# Patient Record
Sex: Female | Born: 1940 | Race: White | Hispanic: No | Smoking: Former smoker
Health system: Southern US, Community
[De-identification: ages and names within clinical notes are randomized; demographics above are authoritative.]

## PROBLEM LIST (undated history)

## (undated) DIAGNOSIS — I471 Supraventricular tachycardia, unspecified: Secondary | ICD-10-CM

## (undated) DIAGNOSIS — D72819 Decreased white blood cell count, unspecified: Secondary | ICD-10-CM

## (undated) DIAGNOSIS — I82409 Acute embolism and thrombosis of unspecified deep veins of unspecified lower extremity: Secondary | ICD-10-CM

## (undated) DIAGNOSIS — I2510 Atherosclerotic heart disease of native coronary artery without angina pectoris: Secondary | ICD-10-CM

## (undated) DIAGNOSIS — I350 Nonrheumatic aortic (valve) stenosis: Secondary | ICD-10-CM

## (undated) DIAGNOSIS — R55 Syncope and collapse: Secondary | ICD-10-CM

## (undated) DIAGNOSIS — G629 Polyneuropathy, unspecified: Secondary | ICD-10-CM

## (undated) DIAGNOSIS — D692 Other nonthrombocytopenic purpura: Secondary | ICD-10-CM

## (undated) DIAGNOSIS — F329 Major depressive disorder, single episode, unspecified: Secondary | ICD-10-CM

## (undated) DIAGNOSIS — G4730 Sleep apnea, unspecified: Secondary | ICD-10-CM

## (undated) DIAGNOSIS — IMO0001 Reserved for inherently not codable concepts without codable children: Secondary | ICD-10-CM

## (undated) DIAGNOSIS — R739 Hyperglycemia, unspecified: Secondary | ICD-10-CM

## (undated) DIAGNOSIS — J449 Chronic obstructive pulmonary disease, unspecified: Secondary | ICD-10-CM

## (undated) DIAGNOSIS — I615 Nontraumatic intracerebral hemorrhage, intraventricular: Secondary | ICD-10-CM

## (undated) DIAGNOSIS — C801 Malignant (primary) neoplasm, unspecified: Secondary | ICD-10-CM

## (undated) DIAGNOSIS — F32A Depression, unspecified: Secondary | ICD-10-CM

## (undated) DIAGNOSIS — H25019 Cortical age-related cataract, unspecified eye: Secondary | ICD-10-CM

## (undated) DIAGNOSIS — I10 Essential (primary) hypertension: Secondary | ICD-10-CM

## (undated) DIAGNOSIS — R609 Edema, unspecified: Secondary | ICD-10-CM

## (undated) DIAGNOSIS — D696 Thrombocytopenia, unspecified: Secondary | ICD-10-CM

## (undated) DIAGNOSIS — M1990 Unspecified osteoarthritis, unspecified site: Secondary | ICD-10-CM

## (undated) HISTORY — PX: EYE SURGERY: SHX253

## (undated) HISTORY — DX: Polyneuropathy, unspecified: G62.9

## (undated) HISTORY — DX: Other nonthrombocytopenic purpura: D69.2

## (undated) HISTORY — DX: Nonrheumatic aortic (valve) stenosis: I35.0

## (undated) HISTORY — PX: COLONOSCOPY: SHX174

## (undated) HISTORY — DX: Syncope and collapse: R55

## (undated) HISTORY — DX: Supraventricular tachycardia, unspecified: I47.10

## (undated) HISTORY — DX: Nontraumatic intracerebral hemorrhage, intraventricular: I61.5

## (undated) HISTORY — PX: BREAST BIOPSY: SHX20

## (undated) HISTORY — PX: BACK SURGERY: SHX140

## (undated) HISTORY — PX: CORONARY ANGIOPLASTY: SHX604

---

## 2004-07-08 ENCOUNTER — Inpatient Hospital Stay: Payer: Self-pay | Admitting: Cardiology

## 2004-07-08 ENCOUNTER — Other Ambulatory Visit: Payer: Self-pay

## 2004-08-03 ENCOUNTER — Encounter: Payer: Self-pay | Admitting: Cardiology

## 2004-08-10 ENCOUNTER — Ambulatory Visit: Payer: Self-pay | Admitting: Internal Medicine

## 2005-08-24 ENCOUNTER — Ambulatory Visit: Payer: Self-pay | Admitting: Internal Medicine

## 2006-09-13 ENCOUNTER — Ambulatory Visit: Payer: Self-pay | Admitting: Internal Medicine

## 2007-04-25 ENCOUNTER — Ambulatory Visit: Payer: Self-pay | Admitting: Internal Medicine

## 2007-05-24 ENCOUNTER — Ambulatory Visit: Payer: Self-pay | Admitting: Internal Medicine

## 2007-12-20 ENCOUNTER — Ambulatory Visit: Payer: Self-pay | Admitting: Internal Medicine

## 2009-01-09 ENCOUNTER — Ambulatory Visit: Payer: Self-pay | Admitting: Internal Medicine

## 2009-05-21 ENCOUNTER — Inpatient Hospital Stay: Payer: Self-pay | Admitting: Internal Medicine

## 2009-05-21 ENCOUNTER — Ambulatory Visit: Payer: Self-pay | Admitting: Internal Medicine

## 2010-04-22 ENCOUNTER — Ambulatory Visit: Payer: Self-pay | Admitting: Internal Medicine

## 2010-05-13 ENCOUNTER — Ambulatory Visit: Payer: Self-pay | Admitting: Internal Medicine

## 2011-06-22 ENCOUNTER — Ambulatory Visit: Payer: Self-pay | Admitting: Internal Medicine

## 2011-06-24 ENCOUNTER — Ambulatory Visit: Payer: Self-pay | Admitting: Internal Medicine

## 2012-01-02 ENCOUNTER — Ambulatory Visit: Payer: Self-pay | Admitting: General Surgery

## 2012-02-09 ENCOUNTER — Ambulatory Visit: Payer: Self-pay | Admitting: Cardiology

## 2012-02-09 LAB — PROTIME-INR: INR: 1

## 2012-06-28 ENCOUNTER — Ambulatory Visit: Payer: Self-pay | Admitting: Internal Medicine

## 2012-09-21 ENCOUNTER — Ambulatory Visit: Payer: Self-pay | Admitting: Gastroenterology

## 2012-11-09 ENCOUNTER — Ambulatory Visit: Payer: Self-pay | Admitting: Internal Medicine

## 2013-07-03 ENCOUNTER — Ambulatory Visit: Payer: Self-pay | Admitting: Internal Medicine

## 2013-08-02 ENCOUNTER — Ambulatory Visit: Payer: Self-pay | Admitting: Internal Medicine

## 2014-03-03 ENCOUNTER — Ambulatory Visit: Payer: Self-pay | Admitting: Internal Medicine

## 2014-04-17 ENCOUNTER — Emergency Department: Payer: Self-pay | Admitting: Emergency Medicine

## 2014-04-17 LAB — CBC
HCT: 46.8 % (ref 35.0–47.0)
HGB: 15.6 g/dL (ref 12.0–16.0)
MCH: 31.6 pg (ref 26.0–34.0)
MCHC: 33.2 g/dL (ref 32.0–36.0)
MCV: 95 fL (ref 80–100)
PLATELETS: 136 10*3/uL — AB (ref 150–440)
RBC: 4.92 10*6/uL (ref 3.80–5.20)
RDW: 12.8 % (ref 11.5–14.5)
WBC: 15.8 10*3/uL — ABNORMAL HIGH (ref 3.6–11.0)

## 2014-04-17 LAB — BASIC METABOLIC PANEL
Anion Gap: 11 (ref 7–16)
BUN: 16 mg/dL (ref 7–18)
CALCIUM: 9.4 mg/dL (ref 8.5–10.1)
CREATININE: 1.11 mg/dL (ref 0.60–1.30)
Chloride: 99 mmol/L (ref 98–107)
Co2: 25 mmol/L (ref 21–32)
EGFR (Non-African Amer.): 49 — ABNORMAL LOW
GFR CALC AF AMER: 57 — AB
Glucose: 142 mg/dL — ABNORMAL HIGH (ref 65–99)
Osmolality: 274 (ref 275–301)
Potassium: 3.7 mmol/L (ref 3.5–5.1)
Sodium: 135 mmol/L — ABNORMAL LOW (ref 136–145)

## 2014-04-21 ENCOUNTER — Inpatient Hospital Stay: Payer: Self-pay | Admitting: Internal Medicine

## 2014-04-21 LAB — COMPREHENSIVE METABOLIC PANEL
ALK PHOS: 63 U/L
ANION GAP: 7 (ref 7–16)
AST: 28 U/L (ref 15–37)
Albumin: 2.9 g/dL — ABNORMAL LOW (ref 3.4–5.0)
BILIRUBIN TOTAL: 0.4 mg/dL (ref 0.2–1.0)
BUN: 20 mg/dL — ABNORMAL HIGH (ref 7–18)
CALCIUM: 9.3 mg/dL (ref 8.5–10.1)
CREATININE: 1.18 mg/dL (ref 0.60–1.30)
Chloride: 103 mmol/L (ref 98–107)
Co2: 29 mmol/L (ref 21–32)
EGFR (Non-African Amer.): 45 — ABNORMAL LOW
GFR CALC AF AMER: 53 — AB
Glucose: 96 mg/dL (ref 65–99)
OSMOLALITY: 280 (ref 275–301)
POTASSIUM: 4.2 mmol/L (ref 3.5–5.1)
SGPT (ALT): 27 U/L
Sodium: 139 mmol/L (ref 136–145)
TOTAL PROTEIN: 7.2 g/dL (ref 6.4–8.2)

## 2014-04-21 LAB — URINALYSIS, COMPLETE
Bacteria: NONE SEEN
Bilirubin,UR: NEGATIVE
Blood: NEGATIVE
Glucose,UR: NEGATIVE mg/dL
Ketone: NEGATIVE
Leukocyte Esterase: NEGATIVE
Nitrite: NEGATIVE
Ph: 5
Protein: NEGATIVE
RBC,UR: <1 /HPF
Specific Gravity: 1.018
Squamous Epithelial: NONE SEEN
WBC UR: <1 /HPF

## 2014-04-21 LAB — CBC WITH DIFFERENTIAL/PLATELET
BASOS ABS: 0 10*3/uL (ref 0.0–0.1)
Basophil %: 0.6 %
EOS PCT: 1.9 %
Eosinophil #: 0.1 10*3/uL (ref 0.0–0.7)
HCT: 39.6 % (ref 35.0–47.0)
HGB: 13.2 g/dL (ref 12.0–16.0)
LYMPHS ABS: 1.4 10*3/uL (ref 1.0–3.6)
Lymphocyte %: 21.4 %
MCH: 32.3 pg (ref 26.0–34.0)
MCHC: 33.4 g/dL (ref 32.0–36.0)
MCV: 97 fL (ref 80–100)
Monocyte #: 0.8 x10 3/mm (ref 0.2–0.9)
Monocyte %: 11.9 %
NEUTROS PCT: 64.2 %
Neutrophil #: 4.3 10*3/uL (ref 1.4–6.5)
Platelet: 158 10*3/uL (ref 150–440)
RBC: 4.09 10*6/uL (ref 3.80–5.20)
RDW: 12.9 % (ref 11.5–14.5)
WBC: 6.7 10*3/uL (ref 3.6–11.0)

## 2014-04-21 LAB — PROTIME-INR
INR: 1.6
Prothrombin Time: 18.3 s — ABNORMAL HIGH

## 2014-04-22 LAB — CBC WITH DIFFERENTIAL/PLATELET
BASOS ABS: 0.1 10*3/uL (ref 0.0–0.1)
Basophil %: 0.9 %
EOS ABS: 0.2 10*3/uL (ref 0.0–0.7)
EOS PCT: 2.3 %
HCT: 39.7 % (ref 35.0–47.0)
HGB: 13.5 g/dL (ref 12.0–16.0)
LYMPHS ABS: 1.6 10*3/uL (ref 1.0–3.6)
LYMPHS PCT: 24.1 %
MCH: 32.6 pg (ref 26.0–34.0)
MCHC: 34 g/dL (ref 32.0–36.0)
MCV: 96 fL (ref 80–100)
MONO ABS: 0.8 x10 3/mm (ref 0.2–0.9)
MONOS PCT: 11.8 %
Neutrophil #: 4.1 10*3/uL (ref 1.4–6.5)
Neutrophil %: 60.9 %
Platelet: 151 10*3/uL (ref 150–440)
RBC: 4.14 10*6/uL (ref 3.80–5.20)
RDW: 12.6 % (ref 11.5–14.5)
WBC: 6.8 10*3/uL (ref 3.6–11.0)

## 2014-04-22 LAB — BASIC METABOLIC PANEL
ANION GAP: 10 (ref 7–16)
BUN: 16 mg/dL (ref 7–18)
CREATININE: 1.14 mg/dL (ref 0.60–1.30)
Calcium, Total: 9.6 mg/dL (ref 8.5–10.1)
Chloride: 101 mmol/L (ref 98–107)
Co2: 27 mmol/L (ref 21–32)
GFR CALC AF AMER: 55 — AB
GFR CALC NON AF AMER: 47 — AB
Glucose: 120 mg/dL — ABNORMAL HIGH (ref 65–99)
Osmolality: 278 (ref 275–301)
POTASSIUM: 4.2 mmol/L (ref 3.5–5.1)
Sodium: 138 mmol/L (ref 136–145)

## 2014-04-23 LAB — CBC WITH DIFFERENTIAL/PLATELET
BASOS ABS: 0 10*3/uL (ref 0.0–0.1)
Basophil %: 0.6 %
Eosinophil #: 0.2 10*3/uL (ref 0.0–0.7)
Eosinophil %: 2.7 %
HCT: 41 % (ref 35.0–47.0)
HGB: 14.1 g/dL (ref 12.0–16.0)
LYMPHS ABS: 1.5 10*3/uL (ref 1.0–3.6)
LYMPHS PCT: 23.4 %
MCH: 32.7 pg (ref 26.0–34.0)
MCHC: 34.3 g/dL (ref 32.0–36.0)
MCV: 95 fL (ref 80–100)
Monocyte #: 0.7 x10 3/mm (ref 0.2–0.9)
Monocyte %: 10.1 %
NEUTROS ABS: 4.1 10*3/uL (ref 1.4–6.5)
Neutrophil %: 63.2 %
PLATELETS: 166 10*3/uL (ref 150–440)
RBC: 4.31 10*6/uL (ref 3.80–5.20)
RDW: 12.9 % (ref 11.5–14.5)
WBC: 6.5 10*3/uL (ref 3.6–11.0)

## 2014-04-23 LAB — BASIC METABOLIC PANEL
ANION GAP: 10 (ref 7–16)
BUN: 13 mg/dL (ref 7–18)
Calcium, Total: 9.3 mg/dL (ref 8.5–10.1)
Chloride: 106 mmol/L (ref 98–107)
Co2: 26 mmol/L (ref 21–32)
Creatinine: 0.93 mg/dL (ref 0.60–1.30)
EGFR (African American): >60
Glucose: 122 mg/dL — ABNORMAL HIGH (ref 65–99)
OSMOLALITY: 285 (ref 275–301)
Potassium: 4.1 mmol/L (ref 3.5–5.1)
SODIUM: 142 mmol/L (ref 136–145)

## 2014-04-23 LAB — PROTIME-INR
INR: 1.3
Prothrombin Time: 15.7 secs — ABNORMAL HIGH (ref 11.5–14.7)

## 2014-04-24 LAB — PROTIME-INR
INR: 1.4
PROTHROMBIN TIME: 16.6 s — AB (ref 11.5–14.7)

## 2014-04-26 LAB — CULTURE, BLOOD (SINGLE)

## 2014-12-20 NOTE — H&P (Signed)
PATIENT NAME:  Carol Riley, Carol Riley MR#:  [id number] DATE OF BIRTH:  04/11/1941  DATE OF ADMISSION:  04/21/2014  CHIEF COMPLAINT: Left leg cellulitis.  HISTORY OF PRESENT ILLNESS: A 74 year old female with a history of degenerative disk disease, coronary artery disease, hyperglycemia, history of prior DVT on Coumadin therapy, suffered a cat bite on Wednesday of last week. She was evaluated in the Emergency Room, they elected not to pursue rabies vaccinations as the cat was under observation and animal control has been activated and they are trying to catch the animal. She felt like she might have stepped on the cat as she was backing up trying to feed it. She has been on Augmentin, unfortunately, her leg has worsened with increasing redness, swelling, pain. On evaluation, the leg is quite swollen, red with purulent discharge from the side. She is admitted now with left lower extremity cellulitis left leg secondary to cat bite, which has failed outpatient antibiotics.   PAST MEDICAL HISTORY:  1.  Degenerative disk disease.  2.  History of cervical cancer, status post hysterectomy.  3.  Coronary artery disease with prior PCI.  4.  Chronic leukopenia and thrombocytopenia.  5.  Anxiety and depression.  6.  Hyperglycemia.  7.  Mild sleep apnea.  8.  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 Chronic Coumadin has been recommended.  9.  Lichen sclerosus versus lichen planus. 10.  Hypertension.  11.  Peripheral vascular disease.  12.  Status post appendectomy.  13.  History of back surgery.   ALLERGIES: PNEUMOVAX.   MEDICATIONS: 1.  Coumadin 7 mg p.o. daily.  2.  Lisinopril 40 mg p.o. daily.  3.  Neurontin 100 mg p.o. b.i.d.  4.  Citalopram 20 mg p.o. daily.  5.  Multivitamin 1 p.o. daily.  6.  Simvastatin 20 mg p.o. daily.  7.  Aspirin 81 mg p.o. daily.  8.  Vitamin D 1000 units p.o. daily.  9.  Toprol-XL 25 mg p.o. daily.  10.  Clobetasol cream topically b.i.d.  p.r.n.   SOCIAL HISTORY: Remote tobacco. No alcohol.   FAMILY HISTORY: Coronary artery disease, prostate cancer, coronary artery disease.   REVIEW OF SYSTEMS: Please see HPI. Some chills. No night sweats. No headache, chest pain, shortness of breath. Remainder of complete review of systems is negative.   PHYSICAL EXAMINATION:  VITAL SIGNS: Temperature 98.6, pulse 85, blood pressure 128/60, weight 191 pounds, saturation 98% on room air.  GENERAL: Well-developed, well-nourished female, appears uncomfortable.  EYES: Pupils round and reactive to light. Lids and conjunctivae unremarkable. EARS, NOSE AND THROAT: External examination unremarkable. The oropharynx moist without lesions.  NECK: Supple. Trachea midline. No thyromegaly.  CARDIOVASCULAR: Regular rate and rhythm without gallops or rubs.  LUNGS: Clear bilaterally without wheeze or retractions.  ABDOMEN: Soft, nontender, positive bowel sounds. No guard or rebound.  SKIN: Erythema all on the left lower extremity with puncture marks on the lateral and medial aspect of the lower leg, approximately midway down. LYMPH NODES: No cervical or supraclavicular inguinal nodes.  NEUROLOGIC: Cranial nerves intact. Antalgic gait.  EXTREMITIES: Edema 2 to 3+ into the foot with good capillary refill. Touch intact in the foot.   IMPRESSION AND PLAN:  1.  Left lower extremity cellulitis secondary to cat bite, refractory to Augmentin. Place on vancomycin, Zosyn. CT of the area to evaluate for possible abscess. Consider surgical consultation. ID consultation. Further therapy based on the above.  2.  History of deep vein thrombosis. We  see what her INR looks like, she will hold Coumadin and likely institute Lovenox in the event that she does need incision and drainage.  3.  Coronary artery disease. Continue beta blocker, hold aspirin for the reasons as above. Continue ACE inhibitor and follow blood pressure, heart rate. Continue cholesterol medication as well.     ____________________________ Adin Hector, MD bjk:TT D: 04/21/2014 16:25:48 ET T: 04/21/2014 16:49:06 ET JOB#: [id number]  cc: Tama High III, MD, <Dictator> Ramonita Lab MD ELECTRONICALLY SIGNED 04/23/2014 14:06

## 2014-12-20 NOTE — Discharge Summary (Signed)
PATIENT NAME:  Carol Riley, RICHWINE MR#:  [id number] DATE OF BIRTH:  [birth year]  DATE OF ADMISSION:  04/21/2014 DATE OF DISCHARGE:  04/24/2014   FINAL DIAGNOSES:  1.  Left lower extremity cellulitis secondary to cat bite, refractory to oral antibiotics.  2.  Hypertension.  3.  Degenerative disk disease.  4.  Coronary artery disease.  5.  Chronic leukopenia and thrombocytopenia.  6.  Anxiety with depression.  7.  History of hyperglycemia.  8.  History of deep vein thrombosis on chronic Coumadin therapy.  9.  Peripheral vascular disease.   HISTORY AND PHYSICAL: Please see dictated admission history and physical.   HOSPITAL COURSE: The patient was admitted with left lower extremity cellulitis, which had progressed through oral Augmentin as an outpatient. She was placed on vancomycin and Zosyn. She underwent CT scan, which showed no evidence of abscess, no evidence of osteomyelitis. Blood cultures were obtained, which were negative. No significant drainage from the wound could be cultured. Infectious disease saw the patient, and she was continued on 48 hours of IV antibiotics with significant improvement, and then was changed over to Augmentin with Septra. She was observed on this regimen for 24 hours, and had no fevers, with continued improvement in erythema and decreased pain.    Physical therapy ambulated the patient; she did reasonably well with a walker, though they felt that she might benefit from outpatient physical therapy to improve strength and balance. She preferred to do this once the leg was a bit more improved. So, at this point, she will be discharged home in stable condition with physical activity being up as tolerated with a walker, elevating the leg when she is lying and sitting. She will follow-up in our office on the next 4-5 days. She will contact us immediately for any worsening signs of fevers, chills, infection.   DISCHARGE MEDICATIONS:  1.  Citalopram 20 mg p.o. daily.  2.   Simvastatin 20 mg p.o. at bedtime.  3.  Multivitamin 1 p.o. daily.  4.  Toprol-XL 25 mg 1/2 tablet p.o. daily.  5.  Aspirin 81 mg p.o. daily.  6.  Augmentin 875 mg 1 p.o. b.i.d. x 10 days.  7.  Coumadin 5 and 2 mg pills to be combined for a total of 7 mg daily.  8.  Norco 5/325 mg 1 p.o. every 6 hours p.r.n. moderate pain.  9.  Lisinopril 40 mg p.o. daily, dose increased.  10.  Bactrim DS 1 p.o. b.i.d. x 10 days.    ____________________________ Adin Hector, MD bjk:MT D: 04/24/2014 08:12:22 ET T: 04/24/2014 11:52:32 ET JOB#: [id number]  cc: Adin Hector, MD, <Dictator> Ramonita Lab MD ELECTRONICALLY SIGNED 04/28/2014 7:59

## 2014-12-20 NOTE — Consult Note (Signed)
PATIENT NAME:  Carol Riley, Carol Riley MR#:  [id number] DATE OF BIRTH:  02-Oct-1940  DATE OF CONSULTATION:  04/22/2014  REQUESTING PHYSICIAN:   Ramonita Lab, MD   CONSULTING PHYSICIAN:  Cheral Marker. Ola Spurr, MD  REASON FOR CONSULTATION:  Cellulitis.  HISTORY OF PRESENT ILLNESS: A 74 year old female with history of coronary artery disease, hyperlipidemia, prior deep vein thrombosis who had a cat bite 6 days ago.  This was a Psychologist, counselling that she was feeding.  She called animal control and they have the cat in quarantine.   She was seen in the ER and started on Augmentin, however, the swelling had increased and redness. She was having some drainage from the side of the bite as well.  She was admitted 08/24 for IV antibiotics.  Since then, she has been on vancomycin and Zosyn and has elevated the leg and has had some improvement. We are consulted for further antibiotic management.   PAST MEDICAL HISTORY:  Coronary artery disease, chronic leukopenia and thrombocytopenia, anxiety, depression, hyperglycemia, sleep apnea, prior deep vein thrombosis on chronic Coumadin, hypertension, peripheral vascular disease, status post appendectomy, history of back surgery, degenerative disk disease, cervical cancer, status post hysterectomy.   SOCIAL HISTORY: Remote tobacco. No alcohol.    FAMILY HISTORY: Noncontributory.   REVIEW OF SYSTEMS:  An 11 systems reviewed and negative except as per history of present illness.   ALLERGIES: PNEUMOVAX.   ANTIBIOTICS SINCE ADMISSION:  Vancomycin and Zosyn.  Other medications are reviewed.   PHYSICAL EXAMINATION:  VITAL SIGNS: Temperature 98.4, pulse 56, blood pressure 128/64, respirations 17, sat 93% on room air.  GENERAL: She is obese, lying in bed, in no acute distress.  HEENT: Pupils equal, round and reactive to light and accommodation.  Extraocular movements are intact. Sclerae are anicteric.  OROPHARYNX: Clear.  NECK: Supple.  HEART: Regular.  LUNGS: Clear.  ABDOMEN:  Soft, nontender, nondistended. No hepatosplenomegaly.  EXTREMITIES: Her left lower extremity has 2+ edema.  There is erythema that is receding from the line drawn yesterday afternoon.  There are puncture marks on the lateral aspect of the lower leg. This is somewhat necrotic-appearing, but is small and has no drainage.  NEUROLOGIC:  She is alert and oriented x 3. Grossly nonfocal neuro exam.   LABORATORY DATA: White blood count on admission was 6.7.  On August 20th it was 15.8, currently 6.8, hemoglobin 13.5, platelets 151.  Blood cultures x 2 are no growth to date. Urinalysis done on admission was negative.  Renal function is normal with a creatinine of 1.18, albumin 2.9.  Other LFTs are normal.    IMAGING: Tibia-fibula x-ray August 20th showed no acute osseous abnormalities. CT done August 24th showed diffuse subcutaneous edema compatible with diagnosis of cellulitis, but no abscess and no evidence of osteomyelitis.   IMPRESSION: A 74 year old female with history of recent cat bite 6 days ago. She was initially treated with Augmentin but progressed.  She has now been on vancomycin and Zosyn since admission and has had some clinical improvement.  Her white blood count is normal. She has not had a fever since admission.  Infection is receding from the line drawn yesterday, although there is marked erythema. There is no evidence of osteomyelitis or abscess on the CT scan.   Her wound infection and cellulitis resulting from the cat bite is likely polymicrobial.  Her blood cultures were negative to date.    RECOMMENDATION:   1.  At this point, I would continue the vancomycin and Zosyn.  If it continues to improve, I would switch her tomorrow and monitor her for 24-hours on oral antibiotics, including Augmentin and Bactrim.  Would recommend at least a 7 day course of this treatment.  I can see her in follow up as an outpatient if needed.  2.  I would recommend elevating the leg with 4 to 6 pillows to help  decrease the edema.   Thank you for the consult. Be glad to follow with you.  ____________________________ Cheral Marker. Ola Spurr, MD dpf:DT D: 04/22/2014 17:18:30 ET T: 04/22/2014 18:29:26 ET JOB#: [id number]  cc: Cheral Marker. Ola Spurr, MD, <Dictator> Terik Haughey Ola Spurr MD ELECTRONICALLY SIGNED 04/27/2014 22:24

## 2015-04-14 ENCOUNTER — Other Ambulatory Visit: Payer: Self-pay | Admitting: Internal Medicine

## 2015-04-15 ENCOUNTER — Other Ambulatory Visit: Payer: Self-pay | Admitting: Internal Medicine

## 2015-04-15 DIAGNOSIS — Z1231 Encounter for screening mammogram for malignant neoplasm of breast: Secondary | ICD-10-CM

## 2015-04-16 ENCOUNTER — Other Ambulatory Visit: Payer: Self-pay | Admitting: Internal Medicine

## 2015-04-16 ENCOUNTER — Ambulatory Visit
Admission: RE | Admit: 2015-04-16 | Discharge: 2015-04-16 | Disposition: A | Discharge status: Home / Self Care | Payer: Medicare Other | Source: Ambulatory Visit | Attending: Internal Medicine | Admitting: Internal Medicine

## 2015-04-16 DIAGNOSIS — Z1231 Encounter for screening mammogram for malignant neoplasm of breast: Secondary | ICD-10-CM | POA: Diagnosis present

## 2015-04-16 HISTORY — DX: Malignant (primary) neoplasm, unspecified: C80.1

## 2015-04-30 ENCOUNTER — Encounter: Payer: Self-pay | Admitting: *Deleted

## 2015-05-06 ENCOUNTER — Ambulatory Visit: Payer: Medicare Other | Admitting: Anesthesiology

## 2015-05-06 ENCOUNTER — Encounter
Admission: RE | Disposition: A | Discharge status: Home / Self Care | Payer: Self-pay | Source: Ambulatory Visit | Attending: Ophthalmology

## 2015-05-06 ENCOUNTER — Ambulatory Visit
Admission: RE | Admit: 2015-05-06 | Discharge: 2015-05-06 | Disposition: A | Discharge status: Home / Self Care | Payer: Medicare Other | Source: Ambulatory Visit | Attending: Ophthalmology | Admitting: Ophthalmology

## 2015-05-06 ENCOUNTER — Encounter: Payer: Self-pay | Admitting: *Deleted

## 2015-05-06 DIAGNOSIS — J449 Chronic obstructive pulmonary disease, unspecified: Secondary | ICD-10-CM | POA: Insufficient documentation

## 2015-05-06 DIAGNOSIS — Z8541 Personal history of malignant neoplasm of cervix uteri: Secondary | ICD-10-CM | POA: Diagnosis not present

## 2015-05-06 DIAGNOSIS — Z955 Presence of coronary angioplasty implant and graft: Secondary | ICD-10-CM | POA: Insufficient documentation

## 2015-05-06 DIAGNOSIS — R0602 Shortness of breath: Secondary | ICD-10-CM | POA: Diagnosis not present

## 2015-05-06 DIAGNOSIS — H35341 Macular cyst, hole, or pseudohole, right eye: Secondary | ICD-10-CM | POA: Diagnosis present

## 2015-05-06 DIAGNOSIS — Z9842 Cataract extraction status, left eye: Secondary | ICD-10-CM | POA: Diagnosis not present

## 2015-05-06 DIAGNOSIS — Z86718 Personal history of other venous thrombosis and embolism: Secondary | ICD-10-CM | POA: Diagnosis not present

## 2015-05-06 DIAGNOSIS — E780 Pure hypercholesterolemia: Secondary | ICD-10-CM | POA: Insufficient documentation

## 2015-05-06 DIAGNOSIS — Z87891 Personal history of nicotine dependence: Secondary | ICD-10-CM | POA: Insufficient documentation

## 2015-05-06 DIAGNOSIS — F329 Major depressive disorder, single episode, unspecified: Secondary | ICD-10-CM | POA: Insufficient documentation

## 2015-05-06 DIAGNOSIS — M1990 Unspecified osteoarthritis, unspecified site: Secondary | ICD-10-CM | POA: Insufficient documentation

## 2015-05-06 DIAGNOSIS — M7989 Other specified soft tissue disorders: Secondary | ICD-10-CM | POA: Diagnosis not present

## 2015-05-06 DIAGNOSIS — G4730 Sleep apnea, unspecified: Secondary | ICD-10-CM | POA: Diagnosis not present

## 2015-05-06 HISTORY — DX: Acute embolism and thrombosis of unspecified deep veins of unspecified lower extremity: I82.409

## 2015-05-06 HISTORY — DX: Sleep apnea, unspecified: G47.30

## 2015-05-06 HISTORY — DX: Unspecified osteoarthritis, unspecified site: M19.90

## 2015-05-06 HISTORY — PX: MEMBRANE PEEL: SHX5967

## 2015-05-06 HISTORY — DX: Chronic obstructive pulmonary disease, unspecified: J44.9

## 2015-05-06 HISTORY — PX: PARS PLANA VITRECTOMY: SHX2166

## 2015-05-06 HISTORY — DX: Atherosclerotic heart disease of native coronary artery without angina pectoris: I25.10

## 2015-05-06 HISTORY — DX: Polyneuropathy, unspecified: G62.9

## 2015-05-06 HISTORY — DX: Reserved for inherently not codable concepts without codable children: IMO0001

## 2015-05-06 HISTORY — DX: Depression, unspecified: F32.A

## 2015-05-06 HISTORY — DX: Major depressive disorder, single episode, unspecified: F32.9

## 2015-05-06 HISTORY — PX: GAS INSERTION: SHX5336

## 2015-05-06 HISTORY — DX: Essential (primary) hypertension: I10

## 2015-05-06 HISTORY — DX: Edema, unspecified: R60.9

## 2015-05-06 SURGERY — PARS PLANA VITRECTOMY WITH 23 GAUGE
Anesthesia: Monitor Anesthesia Care | Site: Eye | Laterality: Right | Wound class: Clean

## 2015-05-06 MED ORDER — OXYCODONE HCL 5 MG/5ML PO SOLN: 5.0000 mg | Freq: Once | ORAL | Status: DC | PRN

## 2015-05-06 MED ORDER — SODIUM CHLORIDE 0.9 % IV SOLN
INTRAVENOUS | Status: DC
Start: 2015-05-06 — End: 2015-05-06
  Administered 2015-05-06: 10:00:00 via INTRAVENOUS

## 2015-05-06 MED ORDER — TETRACAINE HCL 0.5 % OP SOLN
OPHTHALMIC | Status: AC
  Filled 2015-05-06: qty 2

## 2015-05-06 MED ORDER — LIDOCAINE HCL (PF) 4 % IJ SOLN
INTRAMUSCULAR | Status: DC | PRN
  Administered 2015-05-06: 10:00:00 via OPHTHALMIC

## 2015-05-06 MED ORDER — BSS PLUS IO SOLN
Freq: Once | INTRAOCULAR | Status: DC
  Filled 2015-05-06: qty 500

## 2015-05-06 MED ORDER — CEFUROXIME OPHTHALMIC INJECTION 1 MG/0.1 ML
INJECTION | OPHTHALMIC | Status: AC
  Filled 2015-05-06: qty 0.1

## 2015-05-06 MED ORDER — ALFENTANIL 500 MCG/ML IJ INJ
INJECTION | INTRAMUSCULAR | Status: DC | PRN
  Administered 2015-05-06: 500 ug via INTRAVENOUS

## 2015-05-06 MED ORDER — BSS PLUS IO SOLN
INTRAOCULAR | Status: DC | PRN
  Administered 2015-05-06: 1 via INTRAOCULAR

## 2015-05-06 MED ORDER — FENTANYL CITRATE (PF) 100 MCG/2ML IJ SOLN: 25.0000 ug | INTRAMUSCULAR | Status: DC | PRN

## 2015-05-06 MED ORDER — CYCLOPENTOLATE HCL 2 % OP SOLN
OPHTHALMIC | Status: AC
  Filled 2015-05-06: qty 2

## 2015-05-06 MED ORDER — CEFUROXIME OPHTHALMIC INJECTION 1 MG/0.1 ML
INJECTION | OPHTHALMIC | Status: DC | PRN
Start: 2015-05-06 — End: 2015-05-06
  Administered 2015-05-06: 0.1 mL via INTRACAMERAL

## 2015-05-06 MED ORDER — HYALURONIDASE HUMAN 150 UNIT/ML IJ SOLN
INTRAMUSCULAR | Status: AC
  Filled 2015-05-06: qty 1

## 2015-05-06 MED ORDER — INDOCYANINE GREEN 25 MG IV SOLR
25.0000 mg | Freq: Once | INTRAVENOUS | Status: AC
  Administered 2015-05-06: 25 mg via OPHTHALMIC
  Filled 2015-05-06: qty 25

## 2015-05-06 MED ORDER — NEOMYCIN-POLYMYXIN-DEXAMETH 3.5-10000-0.1 OP OINT
TOPICAL_OINTMENT | OPHTHALMIC | Status: AC
  Filled 2015-05-06: qty 3.5

## 2015-05-06 MED ORDER — HYPROMELLOSE 0.3 % OP GEL
OPHTHALMIC | Status: DC | PRN
  Administered 2015-05-06: 1 via OPHTHALMIC

## 2015-05-06 MED ORDER — ATROPINE SULFATE 1 % OP SOLN
OPHTHALMIC | Status: AC
  Filled 2015-05-06: qty 5

## 2015-05-06 MED ORDER — PHENYLEPHRINE HCL 10 % OP SOLN
OPHTHALMIC | Status: AC
  Filled 2015-05-06: qty 5

## 2015-05-06 MED ORDER — LIDOCAINE HCL (PF) 4 % IJ SOLN
INTRAMUSCULAR | Status: AC
  Filled 2015-05-06: qty 5

## 2015-05-06 MED ORDER — MIDAZOLAM HCL 5 MG/5ML IJ SOLN
INTRAMUSCULAR | Status: DC | PRN
  Administered 2015-05-06: 1 mg via INTRAVENOUS

## 2015-05-06 MED ORDER — NEOMYCIN-POLYMYXIN-DEXAMETH 0.1 % OP OINT
TOPICAL_OINTMENT | OPHTHALMIC | Status: DC | PRN
  Administered 2015-05-06: 1 via OPHTHALMIC

## 2015-05-06 MED ORDER — DEXAMETHASONE SODIUM PHOSPHATE 10 MG/ML IJ SOLN
INTRAMUSCULAR | Status: AC
  Filled 2015-05-06: qty 1

## 2015-05-06 MED ORDER — CYCLOPENTOLATE HCL 2 % OP SOLN
1.0000 [drp] | OPHTHALMIC | Status: DC | PRN
Start: 2015-05-06 — End: 2015-05-06
  Administered 2015-05-06: 1 [drp] via OPHTHALMIC

## 2015-05-06 MED ORDER — OXYCODONE HCL 5 MG PO TABS: 5.0000 mg | ORAL_TABLET | Freq: Once | ORAL | Status: DC | PRN

## 2015-05-06 MED ORDER — HYPROMELLOSE 0.3 % OP GEL
OPHTHALMIC | Status: AC
  Filled 2015-05-06: qty 3.5

## 2015-05-06 MED ORDER — ATROPINE SULFATE 1 % OP SOLN
OPHTHALMIC | Status: DC | PRN
  Administered 2015-05-06: 2 [drp] via OPHTHALMIC

## 2015-05-06 MED ORDER — DEXAMETHASONE SODIUM PHOSPHATE 10 MG/ML IJ SOLN
INTRAMUSCULAR | Status: DC | PRN
  Administered 2015-05-06: 10 mg

## 2015-05-06 MED ORDER — BUPIVACAINE HCL (PF) 0.75 % IJ SOLN
INTRAMUSCULAR | Status: AC
  Filled 2015-05-06: qty 10

## 2015-05-06 MED ORDER — PHENYLEPHRINE HCL 10 % OP SOLN
1.0000 [drp] | OPHTHALMIC | Status: DC | PRN
Start: 2015-05-06 — End: 2015-05-06
  Administered 2015-05-06: 1 [drp] via OPHTHALMIC

## 2015-05-06 SURGICAL SUPPLY — 39 items
APPLICATOR COTTON TIP 6IN STRL (MISCELLANEOUS) ×24 IMPLANT
CANNULA SOFT TIP 25G (CANNULA) ×2 IMPLANT
CORD BIP STRL DISP 12FT (MISCELLANEOUS) IMPLANT
CUP MEDICINE 2OZ PLAST GRAD ST (MISCELLANEOUS) ×4 IMPLANT
ERASER HMR WETFIELD 25G (MISCELLANEOUS) IMPLANT
FILTER MILLEX .045 (MISCELLANEOUS) IMPLANT
FLTR MILLEX .045 (MISCELLANEOUS) ×4
FORCEPS GRASP END 23G (INSTRUMENTS) IMPLANT
FORCEPS GRIESH GRASP 25G (INSTRUMENTS) ×2 IMPLANT
GAS TANK SF6 (MISCELLANEOUS) ×2 IMPLANT
GLOVE BIO SURGEON STRL SZ8 (GLOVE) ×4 IMPLANT
GLOVE SURG LX 6.5 MICRO (GLOVE) ×2
GLOVE SURG LX STRL 6.5 MICRO (GLOVE) ×2 IMPLANT
GOWN STRL REUS W/ TWL LRG LVL3 (GOWN DISPOSABLE) ×4 IMPLANT
GOWN STRL REUS W/TWL LRG LVL3 (GOWN DISPOSABLE) ×8
IV D5W 50ML SINGLE PACK (IV SOLUTION) ×2 IMPLANT
IV SET STOPCOCK EXT 40 2IN (SET/KITS/TRAYS/PACK) ×2 IMPLANT
LENS BIOM OPTIC SET 200MM DISP (MISCELLANEOUS) ×4 IMPLANT
LENS VITRECTOMY FLAT DISP (MISCELLANEOUS) IMPLANT
NDL FILTER BLUNT 18X1 1/2 (NEEDLE) ×2 IMPLANT
NDL HYPO 30X.5 LL (NEEDLE) IMPLANT
NDL RETROBULBAR .5 NSTRL (NEEDLE) ×4 IMPLANT
NEEDLE FILTER BLUNT 18X 1/2SAF (NEEDLE) ×2
NEEDLE FILTER BLUNT 18X1 1/2 (NEEDLE) ×2 IMPLANT
NEEDLE HYPO 30X.5 LL (NEEDLE) ×4 IMPLANT
PACK EYE AFTER SURG (MISCELLANEOUS) ×4 IMPLANT
PACK VITRECTOMY (MISCELLANEOUS) ×4 IMPLANT
PACK VITRECTOMY CASSETTE 23GA (MISCELLANEOUS) ×4 IMPLANT
PROBE LASER DIRECT 23G (MISCELLANEOUS) IMPLANT
PROBE LASER ILLUM FLEX CVD 23G (OPHTHALMIC) IMPLANT
SOL ANTI-FOG 6CC FOG-OUT (MISCELLANEOUS) IMPLANT
SOL FOG-OUT ANTI-FOG 6CC (MISCELLANEOUS) ×2
SOL PREP PVP 2OZ (MISCELLANEOUS) ×4
SOLUTION PREP PVP 2OZ (MISCELLANEOUS) ×2 IMPLANT
STRAP SAFETY BODY (MISCELLANEOUS) ×4 IMPLANT
SUT VICRYL 7 0 TG140 8 (SUTURE) IMPLANT
SYR 50ML LL SCALE MARK (SYRINGE) ×2 IMPLANT
SYR TB 1ML 27GX1/2 LL (SYRINGE) ×2 IMPLANT
SYRINGE 10CC LL (SYRINGE) ×4 IMPLANT

## 2015-05-06 NOTE — Discharge Instructions (Addendum)
AMBULATORY SURGERY  °DISCHARGE INSTRUCTIONS ° ° °1) The drugs that you were given will stay in your system until tomorrow so for the next 24 hours you should not: ° °A) Drive an automobile °B) Make any legal decisions °C) Drink any alcoholic beverage ° ° °2) You may resume regular meals tomorrow.  Today it is better to start with liquids and gradually work up to solid foods. ° °You may eat anything you prefer, but it is better to start with liquids, then soup and crackers, and gradually work up to solid foods. ° ° °3) Please notify your doctor immediately if you have any unusual bleeding, trouble breathing, redness and pain at the surgery site, drainage, fever, or pain not relieved by medication. ° ° ° °4) Additional Instructions: ° ° ° ° ° ° ° °Please contact your physician with any problems or Same Day Surgery at [phone], Monday through Friday 6 am to 4 pm, or [place] [facility] [place] Main number at [phone].AMBULATORY SURGERY  °DISCHARGE INSTRUCTIONS ° ° °5) The drugs that you were given will stay in your system until tomorrow so for the next 24 hours you should not: ° °D) Drive an automobile °E) Make any legal decisions °F) Drink any alcoholic beverage ° ° °6) You may resume regular meals tomorrow.  Today it is better to start with liquids and gradually work up to solid foods. ° °You may eat anything you prefer, but it is better to start with liquids, then soup and crackers, and gradually work up to solid foods. ° ° °7) Please notify your doctor immediately if you have any unusual bleeding, trouble breathing, redness and pain at the surgery site, drainage, fever, or pain not relieved by medication. ° ° ° °8) Additional Instructions: ° ° ° ° ° ° ° °Please contact your physician with any problems or Same Day Surgery at [phone], Monday through Friday 6 am to 4 pm, or [place] [facility] [place] Main number at [phone]. °

## 2015-05-06 NOTE — Anesthesia Postprocedure Evaluation (Signed)
  Anesthesia Post-op Note  Patient: Carol Riley  Procedure(s) Performed: Procedure(s): PARS PLANA VITRECTOMY WITH 23 GAUGE (Right) MEMBRANE PEEL (Right) INSERTION OF GAS (Right)  Anesthesia type:MAC  Patient location: PACU  Post pain: Pain level controlled  Post assessment: Post-op Vital signs reviewed, Patient's Cardiovascular Status Stable, Respiratory Function Stable, Patent Airway and No signs of Nausea or vomiting  Post vital signs: Reviewed and stable  Last Vitals:  Filed Vitals:   05/06/15 1149  BP: 128/76  Pulse: 71  Temp:   Resp:     Level of consciousness: awake, alert  and patient cooperative  Complications: No apparent anesthesia complications

## 2015-05-06 NOTE — Transfer of Care (Signed)
Immediate Anesthesia Transfer of Care Note  Patient: Carol Riley  Procedure(s) Performed: Procedure(s): PARS PLANA VITRECTOMY WITH 23 GAUGE (Right) MEMBRANE PEEL (Right) INSERTION OF GAS (Right)  Patient Location: PACU  Anesthesia Type:MAC  Level of Consciousness: awake, alert , oriented and patient cooperative  Airway & Oxygen Therapy: Patient Spontanous Breathing  Post-op Assessment: Report given to RN, Post -op Vital signs reviewed and stable and Patient moving all extremities X 4  Post vital signs: Reviewed and stable  Last Vitals:  Filed Vitals:   05/06/15 1052  BP: 123/56  Pulse: 63  Temp: 36.9 C  Resp: 17    Complications: No apparent anesthesia complications

## 2015-05-06 NOTE — H&P (Signed)
.  Previous H&P scanned in reviewed, patient examined, and no interval changes.  Please see scanned record for complete information.   

## 2015-05-06 NOTE — Anesthesia Preprocedure Evaluation (Signed)
Anesthesia Evaluation  Patient identified by MRN, date of birth, ID band Patient awake    Reviewed: Allergy & Precautions, H&P , NPO status , Patient's Chart, lab work & pertinent test results  Airway Mallampati: III  TM Distance: >3 FB Neck ROM: limited    Dental no notable dental hx. (+) Partial Upper, Poor Dentition, Missing   Pulmonary neg shortness of breath, sleep apnea , COPD, former smoker,    Pulmonary exam normal breath sounds clear to auscultation       Cardiovascular Exercise Tolerance: Good hypertension, + CAD, + Past MI and + Cardiac Stents  Normal cardiovascular exam Rhythm:regular Rate:Normal     Neuro/Psych PSYCHIATRIC DISORDERS Depression negative neurological ROS     GI/Hepatic negative GI ROS, Neg liver ROS,   Endo/Other  negative endocrine ROS  Renal/GU negative Renal ROS  negative genitourinary   Musculoskeletal  (+) Arthritis ,   Abdominal   Peds  Hematology negative hematology ROS (+)   Anesthesia Other Findings Past Medical History:   Sleep apnea                                                  COPD (chronic obstructive pulmonary disease)                 Shortness of breath dyspnea                                  Coronary artery disease                                      Hypertension                                                 Neuropathy                                                   Arthritis                                                    Cancer                                                         Comment:cervical cancer   Depression                                                   DVT (deep venous thrombosis)  Edema                                                          Comment:FEET/LEGS   Reproductive/Obstetrics negative OB ROS                             Anesthesia Physical Anesthesia Plan  ASA:  III  Anesthesia Plan: MAC   Post-op Pain Management:    Induction:   Airway Management Planned:   Additional Equipment:   Intra-op Plan:   Post-operative Plan:   Informed Consent: I have reviewed the patients History and Physical, chart, labs and discussed the procedure including the risks, benefits and alternatives for the proposed anesthesia with the patient or authorized representative who has indicated his/her understanding and acceptance.   Dental Advisory Given  Plan Discussed with: Anesthesiologist, CRNA and Surgeon  Anesthesia Plan Comments:         Anesthesia Quick Evaluation

## 2015-05-06 NOTE — Op Note (Signed)
.  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  The risks, benefits, alternatives, and complications were discussed with the patient, and patient elected to proceed with pars plana vitrectomy.        PREOPERATIVE DIAGNOSIS: Macular hole, right eye.            POST OPERATIVE DIAGNOSIS: Macular hole, right eye.               OPERATION PERFORMED: 25-gauge pars plana vitrectomy with ICG, membrane peel, air-fluid exchange, and 24% SF6.                  ANESTHESIA: MAC with retrobulbar block.   COMPLICATIONS: None.     BLOOD LOSS: Minimal.   DESCRIPTION OF PROCEDURE: On the day of surgery, the patient was greeted in the preoperative holding area.  The right eye was marked.  The patient was then brought into the operating room and placed under monitored anesthesia care.  Next, 5 ml of a retrobulbar block consisting of 4% xylocaine plain, 0.75% sensorcaine plain and hylenex 100u was injected. The right eye was then prepped and draped in the usual sterile fashion.  Three trocars were used in the usual position. The infusion cannula was checked prior to starting the infusion to ensure it was in the correct position.  A core vitrectomy was performed. A PVD was present and so the vitreous was trimmed for 360 degrees to the periphery.   ICG was then applied to the retinal surface.  ICG forceps were used to peel the epiretinal membrane and internal limiting membrane for an area of two disc diameters around the fovea.  Attention was then drawn to the periphery.   A 360 degree scleral depression was performed.  There were no retinal tears or breaks. An air-fluid exchange was then performed and four times the vitreous volume of 24% SF6 was infused into the vitreous cavity.  The trocars were then removed.  The eye had an acceptable pressure by palpation.  The wounds were airtight.  Subconjunctival cefuroxime and dexamethasone was injected.  The eye was  then patched and shielded with Neo-Poly-Dex ointment and the patient was taken to the recovery area in stable condition.

## 2016-04-08 ENCOUNTER — Other Ambulatory Visit: Payer: Self-pay | Admitting: Internal Medicine

## 2016-04-08 DIAGNOSIS — Z1231 Encounter for screening mammogram for malignant neoplasm of breast: Secondary | ICD-10-CM

## 2016-04-19 ENCOUNTER — Other Ambulatory Visit: Payer: Self-pay | Admitting: Internal Medicine

## 2016-04-19 ENCOUNTER — Ambulatory Visit
Admission: RE | Admit: 2016-04-19 | Discharge: 2016-04-19 | Disposition: A | Discharge status: Home / Self Care | Payer: Medicare Other | Source: Ambulatory Visit | Attending: Internal Medicine | Admitting: Internal Medicine

## 2016-04-19 DIAGNOSIS — Z1231 Encounter for screening mammogram for malignant neoplasm of breast: Secondary | ICD-10-CM

## 2016-11-30 ENCOUNTER — Other Ambulatory Visit
Admission: RE | Admit: 2016-11-30 | Discharge: 2016-11-30 | Disposition: A | Discharge status: Home / Self Care | Payer: Medicare Other | Source: Ambulatory Visit | Attending: Gastroenterology | Admitting: Gastroenterology

## 2016-11-30 DIAGNOSIS — R197 Diarrhea, unspecified: Secondary | ICD-10-CM | POA: Insufficient documentation

## 2016-11-30 LAB — GASTROINTESTINAL PANEL BY PCR, STOOL (REPLACES STOOL CULTURE)
Adenovirus F40/41: NOT DETECTED
Astrovirus: NOT DETECTED
CAMPYLOBACTER SPECIES: NOT DETECTED
CRYPTOSPORIDIUM: NOT DETECTED
CYCLOSPORA CAYETANENSIS: NOT DETECTED
ENTEROTOXIGENIC E COLI (ETEC): NOT DETECTED
Entamoeba histolytica: NOT DETECTED
Enteroaggregative E coli (EAEC): NOT DETECTED
Enteropathogenic E coli (EPEC): NOT DETECTED
Giardia lamblia: NOT DETECTED
Norovirus GI/GII: NOT DETECTED
Plesimonas shigelloides: NOT DETECTED
Rotavirus A: NOT DETECTED
SALMONELLA SPECIES: NOT DETECTED
SAPOVIRUS (I, II, IV, AND V): NOT DETECTED
SHIGA LIKE TOXIN PRODUCING E COLI (STEC): NOT DETECTED
SHIGELLA/ENTEROINVASIVE E COLI (EIEC): NOT DETECTED
Vibrio cholerae: NOT DETECTED
Vibrio species: NOT DETECTED
YERSINIA ENTEROCOLITICA: NOT DETECTED

## 2016-11-30 LAB — C DIFFICILE QUICK SCREEN W PCR REFLEX
C DIFFICILE (CDIFF) TOXIN: NEGATIVE
C DIFFICLE (CDIFF) ANTIGEN: NEGATIVE
C Diff interpretation: NOT DETECTED

## 2017-02-24 ENCOUNTER — Ambulatory Visit
Admission: RE | Admit: 2017-02-24 | Discharge: 2017-02-24 | Disposition: A | Discharge status: Home / Self Care | Payer: Medicare Other | Source: Ambulatory Visit | Attending: Gastroenterology | Admitting: Gastroenterology

## 2017-02-24 ENCOUNTER — Ambulatory Visit: Payer: Medicare Other | Admitting: Anesthesiology

## 2017-02-24 ENCOUNTER — Encounter
Admission: RE | Disposition: A | Discharge status: Home / Self Care | Payer: Self-pay | Source: Ambulatory Visit | Attending: Gastroenterology

## 2017-02-24 ENCOUNTER — Encounter: Payer: Self-pay | Admitting: Anesthesiology

## 2017-02-24 DIAGNOSIS — M1990 Unspecified osteoarthritis, unspecified site: Secondary | ICD-10-CM | POA: Insufficient documentation

## 2017-02-24 DIAGNOSIS — J449 Chronic obstructive pulmonary disease, unspecified: Secondary | ICD-10-CM | POA: Insufficient documentation

## 2017-02-24 DIAGNOSIS — R197 Diarrhea, unspecified: Secondary | ICD-10-CM | POA: Diagnosis present

## 2017-02-24 DIAGNOSIS — K5730 Diverticulosis of large intestine without perforation or abscess without bleeding: Secondary | ICD-10-CM | POA: Diagnosis not present

## 2017-02-24 DIAGNOSIS — Z8541 Personal history of malignant neoplasm of cervix uteri: Secondary | ICD-10-CM | POA: Diagnosis not present

## 2017-02-24 DIAGNOSIS — Z7901 Long term (current) use of anticoagulants: Secondary | ICD-10-CM | POA: Insufficient documentation

## 2017-02-24 DIAGNOSIS — Z887 Allergy status to serum and vaccine status: Secondary | ICD-10-CM | POA: Diagnosis not present

## 2017-02-24 DIAGNOSIS — K640 First degree hemorrhoids: Secondary | ICD-10-CM | POA: Diagnosis not present

## 2017-02-24 DIAGNOSIS — K529 Noninfective gastroenteritis and colitis, unspecified: Secondary | ICD-10-CM | POA: Diagnosis not present

## 2017-02-24 DIAGNOSIS — Z79899 Other long term (current) drug therapy: Secondary | ICD-10-CM | POA: Insufficient documentation

## 2017-02-24 DIAGNOSIS — I2510 Atherosclerotic heart disease of native coronary artery without angina pectoris: Secondary | ICD-10-CM | POA: Insufficient documentation

## 2017-02-24 DIAGNOSIS — Z86718 Personal history of other venous thrombosis and embolism: Secondary | ICD-10-CM | POA: Diagnosis not present

## 2017-02-24 DIAGNOSIS — G4730 Sleep apnea, unspecified: Secondary | ICD-10-CM | POA: Diagnosis not present

## 2017-02-24 DIAGNOSIS — F329 Major depressive disorder, single episode, unspecified: Secondary | ICD-10-CM | POA: Diagnosis not present

## 2017-02-24 DIAGNOSIS — I10 Essential (primary) hypertension: Secondary | ICD-10-CM | POA: Diagnosis not present

## 2017-02-24 DIAGNOSIS — Z86010 Personal history of colonic polyps: Secondary | ICD-10-CM | POA: Diagnosis not present

## 2017-02-24 DIAGNOSIS — Z7982 Long term (current) use of aspirin: Secondary | ICD-10-CM | POA: Diagnosis not present

## 2017-02-24 HISTORY — DX: Decreased white blood cell count, unspecified: D72.819

## 2017-02-24 HISTORY — DX: Hyperglycemia, unspecified: R73.9

## 2017-02-24 HISTORY — PX: COLONOSCOPY WITH PROPOFOL: SHX5780

## 2017-02-24 HISTORY — DX: Cortical age-related cataract, unspecified eye: H25.019

## 2017-02-24 HISTORY — DX: Thrombocytopenia, unspecified: D69.6

## 2017-02-24 LAB — CBC WITH DIFFERENTIAL/PLATELET
BASOS ABS: 0 10*3/uL (ref 0–0.1)
Basophils Relative: 1 %
EOS ABS: 0.2 10*3/uL (ref 0–0.7)
Eosinophils Relative: 4 %
HEMATOCRIT: 45.8 % (ref 35.0–47.0)
Hemoglobin: 15.8 g/dL (ref 12.0–16.0)
Lymphocytes Relative: 32 %
Lymphs Abs: 1.9 10*3/uL (ref 1.0–3.6)
MCH: 31.8 pg (ref 26.0–34.0)
MCHC: 34.5 g/dL (ref 32.0–36.0)
MCV: 92.3 fL (ref 80.0–100.0)
Monocytes Absolute: 0.5 10*3/uL (ref 0.2–0.9)
Monocytes Relative: 9 %
NEUTROS ABS: 3.3 10*3/uL (ref 1.4–6.5)
NEUTROS PCT: 54 %
Platelets: 150 10*3/uL (ref 150–440)
RBC: 4.96 MIL/uL (ref 3.80–5.20)
RDW: 13.2 % (ref 11.5–14.5)
WBC: 6 10*3/uL (ref 3.6–11.0)

## 2017-02-24 LAB — PROTIME-INR
INR: 1.05
PROTHROMBIN TIME: 13.7 s (ref 11.4–15.2)

## 2017-02-24 SURGERY — COLONOSCOPY WITH PROPOFOL
Anesthesia: General

## 2017-02-24 MED ORDER — PROPOFOL 500 MG/50ML IV EMUL
INTRAVENOUS | Status: DC | PRN
  Administered 2017-02-24: 100 ug/kg/min via INTRAVENOUS

## 2017-02-24 MED ORDER — FENTANYL CITRATE (PF) 100 MCG/2ML IJ SOLN
INTRAMUSCULAR | Status: AC
  Filled 2017-02-24: qty 2

## 2017-02-24 MED ORDER — MIDAZOLAM HCL 2 MG/2ML IJ SOLN
INTRAMUSCULAR | Status: AC
  Filled 2017-02-24: qty 2

## 2017-02-24 MED ORDER — PROPOFOL 500 MG/50ML IV EMUL
INTRAVENOUS | Status: AC
  Filled 2017-02-24: qty 50

## 2017-02-24 MED ORDER — PHENYLEPHRINE HCL 10 MG/ML IJ SOLN
INTRAMUSCULAR | Status: DC | PRN
  Administered 2017-02-24: 50 ug via INTRAVENOUS

## 2017-02-24 MED ORDER — MIDAZOLAM HCL 2 MG/2ML IJ SOLN
INTRAMUSCULAR | Status: DC | PRN
  Administered 2017-02-24: 1 mg via INTRAVENOUS

## 2017-02-24 MED ORDER — SODIUM CHLORIDE 0.9 % IV SOLN
INTRAVENOUS | Status: DC
  Administered 2017-02-24: 09:00:00 via INTRAVENOUS

## 2017-02-24 MED ORDER — FENTANYL CITRATE (PF) 100 MCG/2ML IJ SOLN
INTRAMUSCULAR | Status: DC | PRN
  Administered 2017-02-24 (×2): 25 ug via INTRAVENOUS
  Administered 2017-02-24: 50 ug via INTRAVENOUS

## 2017-02-24 NOTE — Anesthesia Preprocedure Evaluation (Signed)
Anesthesia Evaluation  Patient identified by MRN, date of birth, ID band Patient awake    Reviewed: Allergy & Precautions, NPO status , Patient's Chart, lab work & pertinent test results, reviewed documented beta blocker date and time   Airway Mallampati: II  TM Distance: >3 FB     Dental  (+) Chipped   Pulmonary shortness of breath, sleep apnea , COPD, former smoker,           Cardiovascular hypertension, Pt. on medications + CAD       Neuro/Psych PSYCHIATRIC DISORDERS Depression    GI/Hepatic   Endo/Other    Renal/GU      Musculoskeletal  (+) Arthritis ,   Abdominal   Peds  Hematology   Anesthesia Other Findings   Reproductive/Obstetrics                             Anesthesia Physical Anesthesia Plan  ASA: III  Anesthesia Plan: General   Post-op Pain Management:    Induction: Intravenous  PONV Risk Score and Plan:   Airway Management Planned:   Additional Equipment:   Intra-op Plan:   Post-operative Plan:   Informed Consent: I have reviewed the patients History and Physical, chart, labs and discussed the procedure including the risks, benefits and alternatives for the proposed anesthesia with the patient or authorized representative who has indicated his/her understanding and acceptance.     Plan Discussed with: CRNA  Anesthesia Plan Comments:         Anesthesia Quick Evaluation

## 2017-02-24 NOTE — Anesthesia Post-op Follow-up Note (Cosign Needed)
Anesthesia QCDR form completed.        

## 2017-02-24 NOTE — Anesthesia Postprocedure Evaluation (Signed)
Anesthesia Post Note  Patient: Carol Riley  Procedure(s) Performed: Procedure(s) (LRB): COLONOSCOPY WITH PROPOFOL (N/A)  Patient location during evaluation: Endoscopy Anesthesia Type: General Level of consciousness: awake and alert Pain management: pain level controlled Vital Signs Assessment: post-procedure vital signs reviewed and stable Respiratory status: spontaneous breathing, nonlabored ventilation, respiratory function stable and patient connected to nasal cannula oxygen Cardiovascular status: blood pressure returned to baseline and stable Postop Assessment: no signs of nausea or vomiting Anesthetic complications: no     Last Vitals:  Vitals:   02/24/17 1210 02/24/17 1220  BP: (!) 103/56 117/61  Pulse: 74 71  Resp: 15 (!) 23  Temp:      Last Pain:  Vitals:   02/24/17 1146  TempSrc: Tympanic                 Dulcie Gammon S

## 2017-02-24 NOTE — Transfer of Care (Signed)
Immediate Anesthesia Transfer of Care Note  Patient: Carol Riley  Procedure(s) Performed: Procedure(s): COLONOSCOPY WITH PROPOFOL (N/A)  Patient Location: PACU  Anesthesia Type:General  Level of Consciousness: awake and sedated  Airway & Oxygen Therapy: Patient Spontanous Breathing and Patient connected to nasal cannula oxygen  Post-op Assessment: Report given to RN and Post -op Vital signs reviewed and stable  Post vital signs: Reviewed and stable  Last Vitals:  Vitals:   02/24/17 0827  BP: 137/65  Pulse: 71  Resp: (!) 22  Temp: 36.6 C    Last Pain:  Vitals:   02/24/17 0827  TempSrc: Tympanic         Complications: No apparent anesthesia complications

## 2017-02-24 NOTE — Op Note (Signed)
[facility] Patient Name: Copper Kirtley Procedure Date: 02/24/2017 10:01 AM MRN: [id number] Account #: [id number] Date of Birth: 23-May-1941 Admit Type: Outpatient Age: 76 Room: [facility] ENDO ROOM 1 Gender: Female Note Status: Finalized Procedure:            Colonoscopy Indications:          Clinically significant diarrhea of unexplained origin,                        Personal history of colonic polyps Providers:            Lollie Sails, MD Referring MD:         Ramonita Lab, MD (Referring MD) Medicines:            Monitored Anesthesia Care Complications:        No immediate complications. Procedure:            Pre-Anesthesia Assessment:                       - ASA Grade Assessment: III - A patient with severe                        systemic disease.                       After obtaining informed consent, the colonoscope was                        passed under direct vision. Throughout the procedure,                        the patient's blood pressure, pulse, and oxygen                        saturations were monitored continuously. The                        Colonoscope was introduced through the anus and                        advanced to the the cecum, identified by appendiceal                        orifice and ileocecal valve. The colonoscopy was                        unusually difficult due to restricted mobility of the                        colon and a tortuous colon. Successful completion of                        the procedure was aided by changing the patient to a                        supine position, changing the patient to a prone                        position and using manual pressure. The patient  tolerated the procedure well. The quality of the bowel                        preparation was good. Findings:      Multiple small and large-mouthed diverticula were found in the sigmoid       colon and  descending colon.      Biopsies for histology were taken with a cold forceps from the right       colon and left colon for evaluation of microscopic colitis.      The sigmoid colon, descending colon and transverse colon were       significantly redundant.      Non-bleeding internal hemorrhoids were found during anoscopy. The       hemorrhoids were small and Grade I (internal hemorrhoids that do not       prolapse). Impression:           - Diverticulosis in the sigmoid colon and in the                        descending colon.                       - Redundant colon.                       - Non-bleeding internal hemorrhoids.                       - Biopsies were taken with a cold forceps from the                        right colon and left colon for evaluation of                        microscopic colitis. Recommendation:       - Await pathology results. Procedure Code(s):    --- Professional ---                       [id number], Colonoscopy, flexible; with biopsy, single or                        multiple Diagnosis Code(s):    --- Professional ---                       K64.0, First degree hemorrhoids                       R19.7, Diarrhea, unspecified                       Z86.010, Personal history of colonic polyps                       K57.30, Diverticulosis of large intestine without                        perforation or abscess without bleeding                       Q43.8, Other specified congenital malformations of  intestine CPT copyright 2016 [employer]. All rights reserved. The codes documented in this report are preliminary and upon coder review may  be revised to meet current compliance requirements. Lollie Sails, MD 02/24/2017 11:43:08 AM This report has been signed electronically. Number of Addenda: 0 Note Initiated On: 02/24/2017 10:01 AM Scope Withdrawal Time: 0 hours 5 minutes 9 seconds  Total Procedure Duration: 0 hours 37  minutes 13 seconds       [facility]

## 2017-02-24 NOTE — Anesthesia Procedure Notes (Signed)
Performed by: COOK-MARTIN, Orey Moure Pre-anesthesia Checklist: Patient identified, Emergency Drugs available, Suction available, Patient being monitored and Timeout performed Patient Re-evaluated:Patient Re-evaluated prior to inductionOxygen Delivery Method: Nasal cannula Preoxygenation: Pre-oxygenation with 100% oxygen Intubation Type: IV induction Placement Confirmation: CO2 detector and positive ETCO2       

## 2017-02-24 NOTE — H&P (Signed)
Outpatient short stay form Pre-procedure 02/24/2017 5:25 PM Carol Sails MD  Primary Physician: Dr. Ramonita Lab  Reason for visit:  Colonoscopy  History of present illness:  Patient is a 76 year old female presenting today as above. She has had some issues with recent diarrhea although this has improved. There is also a personal history of adenomatous colon polyps. She does take Coumadin and her pro time was checked today. Also due to her history of thrombocytopenia and leukopenia a CBC with differential was also obtained and found to be adequate for procedure. She tolerated her prep well. She does take a daily aspirin in addition to the above this being 81 mg. Takes no other aspirin products or blood thinning agents.   No current facility-administered medications for this encounter.   Current Outpatient Prescriptions:  .  acetaminophen (TYLENOL) 325 MG tablet, Take 650 mg by mouth every 6 (six) hours as needed., Disp: , Rfl:  .  aspirin EC 81 MG tablet, Take 81 mg by mouth daily., Disp: , Rfl:  .  Cholecalciferol 1000 units tablet, Take 1,000 Units by mouth daily., Disp: , Rfl:  .  citalopram (CELEXA) 20 MG tablet, Take 20 mg by mouth daily., Disp: , Rfl:  .  clobetasol ointment (TEMOVATE) 9.37 %, Apply 1 application topically 2 (two) times daily., Disp: , Rfl:  .  gabapentin (NEURONTIN) 100 MG capsule, Take 100 mg by mouth 4 (four) times daily., Disp: , Rfl:  .  lisinopril (PRINIVIL,ZESTRIL) 40 MG tablet, Take 40 mg by mouth daily., Disp: , Rfl:  .  metoprolol succinate (TOPROL-XL) 25 MG 24 hr tablet, Take 12.5 mg by mouth daily., Disp: , Rfl:  .  Multiple Vitamin (MULTIVITAMIN) tablet, Take 1 tablet by mouth daily., Disp: , Rfl:  .  simvastatin (ZOCOR) 20 MG tablet, Take 20 mg by mouth daily., Disp: , Rfl:  .  triamcinolone cream (KENALOG) 0.5 %, Apply 1 application topically 2 (two) times daily., Disp: , Rfl:  .  gabapentin (NEURONTIN) 400 MG capsule, Take 400 mg by mouth 2 (two)  times daily., Disp: , Rfl:  .  warfarin (COUMADIN) 1 MG tablet, Take 1 mg by mouth daily., Disp: , Rfl:  .  warfarin (COUMADIN) 5 MG tablet, Take 5 mg by mouth daily., Disp: , Rfl:   No prescriptions prior to admission.     Allergies  Allergen Reactions  . Pneumovax [Pneumococcal Polysaccharide Vaccine]      Past Medical History:  Diagnosis Date  . Arthritis   . Cancer (HCC)    cervical cancer  . Cataract cortical, senile   . COPD (chronic obstructive pulmonary disease) ([place])   . Coronary artery disease   . Depression   . DVT (deep venous thrombosis) ([place])   . Edema    FEET/LEGS  . Hyperglycemia   . Hypertension   . Leukopenia   . Neuropathy   . Shortness of breath dyspnea   . Sleep apnea   . Thrombocytopenia ([place])     Review of systems:      Physical Exam    Heart and lungs: Regular rate and rhythm without rub or gallop, lungs are bilaterally clear.    HEENT: Normocephalic atraumatic eyes are anicteric    Other:     Pertinant exam for procedure: Soft nontender nondistended bowel sounds positive normoactive.    Planned proceedures: Colonoscopy and indicated procedures. I have discussed the risks benefits and complications of procedures to include not limited to bleeding, infection, perforation and the risk  of sedation and the patient wishes to proceed.    Carol Sails, MD Gastroenterology 02/24/2017  5:25 PM

## 2017-02-27 ENCOUNTER — Encounter: Payer: Self-pay | Admitting: Gastroenterology

## 2017-02-28 LAB — SURGICAL PATHOLOGY

## 2018-04-23 ENCOUNTER — Other Ambulatory Visit
Admission: RE | Admit: 2018-04-23 | Discharge: 2018-04-23 | Disposition: A | Discharge status: Home / Self Care | Payer: Medicare Other | Source: Ambulatory Visit | Attending: Internal Medicine | Admitting: Internal Medicine

## 2018-04-23 DIAGNOSIS — I10 Essential (primary) hypertension: Secondary | ICD-10-CM | POA: Diagnosis present

## 2018-04-23 DIAGNOSIS — R0609 Other forms of dyspnea: Secondary | ICD-10-CM | POA: Diagnosis present

## 2018-04-23 DIAGNOSIS — R600 Localized edema: Secondary | ICD-10-CM | POA: Diagnosis present

## 2018-04-23 LAB — BRAIN NATRIURETIC PEPTIDE: B NATRIURETIC PEPTIDE 5: 28 pg/mL (ref 0.0–100.0)

## 2018-04-24 ENCOUNTER — Other Ambulatory Visit: Payer: Self-pay | Admitting: Internal Medicine

## 2018-04-24 DIAGNOSIS — R600 Localized edema: Secondary | ICD-10-CM

## 2018-04-27 ENCOUNTER — Ambulatory Visit
Admission: RE | Admit: 2018-04-27 | Discharge: 2018-04-27 | Disposition: A | Discharge status: Home / Self Care | Payer: Medicare Other | Source: Ambulatory Visit | Attending: Internal Medicine | Admitting: Internal Medicine

## 2018-04-27 DIAGNOSIS — R600 Localized edema: Secondary | ICD-10-CM | POA: Insufficient documentation

## 2018-06-17 ENCOUNTER — Emergency Department: Payer: Medicare Other

## 2018-06-17 ENCOUNTER — Emergency Department
Admission: EM | Admit: 2018-06-17 | Discharge: 2018-06-17 | Disposition: A | Discharge status: Home / Self Care | Payer: Medicare Other | Attending: Emergency Medicine | Admitting: Emergency Medicine

## 2018-06-17 ENCOUNTER — Other Ambulatory Visit: Payer: Self-pay

## 2018-06-17 DIAGNOSIS — Z7901 Long term (current) use of anticoagulants: Secondary | ICD-10-CM | POA: Insufficient documentation

## 2018-06-17 DIAGNOSIS — S80812A Abrasion, left lower leg, initial encounter: Secondary | ICD-10-CM

## 2018-06-17 DIAGNOSIS — S0990XA Unspecified injury of head, initial encounter: Secondary | ICD-10-CM | POA: Diagnosis present

## 2018-06-17 DIAGNOSIS — Z79899 Other long term (current) drug therapy: Secondary | ICD-10-CM | POA: Insufficient documentation

## 2018-06-17 DIAGNOSIS — W19XXXA Unspecified fall, initial encounter: Secondary | ICD-10-CM

## 2018-06-17 DIAGNOSIS — Z8541 Personal history of malignant neoplasm of cervix uteri: Secondary | ICD-10-CM | POA: Insufficient documentation

## 2018-06-17 DIAGNOSIS — Z87891 Personal history of nicotine dependence: Secondary | ICD-10-CM | POA: Diagnosis not present

## 2018-06-17 DIAGNOSIS — Y999 Unspecified external cause status: Secondary | ICD-10-CM | POA: Insufficient documentation

## 2018-06-17 DIAGNOSIS — I10 Essential (primary) hypertension: Secondary | ICD-10-CM | POA: Diagnosis not present

## 2018-06-17 DIAGNOSIS — Z7982 Long term (current) use of aspirin: Secondary | ICD-10-CM | POA: Diagnosis not present

## 2018-06-17 DIAGNOSIS — J449 Chronic obstructive pulmonary disease, unspecified: Secondary | ICD-10-CM | POA: Insufficient documentation

## 2018-06-17 DIAGNOSIS — W01198A Fall on same level from slipping, tripping and stumbling with subsequent striking against other object, initial encounter: Secondary | ICD-10-CM | POA: Insufficient documentation

## 2018-06-17 DIAGNOSIS — S8012XA Contusion of left lower leg, initial encounter: Secondary | ICD-10-CM

## 2018-06-17 DIAGNOSIS — I2510 Atherosclerotic heart disease of native coronary artery without angina pectoris: Secondary | ICD-10-CM | POA: Insufficient documentation

## 2018-06-17 DIAGNOSIS — Y9389 Activity, other specified: Secondary | ICD-10-CM | POA: Insufficient documentation

## 2018-06-17 DIAGNOSIS — Y929 Unspecified place or not applicable: Secondary | ICD-10-CM | POA: Insufficient documentation

## 2018-06-17 LAB — PROTIME-INR
INR: 3.67
Prothrombin Time: 35.9 seconds — ABNORMAL HIGH (ref 11.4–15.2)

## 2018-06-17 NOTE — ED Triage Notes (Signed)
[place]; ambulatory on scene. A&O x4. Denies loc or neck pain. Hematoma to back of head.

## 2018-06-17 NOTE — Discharge Instructions (Addendum)
Return to the emergency department immediately for any worsening condition including confusion altered mental status, weakness, numbness, or any other symptoms concerning to you.

## 2018-06-17 NOTE — ED Provider Notes (Addendum)
[facility] Emergency Department Provider Note ____________________________________________   I have reviewed the triage vital signs and the triage nursing note.  HISTORY  Chief Complaint Fall   Historian Patient  HPI Carol Riley is a 77 y.o. female who takes warfarin, had a mechanical fall just prior to arrival, striking head on concrete.  No area of bleeding.  No headache.  Reports got tripped up on the chair leg gettting up from the table.  She is been having some trouble with her right leg, pain which is being followed by her primary care physician felt to be sciatica.  She is currently on muscle relaxer, anti-inflammatory and tramadol, and has had x-rays of her low back.  She is not reporting any new or worsening right lower extremity weakness, numbness, pain, or back pain.  Scraped the side of her left calf minimally.  Denies neck pain.  Denies upper extremity injuries.  Denies chest pain or trouble breathing.  Denies abdominal pain.     Past Medical History:  Diagnosis Date  . Arthritis   . Cancer (HCC)    cervical cancer  . Cataract cortical, senile   . COPD (chronic obstructive pulmonary disease) ([place])   . Coronary artery disease   . Depression   . DVT (deep venous thrombosis) ([place])   . Edema    FEET/LEGS  . Hyperglycemia   . Hypertension   . Leukopenia   . Neuropathy   . Shortness of breath dyspnea   . Sleep apnea   . Thrombocytopenia ([place])     There are no active problems to display for this patient.   Past Surgical History:  Procedure Laterality Date  . BACK SURGERY    . BREAST BIOPSY Left    benign needle  . COLONOSCOPY    . COLONOSCOPY WITH PROPOFOL N/A 02/24/2017   Procedure: COLONOSCOPY WITH PROPOFOL;  Surgeon: Lollie Sails, MD;  Location: [facility] ENDOSCOPY;  Service: Endoscopy;  Laterality: N/A;  . CORONARY ANGIOPLASTY     STENT  . EYE SURGERY    . GAS INSERTION Right 05/06/2015   Procedure: INSERTION OF GAS;   Surgeon: Milus Height, MD;  Location: [facility] ORS;  Service: Ophthalmology;  Laterality: Right;  . MEMBRANE PEEL Right 05/06/2015   Procedure: MEMBRANE PEEL;  Surgeon: Milus Height, MD;  Location: [facility] ORS;  Service: Ophthalmology;  Laterality: Right;  . PARS PLANA VITRECTOMY Right 05/06/2015   Procedure: PARS PLANA VITRECTOMY WITH 23 GAUGE;  Surgeon: Milus Height, MD;  Location: [facility] ORS;  Service: Ophthalmology;  Laterality: Right;    Prior to Admission medications   Medication Sig Start Date End Date Taking? Authorizing Provider  acetaminophen (TYLENOL) 325 MG tablet Take 650 mg by mouth every 6 (six) hours as needed.    [provider]  aspirin EC 81 MG tablet Take 81 mg by mouth daily.    [provider]  Cholecalciferol 1000 units tablet Take 1,000 Units by mouth daily.    [provider]  citalopram (CELEXA) 20 MG tablet Take 20 mg by mouth daily.    [provider]  clobetasol ointment (TEMOVATE) 2.77 % Apply 1 application topically 2 (two) times daily.    [provider]  gabapentin (NEURONTIN) 100 MG capsule Take 100 mg by mouth 4 (four) times daily.    [provider]  gabapentin (NEURONTIN) 400 MG capsule Take 400 mg by mouth 2 (two) times daily.    [provider]  lisinopril (PRINIVIL,ZESTRIL) 40 MG tablet  Take 40 mg by mouth daily.    [provider]  metoprolol succinate (TOPROL-XL) 25 MG 24 hr tablet Take 12.5 mg by mouth daily.    [provider]  Multiple Vitamin (MULTIVITAMIN) tablet Take 1 tablet by mouth daily.    [provider]  simvastatin (ZOCOR) 20 MG tablet Take 20 mg by mouth daily.    [provider]  triamcinolone cream (KENALOG) 0.5 % Apply 1 application topically 2 (two) times daily.    [provider]  warfarin (COUMADIN) 1 MG tablet Take 1 mg by mouth daily.    [provider]  warfarin (COUMADIN) 5 MG tablet Take 5 mg by mouth daily.     [provider]    Allergies  Allergen Reactions  . Pneumovax [Pneumococcal Polysaccharide Vaccine]     Family History  Problem Relation Age of Onset  . Breast cancer Sister 34  . Cancer Mother     Social History Social History   Tobacco Use  . Smoking status: Former Research scientist (life sciences)  . Smokeless tobacco: Never Used  Substance Use Topics  . Alcohol use: No  . Drug use: No    Review of Systems  Constitutional: Negative for fever. Eyes: Negative for visual changes. ENT: Negative for sore throat. Cardiovascular: Negative for chest pain. Respiratory: Negative for shortness of breath. Gastrointestinal: Negative for abdominal pain, vomiting and diarrhea. Genitourinary: Negative for dysuria. Musculoskeletal: Negative for back pain. Skin: Negative for rash. Neurological: Negative for headache.  ____________________________________________   PHYSICAL EXAM:  VITAL SIGNS: ED Triage Vitals  Enc Vitals Group     BP 06/17/18 1319 (!) 146/57     Pulse Rate 06/17/18 1319 85     Resp 06/17/18 1319 18     Temp 06/17/18 1319 98.7 F (37.1 C)     Temp src --      SpO2 06/17/18 1319 94 %     Weight 06/17/18 1317 190 lb (86.2 kg)     Height 06/17/18 1317 5\' 4"  (1.626 m)     Head Circumference --      Peak Flow --      Pain Score 06/17/18 1317 3     Pain Loc --      Pain Edu? --      Excl. in [place]? --      Constitutional: Alert and oriented.  HEENT      Head: Normocephalic and atraumatic.      Eyes: Conjunctivae are normal. Pupils equal and round.       Ears:         Nose: No congestion/rhinnorhea.      Mouth/Throat: Mucous membranes are moist.      Neck: No stridor.  Nontender posterior C-spine with palpation and range of motion. Cardiovascular/Chest: Normal rate, regular rhythm.  No murmurs, rubs, or gallops. Respiratory: Normal respiratory effort without tachypnea nor retractions. Breath sounds are clear and equal bilaterally. No  wheezes/rales/rhonchi. Gastrointestinal: Soft. No distention, no guarding, no rebound. Nontender.   Genitourinary/rectal:Deferred Musculoskeletal: Nontender with normal range of motion in all extremities. No joint effusions.  No lower extremity tenderness.  No edema. Neurologic:  Normal speech and language. No gross or focal neurologic deficits are appreciated. Skin:  Skin is warm, dry.  Mild scrape left lateral calf. Psychiatric: Mood and affect are normal. Speech and behavior are normal. Patient exhibits appropriate insight and judgment.   ____________________________________________  LABS (pertinent positives/negatives) I, Lisa Roca, MD the attending physician have reviewed the labs noted below.  Labs Reviewed  PROTIME-INR - Abnormal; Notable for the following components:      Result Value   Prothrombin Time 35.9 (*)    All other components within normal limits    ____________________________________________    EKG I, Lisa Roca, MD, the attending physician have personally viewed and interpreted all ECGs.  None ____________________________________________  RADIOLOGY   CT without contrast:  IMPRESSION: Posterior scalp hematoma without underlying fracture or acute intracranial abnormality.  Mild atrophy and chronic microvascular ischemic change. __________________________________________  PROCEDURES  Procedure(s) performed: None  Procedures  Critical Care performed: None   ____________________________________________  ED COURSE / ASSESSMENT AND PLAN  Pertinent labs & imaging results that were available during my care of the patient were reviewed by me and considered in my medical decision making (see chart for details).     Stable vital signs.  She struck her head and has some tenderness to the back of her scalp.  CT had obtained given she is on warfarin, and thankfully there is no evidence for intracranial traumatic injury.  INR 3.67, discussed  holding one dose.  We discussed return precautions.  To date on tetanus.  During the time she is been here in the ER at the small scratch at the left calf is hemostatic but does have a sizable hematoma there.  Ace wrap was applied.    CONSULTATIONS:   None  Patient / Family / Caregiver informed of clinical course, medical decision-making process, and agree with plan.   I discussed return precautions, follow-up instructions, and discharge instructions with patient and/or family.  Discharge Instructions : Return to the emergency department immediately for any worsening condition including confusion altered mental status, weakness, numbness, or any other symptoms concerning to you.    ___________________________________________   FINAL CLINICAL IMPRESSION(S) / ED DIAGNOSES   Final diagnoses:  Fall, initial encounter  Injury of head, initial encounter  Contusion of left lower extremity, initial encounter  Abrasion of left leg, initial encounter      ___________________________________________         Note: This dictation was prepared with Dragon dictation. Any transcriptional errors that result from this process are unintentional    Lisa Roca, MD 06/17/18 1438    Lisa Roca, MD 06/17/18 1438

## 2018-06-18 ENCOUNTER — Emergency Department: Payer: Medicare Other

## 2018-06-18 ENCOUNTER — Emergency Department
Admission: EM | Admit: 2018-06-18 | Discharge: 2018-06-18 | Disposition: A | Discharge status: Home / Self Care | Payer: Medicare Other | Attending: Emergency Medicine | Admitting: Emergency Medicine

## 2018-06-18 ENCOUNTER — Encounter: Payer: Self-pay | Admitting: Emergency Medicine

## 2018-06-18 DIAGNOSIS — S8012XA Contusion of left lower leg, initial encounter: Secondary | ICD-10-CM | POA: Insufficient documentation

## 2018-06-18 DIAGNOSIS — I2510 Atherosclerotic heart disease of native coronary artery without angina pectoris: Secondary | ICD-10-CM | POA: Diagnosis not present

## 2018-06-18 DIAGNOSIS — Y9222 Religious institution as the place of occurrence of the external cause: Secondary | ICD-10-CM | POA: Insufficient documentation

## 2018-06-18 DIAGNOSIS — T148XXA Other injury of unspecified body region, initial encounter: Secondary | ICD-10-CM

## 2018-06-18 DIAGNOSIS — Y939 Activity, unspecified: Secondary | ICD-10-CM | POA: Diagnosis not present

## 2018-06-18 DIAGNOSIS — J449 Chronic obstructive pulmonary disease, unspecified: Secondary | ICD-10-CM | POA: Insufficient documentation

## 2018-06-18 DIAGNOSIS — Z7982 Long term (current) use of aspirin: Secondary | ICD-10-CM | POA: Diagnosis not present

## 2018-06-18 DIAGNOSIS — Z79899 Other long term (current) drug therapy: Secondary | ICD-10-CM | POA: Diagnosis not present

## 2018-06-18 DIAGNOSIS — S40011A Contusion of right shoulder, initial encounter: Secondary | ICD-10-CM | POA: Diagnosis not present

## 2018-06-18 DIAGNOSIS — Y999 Unspecified external cause status: Secondary | ICD-10-CM | POA: Diagnosis not present

## 2018-06-18 DIAGNOSIS — W010XXA Fall on same level from slipping, tripping and stumbling without subsequent striking against object, initial encounter: Secondary | ICD-10-CM | POA: Insufficient documentation

## 2018-06-18 DIAGNOSIS — I10 Essential (primary) hypertension: Secondary | ICD-10-CM | POA: Diagnosis not present

## 2018-06-18 DIAGNOSIS — Z7901 Long term (current) use of anticoagulants: Secondary | ICD-10-CM | POA: Diagnosis not present

## 2018-06-18 DIAGNOSIS — Z87891 Personal history of nicotine dependence: Secondary | ICD-10-CM | POA: Diagnosis not present

## 2018-06-18 DIAGNOSIS — S4991XA Unspecified injury of right shoulder and upper arm, initial encounter: Secondary | ICD-10-CM | POA: Diagnosis present

## 2018-06-18 MED ORDER — DIAZEPAM 2 MG PO TABS: 2.0000 mg | ORAL_TABLET | Freq: Every evening | ORAL | 0 refills | Status: AC | PRN

## 2018-06-18 NOTE — ED Notes (Signed)
Pt returned from xray

## 2018-06-18 NOTE — ED Provider Notes (Signed)
[facility] Emergency Department Provider Note  ____________________________________________  Time seen: Approximately 11:28 AM  I have reviewed the triage vital signs and the nursing notes.   HISTORY  Chief Complaint Fall; Leg Pain; and Shoulder Injury    HPI Carol Riley is a 77 y.o. female with a history of COPD, CAD, DVT, chronic Coumadin therapy for approximately 10 years who had a fall yesterday at church.  She was seen in the ED, work-up was reassuring, was discharged home.  She held her Coumadin last night.  However, today she has persistent right shoulder pain and left leg pain.  Denies any other new symptoms.  No coldness weakness or paresthesia of the left foot.  No right arm motor weakness or paresthesia.  Pains are constant, worse with movement, no alleviating factors.  No laceration or bleeding.  Last tetanus was within the last 5 years.      Past Medical History:  Diagnosis Date  . Arthritis   . Cancer (HCC)    cervical cancer  . Cataract cortical, senile   . COPD (chronic obstructive pulmonary disease) ([place])   . Coronary artery disease   . Depression   . DVT (deep venous thrombosis) ([place])   . Edema    FEET/LEGS  . Hyperglycemia   . Hypertension   . Leukopenia   . Neuropathy   . Shortness of breath dyspnea   . Sleep apnea   . Thrombocytopenia ([place])      There are no active problems to display for this patient.    Past Surgical History:  Procedure Laterality Date  . BACK SURGERY    . BREAST BIOPSY Left    benign needle  . COLONOSCOPY    . COLONOSCOPY WITH PROPOFOL N/A 02/24/2017   Procedure: COLONOSCOPY WITH PROPOFOL;  Surgeon: Lollie Sails, MD;  Location: [facility] ENDOSCOPY;  Service: Endoscopy;  Laterality: N/A;  . CORONARY ANGIOPLASTY     STENT  . EYE SURGERY    . GAS INSERTION Right 05/06/2015   Procedure: INSERTION OF GAS;  Surgeon: Milus Height, MD;  Location: [facility] ORS;  Service: Ophthalmology;  Laterality:  Right;  . MEMBRANE PEEL Right 05/06/2015   Procedure: MEMBRANE PEEL;  Surgeon: Milus Height, MD;  Location: [facility] ORS;  Service: Ophthalmology;  Laterality: Right;  . PARS PLANA VITRECTOMY Right 05/06/2015   Procedure: PARS PLANA VITRECTOMY WITH 23 GAUGE;  Surgeon: Milus Height, MD;  Location: [facility] ORS;  Service: Ophthalmology;  Laterality: Right;     Prior to Admission medications   Medication Sig Start Date End Date Taking? Authorizing Provider  acetaminophen (TYLENOL) 325 MG tablet Take 650 mg by mouth every 6 (six) hours as needed.    [provider]  aspirin EC 81 MG tablet Take 81 mg by mouth daily.    [provider]  Cholecalciferol 1000 units tablet Take 1,000 Units by mouth daily.    [provider]  citalopram (CELEXA) 20 MG tablet Take 20 mg by mouth daily.    [provider]  clobetasol ointment (TEMOVATE) 2.22 % Apply 1 application topically 2 (two) times daily.    [provider]  diazepam (VALIUM) 2 MG tablet Take 1 tablet (2 mg total) by mouth at bedtime as needed for up to 4 days (muscle spasm). 06/18/18 06/22/18  Carrie Mew, MD  gabapentin (NEURONTIN) 100 MG capsule Take 100 mg by mouth 4 (four) times daily.    [provider]  gabapentin (NEURONTIN) 400 MG capsule Take 400  mg by mouth 2 (two) times daily.    [provider]  lisinopril (PRINIVIL,ZESTRIL) 40 MG tablet Take 40 mg by mouth daily.    [provider]  metoprolol succinate (TOPROL-XL) 25 MG 24 hr tablet Take 12.5 mg by mouth daily.    [provider]  Multiple Vitamin (MULTIVITAMIN) tablet Take 1 tablet by mouth daily.    [provider]  simvastatin (ZOCOR) 20 MG tablet Take 20 mg by mouth daily.    [provider]  triamcinolone cream (KENALOG) 0.5 % Apply 1 application topically 2 (two) times daily.    [provider]  warfarin (COUMADIN) 1 MG tablet Take 1 mg by mouth daily.    [provider]  warfarin (COUMADIN) 5 MG tablet Take 5 mg by mouth daily.    [provider]     Allergies Pneumovax [pneumococcal polysaccharide vaccine]   Family History  Problem Relation Age of Onset  . Breast cancer Sister 63  . Cancer Mother     Social History Social History   Tobacco Use  . Smoking status: Former Research scientist (life sciences)  . Smokeless tobacco: Never Used  Substance Use Topics  . Alcohol use: No  . Drug use: No    Review of Systems  Constitutional:   No fever or chills.   Cardiovascular:   No chest pain or syncope. Respiratory:   No dyspnea or cough. Gastrointestinal:   Negative for abdominal pain, vomiting and diarrhea.  Musculoskeletal:   Right shoulder and left leg pain as above. All other systems reviewed and are negative except as documented above in ROS and HPI.  ____________________________________________   PHYSICAL EXAM:  VITAL SIGNS: ED Triage Vitals  Enc Vitals Group     BP 06/18/18 0947 131/67     Pulse Rate 06/18/18 0947 90     Resp 06/18/18 0947 20     Temp 06/18/18 0951 97.6 F (36.4 C)     Temp Source 06/18/18 0951 Oral     SpO2 06/18/18 0947 95 %     Weight 06/18/18 0948 190 lb (86.2 kg)     Height 06/18/18 0948 5\' 4"  (1.626 m)     Head Circumference --      Peak Flow --      Pain Score 06/18/18 0948 8     Pain Loc --      Pain Edu? --      Excl. in [place]? --     Vital signs reviewed, nursing assessments reviewed.   Constitutional:   Alert and oriented. Non-toxic appearance. Eyes:   Conjunctivae are normal. EOMI. PERRL. ENT      Head:   Normocephalic and atraumatic.  No raccoon eyes or battle sign      Nose:   No congestion/rhinnorhea.  No epistaxis      Mouth/Throat:   MMM, no pharyngeal erythema. No peritonsillar mass.       Neck:   No meningismus. Full ROM. Hematological/Lymphatic/Immunilogical:   No cervical lymphadenopathy. Cardiovascular:   RRR. Symmetric bilateral radial and DP pulses.  No murmurs. Cap refill  less than 2 seconds. Respiratory:   Normal respiratory effort without tachypnea/retractions. Breath sounds are clear and equal bilaterally. No wheezes/rales/rhonchi. Gastrointestinal:   Soft and nontender. Non distended. There is no CVA tenderness.  No rebound, rigidity, or guarding. Musculoskeletal:   Normal range of motion in all extremities. No joint effusions.  Right shoulder with some muscular tenderness posteriorly that reproduces her pain.  Range of motion is  intact.  Left proximal lateral lower leg, area proximal fibula, is swollen and ecchymotic.  Palpable subcutaneous hematoma without laceration or bleeding.  No inflammatory changes to suggest infection.  Compartments are soft. Neurologic:   Normal speech and language.  Motor grossly intact. EHL normal in left foot No acute focal neurologic deficits are appreciated.  Skin:    Skin is warm, dry and intact. No rash noted.  No petechiae, purpura, or bullae.  ____________________________________________    LABS (pertinent positives/negatives) (all labs ordered are listed, but only abnormal results are displayed) Labs Reviewed - No data to display ____________________________________________   EKG    ____________________________________________    RADIOLOGY  Dg Chest 2 View  Result Date: 06/18/2018 CLINICAL DATA:  Pt states she tripped and fell yesterday while at church and was seen here, but pt states she continues to have right shoulder pain and pain to the left lower lateral leg with noted swelling. H/o cervical cancer, COPD, CAD, HTN, former smoker EXAM: CHEST - 2 VIEW COMPARISON:  05/21/2009 FINDINGS: Coarse bibasilar and perihilar bronchovascular markings as before. No confluent airspace disease or overt edema. Heart size upper limits normal. Aortic Atherosclerosis (ICD10-170.0). No pneumothorax.  No effusion. Vertebral endplate spurring at multiple levels in the mid and lower thoracic spine. IMPRESSION: 1. Stable chronic  interstitial changes.  No acute disease. Electronically Signed   By: Lucrezia Europe M.D.   On: 06/18/2018 10:52   Dg Shoulder Right  Result Date: 06/18/2018 CLINICAL DATA:  77 year old female post fall. Shoulder pain. Initial encounter. EXAM: RIGHT SHOULDER - 2+ VIEW COMPARISON:  06/18/2018 chest x-ray. FINDINGS: Moderate acromioclavicular joint degenerative changes. Soft tissue calcifications along the distal aspect of the rotator cuff. No fracture or dislocation. Visualized lungs clear. IMPRESSION: 1. No fracture or dislocation. 2. Moderate acromioclavicular joint degenerative changes. 3. Soft tissue calcifications along the distal aspect of the rotator cuff. Electronically Signed   By: Genia Del M.D.   On: 06/18/2018 10:58   Dg Tibia/fibula Left  Result Date: 06/18/2018 CLINICAL DATA:  77 year old female post fall. Leg pain. Initial encounter. EXAM: LEFT TIBIA AND FIBULA - 2 VIEW COMPARISON:  04/21/2014 CT. FINDINGS: No fracture or dislocation. Prominent soft tissue may be normal for this patient although soft tissue injury not excluded. IMPRESSION: 1. No fracture or dislocation. 2. Prominent soft tissue may be normal for this patient although soft tissue injury not excluded. Electronically Signed   By: Genia Del M.D.   On: 06/18/2018 10:57   Ct Head Wo Contrast  Result Date: 06/17/2018 CLINICAL DATA:  Status post fall today with a blow to the back of the head. Initial encounter. EXAM: CT HEAD WITHOUT CONTRAST TECHNIQUE: Contiguous axial images were obtained from the base of the skull through the vertex without intravenous contrast. COMPARISON:  None. FINDINGS: Brain: No evidence of acute infarction, hemorrhage, hydrocephalus, extra-axial collection or mass lesion/mass effect. Mild atrophy and chronic microvascular ischemic change noted. Vascular: No hyperdense vessel or unexpected calcification. Skull: Intact.  No focal lesion. Sinuses/Orbits: Negative. Other: Small appearing scalp hematoma  posteriorly noted. IMPRESSION: Posterior scalp hematoma without underlying fracture or acute intracranial abnormality. Mild atrophy and chronic microvascular ischemic change. Electronically Signed   By: Inge Rise M.D.   On: 06/17/2018 14:05    ____________________________________________   PROCEDURES Procedures  ____________________________________________    CLINICAL IMPRESSION / ASSESSMENT AND PLAN / ED COURSE  Pertinent labs & imaging results that were available during my care of the patient were reviewed by me and considered  in my medical decision making (see chart for details).    Patient nontoxic, and vital signs normal, presents for repeat evaluation after fall yesterday and some blunt trauma.  Due to her Coumadin she is developed a subcutaneous hematoma of the left lower leg.  No evidence of compartment syndrome.  We will have the patient hold her Coumadin again today to allow for coagulation of her soft tissue injury.  After a few days suggested that she use a heating pad to help degrade the clot and speed her symptom resolution.  Recommended follow-up with her doctor this week for continued monitoring, return precautions for signs of compartment syndrome or infection.      ____________________________________________   FINAL CLINICAL IMPRESSION(S) / ED DIAGNOSES    Final diagnoses:  Subcutaneous hematoma  Contusion of right shoulder, initial encounter     ED Discharge Orders         Ordered    diazepam (VALIUM) 2 MG tablet  At bedtime PRN     06/18/18 1128          Portions of this note were generated with dragon dictation software. Dictation errors may occur despite best attempts at proofreading.    Carrie Mew, MD 06/18/18 [phone]

## 2018-06-18 NOTE — Discharge Instructions (Signed)
Your INR was 3.6 yesterday.  Hold your warfarin tonight as well, resume tomorrow.  Follow-up with your doctor this week.  If you develop pain coldness tingling or numbness in the left foot, or worsening severe pain of the left leg, return to the emergency room for reassessment.

## 2018-06-18 NOTE — ED Notes (Signed)
ED Provider at bedside. 

## 2018-06-18 NOTE — ED Notes (Signed)
Pt states she tripped and fell yesterday while at church and was seen here, but pt states she continues to have right shoulder pain and pain to the left lower lateral leg with noted swelling. Pt is on coumadin currently. Pt is a/ox4 on arrival. States her son is here with her today.

## 2018-06-18 NOTE — ED Triage Notes (Addendum)
Pt reports fell at [facility] yesterday and hurt her head and left leg. Pt reports she had a MRI of her head but did not really check her right shoulder or left leg.

## 2018-07-13 ENCOUNTER — Ambulatory Visit (INDEPENDENT_AMBULATORY_CARE_PROVIDER_SITE_OTHER): Payer: Medicare Other | Admitting: Vascular Surgery

## 2018-07-13 ENCOUNTER — Encounter (INDEPENDENT_AMBULATORY_CARE_PROVIDER_SITE_OTHER): Payer: Self-pay | Admitting: Vascular Surgery

## 2018-07-13 DIAGNOSIS — L97222 Non-pressure chronic ulcer of left calf with fat layer exposed: Secondary | ICD-10-CM | POA: Diagnosis not present

## 2018-07-13 DIAGNOSIS — R609 Edema, unspecified: Secondary | ICD-10-CM | POA: Diagnosis not present

## 2018-07-13 DIAGNOSIS — I2510 Atherosclerotic heart disease of native coronary artery without angina pectoris: Secondary | ICD-10-CM

## 2018-07-13 DIAGNOSIS — I10 Essential (primary) hypertension: Secondary | ICD-10-CM | POA: Diagnosis not present

## 2018-07-13 NOTE — Patient Instructions (Signed)
Edema Edema is an abnormal buildup of fluids in your bodytissues. Edema is somewhatdependent on gravity to pull the fluid to the lowest place in your body. That makes the condition more common in the legs and thighs (lower extremities). Painless swelling of the feet and ankles is common and becomes more likely as you get older. It is also common in looser tissues, like around your eyes. When the affected area is squeezed, the fluid may move out of that spot and leave a dent for a few moments. This dent is called pitting. What are the causes? There are many possible causes of edema. Eating too much salt and being on your feet or sitting for a long time can cause edema in your legs and ankles. Hot weather may make edema worse. Common medical causes of edema include:  Heart failure.  Liver disease.  Kidney disease.  Weak blood vessels in your legs.  Cancer.  An injury.  Pregnancy.  Some medications.  Obesity.  What are the signs or symptoms? Edema is usually painless.Your skin may look swollen or shiny. How is this diagnosed? Your health care provider may be able to diagnose edema by asking about your medical history and doing a physical exam. You may need to have tests such as X-rays, an electrocardiogram, or blood tests to check for medical conditions that may cause edema. How is this treated? Edema treatment depends on the cause. If you have heart, liver, or kidney disease, you need the treatment appropriate for these conditions. General treatment may include:  Elevation of the affected body part above the level of your heart.  Compression of the affected body part. Pressure from elastic bandages or support stockings squeezes the tissues and forces fluid back into the blood vessels. This keeps fluid from entering the tissues.  Restriction of fluid and salt intake.  Use of a water pill (diuretic). These medications are appropriate only for some types of edema. They pull fluid  out of your body and make you urinate more often. This gets rid of fluid and reduces swelling, but diuretics can have side effects. Only use diuretics as directed by your health care provider.  Follow these instructions at home:  Keep the affected body part above the level of your heart when you are lying down.  Do not sit still or stand for prolonged periods.  Do not put anything directly under your knees when lying down.  Do not wear constricting clothing or garters on your upper legs.  Exercise your legs to work the fluid back into your blood vessels. This may help the swelling go down.  Wear elastic bandages or support stockings to reduce ankle swelling as directed by your health care provider.  Eat a low-salt diet to reduce fluid if your health care provider recommends it.  Only take medicines as directed by your health care provider. Contact a health care provider if:  Your edema is not responding to treatment.  You have heart, liver, or kidney disease and notice symptoms of edema.  You have edema in your legs that does not improve after elevating them.  You have sudden and unexplained weight gain. Get help right away if:  You develop shortness of breath or chest pain.  You cannot breathe when you lie down.  You develop pain, redness, or warmth in the swollen areas.  You have heart, liver, or kidney disease and suddenly get edema.  You have a fever and your symptoms suddenly get worse. This information is   not intended to replace advice given to you by your health care provider. Make sure you discuss any questions you have with your health care provider. Document Released: 08/15/2005 Document Revised: 01/21/2016 Document Reviewed: 06/07/2013 Elsevier Interactive Patient Education  2017 [employer]  

## 2018-07-13 NOTE — Assessment & Plan Note (Signed)
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  A 3 layer Unna boot was placed today and will be changed weekly.  I suspect this will be necessary for 1 to 2 months and she may ultimately require skin graft depending on how large the defect is when all is said and done.  I have discussed the importance of leg elevation in controlling of the swelling.  The patient will return to see me in about a month and will get weekly Unna boots up until that time.

## 2018-07-13 NOTE — Assessment & Plan Note (Signed)
blood pressure control important in reducing the progression of atherosclerotic disease. On appropriate oral medications.  

## 2018-07-13 NOTE — Assessment & Plan Note (Signed)
The patient has had issues with chronic edema, but her left leg is much more swollen secondary to the hematoma and the resultant inflammation.  The Unna boot will help control the swelling as well as heal the wound.  We will reassess this in several weeks after weekly Unna boot placements.

## 2018-07-13 NOTE — Progress Notes (Signed)
Patient ID: Carol Riley, female   DOB: 03/04/1941, 77 y.o.   MRN: [id number]  Chief Complaint  Patient presents with  . New Patient (Initial Visit)    ref [name] for left leg hematoma and edema    HPI Carol Riley is a 77 y.o. female.  I am asked to see the patient by Dr. [name] for evaluation of a large hematoma of her left leg with resultant marked swelling and skin breakdown.  The patient reports having a fall about 4 weeks ago.  She was originally seen in the hospital and evaluated for a head injury as she is on Coumadin.  Although her head work-up was negative, her real pain and swelling was in her left leg.  She really presented to the emergency room the next day and had a large draining hematoma on the left leg.  That was 4 weeks ago.  She still has marked swelling and bruising on that leg.  She now has an open ulceration that is larger than a silver dollar on the lateral aspect of the left calf.  There is some serosanguineous material that is weeping from that.  The bruising still tracks down her leg and the swelling is still quite prominent.  Pain is also still present although not nearly as severe as it was.  She has been trying to elevate her legs.  No right leg symptoms.  No fever or chills.   Past Medical History:  Diagnosis Date  . Arthritis   . Cancer (HCC)    cervical cancer  . Cataract cortical, senile   . COPD (chronic obstructive pulmonary disease) ([place])   . Coronary artery disease   . Depression   . DVT (deep venous thrombosis) ([place])   . Edema    FEET/LEGS  . Hyperglycemia   . Hypertension   . Leukopenia   . Neuropathy   . Shortness of breath dyspnea   . Sleep apnea   . Thrombocytopenia ([place])     Past Surgical History:  Procedure Laterality Date  . BACK SURGERY    . BREAST BIOPSY Left    benign needle  . COLONOSCOPY    . COLONOSCOPY WITH PROPOFOL N/A 02/24/2017   Procedure: COLONOSCOPY WITH PROPOFOL;  Surgeon: Lollie Sails, MD;  Location: [facility]  ENDOSCOPY;  Service: Endoscopy;  Laterality: N/A;  . CORONARY ANGIOPLASTY     STENT  . EYE SURGERY    . GAS INSERTION Right 05/06/2015   Procedure: INSERTION OF GAS;  Surgeon: Milus Height, MD;  Location: [facility] ORS;  Service: Ophthalmology;  Laterality: Right;  . MEMBRANE PEEL Right 05/06/2015   Procedure: MEMBRANE PEEL;  Surgeon: Milus Height, MD;  Location: [facility] ORS;  Service: Ophthalmology;  Laterality: Right;  . PARS PLANA VITRECTOMY Right 05/06/2015   Procedure: PARS PLANA VITRECTOMY WITH 23 GAUGE;  Surgeon: Milus Height, MD;  Location: [facility] ORS;  Service: Ophthalmology;  Laterality: Right;    Family History  Problem Relation Age of Onset  . Breast cancer Sister 46  . Cancer Mother   No bleeding disorders, clotting disorders, or aneurysms  Social History Social History   Tobacco Use  . Smoking status: Former Research scientist (life sciences)  . Smokeless tobacco: Never Used  Substance Use Topics  . Alcohol use: No  . Drug use: No    Allergies  Allergen Reactions  . Pneumovax [Pneumococcal Polysaccharide Vaccine]     Current Outpatient Medications  Medication Sig Dispense Refill  . acetaminophen (TYLENOL) 325 MG tablet Take  650 mg by mouth every 6 (six) hours as needed.    Marland Kitchen aspirin EC 81 MG tablet Take 81 mg by mouth daily.    . Cholecalciferol 1000 units tablet Take 1,000 Units by mouth daily.    . citalopram (CELEXA) 20 MG tablet Take 20 mg by mouth daily.    Marland Kitchen gabapentin (NEURONTIN) 400 MG capsule Take 400 mg by mouth 2 (two) times daily.    Marland Kitchen lisinopril (PRINIVIL,ZESTRIL) 40 MG tablet Take 40 mg by mouth daily.    . metoprolol succinate (TOPROL-XL) 25 MG 24 hr tablet Take 12.5 mg by mouth daily.    . Multiple Vitamin (MULTIVITAMIN) tablet Take 1 tablet by mouth daily.    . simvastatin (ZOCOR) 20 MG tablet Take 20 mg by mouth daily.    Marland Kitchen warfarin (COUMADIN) 1 MG tablet Take 1 mg by mouth daily.     Marland Kitchen warfarin (COUMADIN) 5 MG tablet Take 5 mg by mouth daily.     . clobetasol ointment  (TEMOVATE) 1.61 % Apply 1 application topically 2 (two) times daily.    Marland Kitchen gabapentin (NEURONTIN) 100 MG capsule Take 100 mg by mouth 4 (four) times daily.    Marland Kitchen triamcinolone cream (KENALOG) 0.5 % Apply 1 application topically 2 (two) times daily.     No current facility-administered medications for this visit.       REVIEW OF SYSTEMS (Negative unless checked)  Constitutional: [] Weight loss  [] Fever  [] Chills Cardiac: [] Chest pain   [] Chest pressure   [x] Palpitations   [] Shortness of breath when laying flat   [] Shortness of breath at rest   [] Shortness of breath with exertion. Vascular:  [] Pain in legs with walking   [] Pain in legs at rest   [] Pain in legs when laying flat   [] Claudication   [] Pain in feet when walking  [] Pain in feet at rest  [] Pain in feet when laying flat   [x] History of DVT   [] Phlebitis   [x] Swelling in legs   [] Varicose veins   [x] Non-healing ulcers Pulmonary:   [] Uses home oxygen   [] Productive cough   [] Hemoptysis   [] Wheeze  [x] COPD   [] Asthma Neurologic:  [] Dizziness  [] Blackouts   [] Seizures   [] History of stroke   [] History of TIA  [] Aphasia   [] Temporary blindness   [] Dysphagia   [] Weakness or numbness in arms   [] Weakness or numbness in legs Musculoskeletal:  [x] Arthritis   [] Joint swelling   [] Joint pain   [] Low back pain Hematologic:  [] Easy bruising  [] Easy bleeding   [] Hypercoagulable state   [] Anemic  [] Hepatitis Gastrointestinal:  [] Blood in stool   [] Vomiting blood  [] Gastroesophageal reflux/heartburn   [] Abdominal pain Genitourinary:  [] Chronic kidney disease   [] Difficult urination  [] Frequent urination  [] Burning with urination   [] Hematuria Skin:  [] Rashes   [x] Ulcers   [x] Wounds Psychological:  [] History of anxiety   []  History of major depression.    Physical Exam BP 117/67 (BP Location: Right Arm)   Pulse 89   Resp 16   Ht 5\' 4"  (1.626 m)   Wt 185 lb 12.8 oz (84.3 kg)   BMI 31.89 kg/m  Gen:  WD/WN, NAD.  Appears younger than stated  age Head: [place]/AT, No temporalis wasting. Ear/Nose/Throat: Hearing grossly intact, nares w/o erythema or drainage, oropharynx w/o Erythema/Exudate Eyes: Conjunctiva clear, sclera non-icteric  Neck: trachea midline.   Pulmonary:  Good air movement, respirations not labored, no use of accessory muscles Cardiac: RRR, no JVD Vascular:  Vessel Right Left  Radial Palpable Palpable                          PT  1+ palpable  trace palpable  DP Palpable  1+ palpable   Gastrointestinal: soft, non-tender/non-distended.  Musculoskeletal: M/S 5/5 throughout.  No significant right lower extremity edema.  2+ left lower extremity edema.  Marked bruising throughout the left lower leg.  Several centimeter circular ulceration on the lateral aspect of the left calf with serosanguineous drainage.  This is likely from skin necrosis and expression of the hematoma.  No significant surrounding erythema. Neurologic: Sensation grossly intact in extremities.  Symmetrical.  Speech is fluent. Motor exam as listed above. Psychiatric: Judgment intact, Mood & affect appropriate for pt's clinical situation. Dermatologic: Open wound on the left calf as above    Radiology Dg Chest 2 View  Result Date: 06/18/2018 CLINICAL DATA:  Pt states she tripped and fell yesterday while at church and was seen here, but pt states she continues to have right shoulder pain and pain to the left lower lateral leg with noted swelling. H/o cervical cancer, COPD, CAD, HTN, former smoker EXAM: CHEST - 2 VIEW COMPARISON:  05/21/2009 FINDINGS: Coarse bibasilar and perihilar bronchovascular markings as before. No confluent airspace disease or overt edema. Heart size upper limits normal. Aortic Atherosclerosis (ICD10-170.0). No pneumothorax.  No effusion. Vertebral endplate spurring at multiple levels in the mid and lower thoracic spine. IMPRESSION: 1. Stable chronic interstitial changes.  No acute disease. Electronically Signed   By: Lucrezia Europe  M.D.   On: 06/18/2018 10:52   Dg Shoulder Right  Result Date: 06/18/2018 CLINICAL DATA:  77 year old female post fall. Shoulder pain. Initial encounter. EXAM: RIGHT SHOULDER - 2+ VIEW COMPARISON:  06/18/2018 chest x-ray. FINDINGS: Moderate acromioclavicular joint degenerative changes. Soft tissue calcifications along the distal aspect of the rotator cuff. No fracture or dislocation. Visualized lungs clear. IMPRESSION: 1. No fracture or dislocation. 2. Moderate acromioclavicular joint degenerative changes. 3. Soft tissue calcifications along the distal aspect of the rotator cuff. Electronically Signed   By: Genia Del M.D.   On: 06/18/2018 10:58   Dg Tibia/fibula Left  Result Date: 06/18/2018 CLINICAL DATA:  77 year old female post fall. Leg pain. Initial encounter. EXAM: LEFT TIBIA AND FIBULA - 2 VIEW COMPARISON:  04/21/2014 CT. FINDINGS: No fracture or dislocation. Prominent soft tissue may be normal for this patient although soft tissue injury not excluded. IMPRESSION: 1. No fracture or dislocation. 2. Prominent soft tissue may be normal for this patient although soft tissue injury not excluded. Electronically Signed   By: Genia Del M.D.   On: 06/18/2018 10:57   Ct Head Wo Contrast  Result Date: 06/17/2018 CLINICAL DATA:  Status post fall today with a blow to the back of the head. Initial encounter. EXAM: CT HEAD WITHOUT CONTRAST TECHNIQUE: Contiguous axial images were obtained from the base of the skull through the vertex without intravenous contrast. COMPARISON:  None. FINDINGS: Brain: No evidence of acute infarction, hemorrhage, hydrocephalus, extra-axial collection or mass lesion/mass effect. Mild atrophy and chronic microvascular ischemic change noted. Vascular: No hyperdense vessel or unexpected calcification. Skull: Intact.  No focal lesion. Sinuses/Orbits: Negative. Other: Small appearing scalp hematoma posteriorly noted. IMPRESSION: Posterior scalp hematoma without underlying  fracture or acute intracranial abnormality. Mild atrophy and chronic microvascular ischemic change. Electronically Signed   By: Inge Rise M.D.   On: 06/17/2018 14:05    Labs Recent Results (from the past 2160 hour(s))  Brain natriuretic peptide     Status: None   Collection Time: 04/23/18 10:45 AM  Result Value Ref Range   B Natriuretic Peptide 28.0 0.0 - 100.0 pg/mL    Comment: Performed at [facility], [address] [place], [place] [address]  Protime-INR     Status: Abnormal   Collection Time: 06/17/18  1:34 PM  Result Value Ref Range   Prothrombin Time 35.9 (H) 11.4 - 15.2 seconds   INR 3.67     Comment: Performed at [facility], [address] [place], [place] [address]    Assessment/Plan:  Hypertension blood pressure control important in reducing the progression of atherosclerotic disease. On appropriate oral medications.   Coronary artery disease Continue cardiac and antihypertensive medications as already ordered and reviewed, no changes at this time.  Continue statin as ordered and reviewed, no changes at this time  Nitrates PRN for chest pain   Edema The patient has had issues with chronic edema, but her left leg is much more swollen secondary to the hematoma and the resultant inflammation.  The Unna boot will help control the swelling as well as heal the wound.  We will reassess this in several weeks after weekly Unna boot placements.  Lower limb ulcer, calf, left, with fat layer exposed ([place]) The patient now has skin necrosis with a nonhealing wound of the left lateral calf.  A 3 layer Unna boot was placed today and will be changed weekly.  I suspect this will be necessary for 1 to 2 months and she may ultimately require skin graft depending on how large the defect is when all is said and done.  I have discussed the importance of leg elevation in controlling of the swelling.  The patient will return to see me in about a month and will  get weekly Unna boots up until that time.      Leotis Pain 07/13/2018, 10:05 AM   This note was created with Dragon medical transcription system.  Any errors from dictation are unintentional.

## 2018-07-13 NOTE — Assessment & Plan Note (Signed)
Continue cardiac and antihypertensive medications as already ordered and reviewed, no changes at this time. Continue statin as ordered and reviewed, no changes at this time Nitrates PRN for chest pain  

## 2018-07-17 ENCOUNTER — Telehealth (INDEPENDENT_AMBULATORY_CARE_PROVIDER_SITE_OTHER): Payer: Self-pay

## 2018-07-17 ENCOUNTER — Ambulatory Visit (INDEPENDENT_AMBULATORY_CARE_PROVIDER_SITE_OTHER): Payer: Medicare Other | Admitting: Nurse Practitioner

## 2018-07-17 ENCOUNTER — Encounter (INDEPENDENT_AMBULATORY_CARE_PROVIDER_SITE_OTHER): Payer: Self-pay

## 2018-07-17 VITALS — BP 124/69 | HR 87 | Resp 16

## 2018-07-17 DIAGNOSIS — L97222 Non-pressure chronic ulcer of left calf with fat layer exposed: Secondary | ICD-10-CM | POA: Diagnosis not present

## 2018-07-17 NOTE — Telephone Encounter (Signed)
Have her come in for an unna wrap change today. Thanks

## 2018-07-17 NOTE — Telephone Encounter (Signed)
Can you call the patient to come in today for unna wrap change

## 2018-07-17 NOTE — Progress Notes (Signed)
History of Present Illness  There is no documented history at this time  Assessments & Plan   There are no diagnoses linked to this encounter.    Additional instructions  Subjective:  Patient presents with venous ulcer of the Left lower extremity.    Procedure:  3 layer unna wrap was placed Left lower extremity.   Plan:   Follow up in one week.  

## 2018-07-17 NOTE — Telephone Encounter (Signed)
Patient will be coming in the office today for a unna wrap change

## 2018-07-20 ENCOUNTER — Encounter (INDEPENDENT_AMBULATORY_CARE_PROVIDER_SITE_OTHER): Payer: Medicare Other

## 2018-07-25 ENCOUNTER — Ambulatory Visit (INDEPENDENT_AMBULATORY_CARE_PROVIDER_SITE_OTHER): Payer: Medicare Other | Admitting: Nurse Practitioner

## 2018-07-25 ENCOUNTER — Encounter (INDEPENDENT_AMBULATORY_CARE_PROVIDER_SITE_OTHER): Payer: Self-pay | Admitting: Nurse Practitioner

## 2018-07-25 VITALS — BP 115/69 | HR 91 | Resp 17 | Ht 64.0 in | Wt 184.0 lb

## 2018-07-25 DIAGNOSIS — L97222 Non-pressure chronic ulcer of left calf with fat layer exposed: Secondary | ICD-10-CM | POA: Diagnosis not present

## 2018-07-25 NOTE — Progress Notes (Signed)
History of Present Illness  There is no documented history at this time  Assessments & Plan   There are no diagnoses linked to this encounter.    Additional instructions  Subjective:  Patient presents with venous ulcer of the Left lower extremity.    Procedure:  3 layer unna wrap was placed Left lower extremity.   Plan:   Follow up in one week.  

## 2018-08-03 ENCOUNTER — Ambulatory Visit (INDEPENDENT_AMBULATORY_CARE_PROVIDER_SITE_OTHER): Payer: Medicare Other | Admitting: Nurse Practitioner

## 2018-08-03 ENCOUNTER — Encounter (INDEPENDENT_AMBULATORY_CARE_PROVIDER_SITE_OTHER): Payer: Self-pay

## 2018-08-03 VITALS — BP 120/59 | HR 93 | Resp 20 | Ht 64.0 in | Wt 187.0 lb

## 2018-08-03 DIAGNOSIS — L97222 Non-pressure chronic ulcer of left calf with fat layer exposed: Secondary | ICD-10-CM

## 2018-08-03 NOTE — Progress Notes (Signed)
History of Present Illness  There is no documented history at this time  Assessments & Plan   There are no diagnoses linked to this encounter.    Additional instructions  Subjective:  Patient presents with venous ulcer of the Left lower extremity.    Procedure:  3 layer unna wrap was placed Left lower extremity.   Plan:   Follow up in one week.  

## 2018-08-06 ENCOUNTER — Encounter (INDEPENDENT_AMBULATORY_CARE_PROVIDER_SITE_OTHER): Payer: Self-pay | Admitting: Nurse Practitioner

## 2018-08-10 ENCOUNTER — Ambulatory Visit (INDEPENDENT_AMBULATORY_CARE_PROVIDER_SITE_OTHER): Payer: Medicare Other | Admitting: Nurse Practitioner

## 2018-08-10 ENCOUNTER — Encounter (INDEPENDENT_AMBULATORY_CARE_PROVIDER_SITE_OTHER): Payer: Self-pay | Admitting: Nurse Practitioner

## 2018-08-10 VITALS — BP 125/64 | HR 98 | Resp 16 | Ht 64.0 in | Wt 187.6 lb

## 2018-08-10 DIAGNOSIS — I10 Essential (primary) hypertension: Secondary | ICD-10-CM | POA: Diagnosis not present

## 2018-08-10 DIAGNOSIS — L97222 Non-pressure chronic ulcer of left calf with fat layer exposed: Secondary | ICD-10-CM | POA: Diagnosis not present

## 2018-08-10 DIAGNOSIS — R609 Edema, unspecified: Secondary | ICD-10-CM

## 2018-08-10 NOTE — Progress Notes (Signed)
Subjective:    Patient ID: Carol Riley, female    DOB: 1941/03/20, 77 y.o.   MRN: [id number] Chief Complaint  Patient presents with  . Follow-up    [place] CHECK LEFT LEG    HPI  Carol Riley is a 77 y.o. female that had an accidental fall on 06/17/2018.  After the fall she injured her leg which resulted in an open wound as well as extensive bruising and hematoma.  Today after 4 weeks of Unna wraps the swelling is greatly reduced to her leg however immediately under the wound there is still a proximately 4 inch hematoma left.  The wound itself is approximately the size of a quarter.  The patient denies any issues with currently wearing Unna wraps.  Her only complaint is the inability to shower regularly.  The patient denies any fever, chills, nausea, vomiting or diarrhea.  She denies any chest pain or shortness of breath.  She denies any claudication-like symptoms or rest pain.  Past Medical History:  Diagnosis Date  . Arthritis   . Cancer (HCC)    cervical cancer  . Cataract cortical, senile   . COPD (chronic obstructive pulmonary disease) ([place])   . Coronary artery disease   . Depression   . DVT (deep venous thrombosis) ([place])   . Edema    FEET/LEGS  . Hyperglycemia   . Hypertension   . Leukopenia   . Neuropathy   . Shortness of breath dyspnea   . Sleep apnea   . Thrombocytopenia ([place])     Past Surgical History:  Procedure Laterality Date  . BACK SURGERY    . BREAST BIOPSY Left    benign needle  . COLONOSCOPY    . COLONOSCOPY WITH PROPOFOL N/A 02/24/2017   Procedure: COLONOSCOPY WITH PROPOFOL;  Surgeon: Lollie Sails, MD;  Location: [facility] ENDOSCOPY;  Service: Endoscopy;  Laterality: N/A;  . CORONARY ANGIOPLASTY     STENT  . EYE SURGERY    . GAS INSERTION Right 05/06/2015   Procedure: INSERTION OF GAS;  Surgeon: Milus Height, MD;  Location: [facility] ORS;  Service: Ophthalmology;  Laterality: Right;  . MEMBRANE PEEL Right 05/06/2015   Procedure:  MEMBRANE PEEL;  Surgeon: Milus Height, MD;  Location: [facility] ORS;  Service: Ophthalmology;  Laterality: Right;  . PARS PLANA VITRECTOMY Right 05/06/2015   Procedure: PARS PLANA VITRECTOMY WITH 23 GAUGE;  Surgeon: Milus Height, MD;  Location: [facility] ORS;  Service: Ophthalmology;  Laterality: Right;    Social History   Socioeconomic History  . Marital status: Divorced    Spouse name: Not on file  . Number of children: Not on file  . Years of education: Not on file  . Highest education level: Not on file  Occupational History  . Not on file  Social Needs  . Financial resource strain: Not on file  . Food insecurity:    Worry: Not on file    Inability: Not on file  . Transportation needs:    Medical: Not on file    Non-medical: Not on file  Tobacco Use  . Smoking status: Former Research scientist (life sciences)  . Smokeless tobacco: Never Used  Substance and Sexual Activity  . Alcohol use: No  . Drug use: No  . Sexual activity: Not on file  Lifestyle  . Physical activity:    Days per week: Not on file    Minutes per session: Not on file  . Stress: Not on file  Relationships  . Social  connections:    Talks on phone: Not on file    Gets together: Not on file    Attends religious service: Not on file    Active member of club or organization: Not on file    Attends meetings of clubs or organizations: Not on file    Relationship status: Not on file  . Intimate partner violence:    Fear of current or ex partner: Not on file    Emotionally abused: Not on file    Physically abused: Not on file    Forced sexual activity: Not on file  Other Topics Concern  . Not on file  Social History Narrative  . Not on file    Family History  Problem Relation Age of Onset  . Breast cancer Sister 58  . Cancer Mother     Allergies  Allergen Reactions  . Pneumovax [Pneumococcal Polysaccharide Vaccine]      Review of Systems   Review of Systems: Negative Unless Checked Constitutional: [] Weight loss   [] Fever  [] Chills Cardiac: [] Chest pain   []  Atrial Fibrillation  [] Palpitations   [] Shortness of breath when laying flat   [] Shortness of breath with exertion. Vascular:  [] Pain in legs with walking   [] Pain in legs with standing  [x] History of DVT   [] Phlebitis   [] Swelling in legs   [] Varicose veins   [] Non-healing ulcers Pulmonary:   [] Uses home oxygen   [] Productive cough   [] Hemoptysis   [] Wheeze  [x] COPD   [] Asthma Neurologic:  [] Dizziness   [] Seizures   [] History of stroke   [] History of TIA  [] Aphasia   [] Vissual changes   [] Weakness or numbness in arm   [] Weakness or numbness in leg Musculoskeletal:   [] Joint swelling   [x] Joint pain   [x] Low back pain  []  History of Knee Replacement Hematologic:  [x] Easy bruising  [] Easy bleeding   [] Hypercoagulable state   [] Anemic Gastrointestinal:  [] Diarrhea   [] Vomiting  [] Gastroesophageal reflux/heartburn   [] Difficulty swallowing. Genitourinary:  [] Chronic kidney disease   [] Difficult urination  [] Anuric   [] Blood in urine Skin:  [] Rashes   [x] Ulcers  Psychological:  [] History of anxiety   []  History of major depression  []  Memory Difficulties     Objective:   Physical Exam  BP 125/64 (BP Location: Right Arm, Patient Position: Sitting)   Pulse 98   Resp 16   Ht 5\' 4"  (1.626 m)   Wt 187 lb 9.6 oz (85.1 kg)   BMI 32.20 kg/m   Gen: WD/WN, NAD Head: [place]/AT, No temporalis wasting.  Ear/Nose/Throat: Hearing grossly intact, nares w/o erythema or drainage Eyes: PER, EOMI, sclera nonicteric.  Neck: Supple, no masses.  No JVD.  Pulmonary:  Good air movement, no use of accessory muscles.  Cardiac: RRR Vascular:  Quarter sized wound on l left posterior calf.  Area of swelling underneath that wound.  Scattered spider varicosities. Vessel Right Left  Radial Palpable Palpable  Dorsalis Pedis Palpable Palpable  Posterior Tibial Palpable  not palpable   Gastrointestinal: soft, non-distended. No guarding/no peritoneal signs.  Musculoskeletal: M/S  5/5 throughout.  No deformity or atrophy.  Neurologic: Pain and light touch intact in extremities.  Symmetrical.  Speech is fluent. Motor exam as listed above. Psychiatric: Judgment intact, Mood & affect appropriate for pt's clinical situation. Dermatologic: No Venous rashes. No Ulcers Noted.  No changes consistent with cellulitis. Lymph : No Cervical lymphadenopathy, no lichenification or skin changes of chronic lymphedema.      Assessment & Plan:  1. Lower limb ulcer, calf, left, with fat layer exposed ([place]) After falling about 6 to 7 weeks ago the patient's bruising has cleared greatly however the wound on her left posterior calf is still open and tender.  The patient still has a moderate amount of swelling in that area, which would suggest hematoma.  We will keep the patient in wraps for 4 more weeks to see if there is allows the wound more time to decrease in size.  If at the follow-up appointment there has not been very much change in her wound we will refer to wound care.  We will also obtain bilateral ABIs on her next wound check visit to ensure that she has adequate blood flow to allow this wound to heal. - VAS [place] ABI WITH/WO TBI; Future  2. Essential hypertension Continue antihypertensive medications as already ordered, these medications have been reviewed and there are no changes at this time.   3. Edema, unspecified type Patient still has resulted edema following a fall on June 17, 2018.  We will place the patient in the left lower extremity Unna wraps to be changed weekly drainage permitting.  We will have a follow-up in 4 weeks to check progression of the wound as detailed above.   Current Outpatient Medications on File Prior to Visit  Medication Sig Dispense Refill  . acetaminophen (TYLENOL) 325 MG tablet Take 650 mg by mouth every 6 (six) hours as needed.    Jearl Klinefelter ELLIPTA 62.5-25 MCG/INH AEPB INHALE 1 PUFF INTO THE LUNGS ONCE DAILY  4  . aspirin EC 81 MG tablet Take  81 mg by mouth daily.    . Cholecalciferol 1000 units tablet Take 1,000 Units by mouth daily.    . citalopram (CELEXA) 20 MG tablet Take 20 mg by mouth daily.    . clobetasol ointment (TEMOVATE) 1.61 % Apply 1 application topically 2 (two) times daily.    Marland Kitchen gabapentin (NEURONTIN) 400 MG capsule Take 400 mg by mouth 2 (two) times daily.    Marland Kitchen lisinopril (PRINIVIL,ZESTRIL) 40 MG tablet Take 40 mg by mouth daily.    . metoprolol succinate (TOPROL-XL) 25 MG 24 hr tablet Take 12.5 mg by mouth daily.    . Multiple Vitamin (MULTIVITAMIN) tablet Take 1 tablet by mouth daily.    . simvastatin (ZOCOR) 20 MG tablet Take 20 mg by mouth daily.    . VENTOLIN HFA 108 (90 Base) MCG/ACT inhaler INHALE 2 PUFFS BY MOUTH EVERY 6 HOURS AS NEEDED FOR WHEEZE  12  . warfarin (COUMADIN) 1 MG tablet Take 1 mg by mouth daily.     Marland Kitchen warfarin (COUMADIN) 5 MG tablet Take 5 mg by mouth daily.      No current facility-administered medications on file prior to visit.     There are no Patient Instructions on file for this visit. No follow-ups on file.   Kris Hartmann, NP  This note was completed with Sales executive.  Any errors are purely unintentional.

## 2018-08-17 ENCOUNTER — Ambulatory Visit (INDEPENDENT_AMBULATORY_CARE_PROVIDER_SITE_OTHER): Payer: Medicare Other | Admitting: Nurse Practitioner

## 2018-08-17 ENCOUNTER — Encounter (INDEPENDENT_AMBULATORY_CARE_PROVIDER_SITE_OTHER): Payer: Self-pay

## 2018-08-17 VITALS — BP 139/69 | HR 83 | Resp 12 | Ht 64.0 in | Wt 188.6 lb

## 2018-08-17 DIAGNOSIS — L97222 Non-pressure chronic ulcer of left calf with fat layer exposed: Secondary | ICD-10-CM | POA: Diagnosis not present

## 2018-08-17 NOTE — Progress Notes (Signed)
History of Present Illness  There is no documented history at this time  Assessments & Plan   There are no diagnoses linked to this encounter.    Additional instructions  Subjective:  Patient presents with venous ulcer of the Left lower extremity.    Procedure:  3 layer unna wrap was placed Left lower extremity.   Plan:   Follow up in one week.  

## 2018-08-24 ENCOUNTER — Encounter (INDEPENDENT_AMBULATORY_CARE_PROVIDER_SITE_OTHER): Payer: Self-pay

## 2018-08-24 ENCOUNTER — Ambulatory Visit (INDEPENDENT_AMBULATORY_CARE_PROVIDER_SITE_OTHER): Payer: Medicare Other | Admitting: Nurse Practitioner

## 2018-08-24 VITALS — BP 135/73 | HR 91 | Resp 12 | Ht 64.0 in | Wt 190.0 lb

## 2018-08-24 DIAGNOSIS — L97222 Non-pressure chronic ulcer of left calf with fat layer exposed: Secondary | ICD-10-CM | POA: Diagnosis not present

## 2018-08-24 NOTE — Progress Notes (Signed)
History of Present Illness  There is no documented history at this time  Assessments & Plan   There are no diagnoses linked to this encounter.    Additional instructions  Subjective:  Patient presents with venous ulcer of the Left lower extremity.    Procedure:  3 layer unna wrap was placed Left lower extremity.   Plan:   Follow up in one week.  

## 2018-08-30 ENCOUNTER — Ambulatory Visit (INDEPENDENT_AMBULATORY_CARE_PROVIDER_SITE_OTHER): Payer: Medicare Other | Admitting: Nurse Practitioner

## 2018-08-30 ENCOUNTER — Ambulatory Visit (INDEPENDENT_AMBULATORY_CARE_PROVIDER_SITE_OTHER): Payer: Medicare Other

## 2018-08-30 ENCOUNTER — Encounter (INDEPENDENT_AMBULATORY_CARE_PROVIDER_SITE_OTHER): Payer: Medicare Other

## 2018-08-30 ENCOUNTER — Encounter (INDEPENDENT_AMBULATORY_CARE_PROVIDER_SITE_OTHER): Payer: Self-pay

## 2018-08-30 ENCOUNTER — Encounter (INDEPENDENT_AMBULATORY_CARE_PROVIDER_SITE_OTHER): Payer: Self-pay | Admitting: Nurse Practitioner

## 2018-08-30 VITALS — BP 135/76 | HR 76 | Resp 14 | Ht 64.0 in | Wt 192.4 lb

## 2018-08-30 DIAGNOSIS — I10 Essential (primary) hypertension: Secondary | ICD-10-CM | POA: Diagnosis not present

## 2018-08-30 DIAGNOSIS — L97222 Non-pressure chronic ulcer of left calf with fat layer exposed: Secondary | ICD-10-CM

## 2018-08-30 DIAGNOSIS — R609 Edema, unspecified: Secondary | ICD-10-CM | POA: Diagnosis not present

## 2018-08-30 NOTE — Progress Notes (Signed)
Subjective:    Patient ID: Carol Riley, female    DOB: [birth year], 78 y.o.   MRN: [id number] Chief Complaint  Patient presents with  . Follow-up    HPI  Carol Riley is a 78 y.o. female that presents today for evaluation of a left lower extremity wound following a fall.  Despite being previously in a wraps the patient still had a large quarter size ulceration with a significant hematoma underneath.  Patient also endorses numbness to her bilateral feet at this time.  Today, the ulceration appears much better there is still a small superficial wound which is doing some bleeding.  The hematoma has significantly decreased in size as well.  The patient reports doing okay with Unna wrap therapy however it is more of an inconvenience than anything.  Patient denies any fever, chills, nausea, vomiting or diarrhea.  Patient denies any chest pain or shortness of breath.  She denies any TIA-like symptoms or amaurosis fugax.  Patient also underwent bilateral ABIs to ensure that she had adequate blood flow to allow for wound healing.  Her right ABI is 1.06 and her left is 1.02.  She had triphasic tibial waveforms bilaterally.  She has strong toe waveforms bilaterally.  There is no previous study to compare this to.  Past Medical History:  Diagnosis Date  . Arthritis   . Cancer (HCC)    cervical cancer  . Cataract cortical, senile   . COPD (chronic obstructive pulmonary disease) ([place])   . Coronary artery disease   . Depression   . DVT (deep venous thrombosis) ([place])   . Edema    FEET/LEGS  . Hyperglycemia   . Hypertension   . Leukopenia   . Neuropathy   . Shortness of breath dyspnea   . Sleep apnea   . Thrombocytopenia ([place])     Past Surgical History:  Procedure Laterality Date  . BACK SURGERY    . BREAST BIOPSY Left    benign needle  . COLONOSCOPY    . COLONOSCOPY WITH PROPOFOL N/A 02/24/2017   Procedure: COLONOSCOPY WITH PROPOFOL;  Surgeon: Lollie Sails, MD;  Location:  [facility] ENDOSCOPY;  Service: Endoscopy;  Laterality: N/A;  . CORONARY ANGIOPLASTY     STENT  . EYE SURGERY    . GAS INSERTION Right 05/06/2015   Procedure: INSERTION OF GAS;  Surgeon: Milus Height, MD;  Location: [facility] ORS;  Service: Ophthalmology;  Laterality: Right;  . MEMBRANE PEEL Right 05/06/2015   Procedure: MEMBRANE PEEL;  Surgeon: Milus Height, MD;  Location: [facility] ORS;  Service: Ophthalmology;  Laterality: Right;  . PARS PLANA VITRECTOMY Right 05/06/2015   Procedure: PARS PLANA VITRECTOMY WITH 23 GAUGE;  Surgeon: Milus Height, MD;  Location: [facility] ORS;  Service: Ophthalmology;  Laterality: Right;    Social History   Socioeconomic History  . Marital status: Divorced    Spouse name: Not on file  . Number of children: Not on file  . Years of education: Not on file  . Highest education level: Not on file  Occupational History  . Not on file  Social Needs  . Financial resource strain: Not on file  . Food insecurity:    Worry: Not on file    Inability: Not on file  . Transportation needs:    Medical: Not on file    Non-medical: Not on file  Tobacco Use  . Smoking status: Former Research scientist (life sciences)  . Smokeless tobacco: Never Used  Substance and Sexual Activity  . Alcohol use:  No  . Drug use: No  . Sexual activity: Not on file  Lifestyle  . Physical activity:    Days per week: Not on file    Minutes per session: Not on file  . Stress: Not on file  Relationships  . Social connections:    Talks on phone: Not on file    Gets together: Not on file    Attends religious service: Not on file    Active member of club or organization: Not on file    Attends meetings of clubs or organizations: Not on file    Relationship status: Not on file  . Intimate partner violence:    Fear of current or ex partner: Not on file    Emotionally abused: Not on file    Physically abused: Not on file    Forced sexual activity: Not on file  Other Topics Concern  . Not on file  Social History  Narrative  . Not on file    Family History  Problem Relation Age of Onset  . Breast cancer Sister 20  . Cancer Mother     Allergies  Allergen Reactions  . Pneumovax [Pneumococcal Polysaccharide Vaccine]      Review of Systems   Review of Systems: Negative Unless Checked Constitutional: [] Weight loss  [] Fever  [] Chills Cardiac: [] Chest pain   []  Atrial Fibrillation  [] Palpitations   [] Shortness of breath when laying flat   [] Shortness of breath with exertion. [] Shortness of breath at rest Vascular:  [] Pain in legs with walking   [] Pain in legs with standing [] Pain in legs when laying flat   [] Claudication    [] Pain in feet when laying flat    [x] History of DVT   [] Phlebitis   [x] Swelling in legs   [x] Varicose veins   [] Non-healing ulcers Pulmonary:   [] Uses home oxygen   [] Productive cough   [] Hemoptysis   [] Wheeze  [x] COPD   [] Asthma Neurologic:  [] Dizziness   [] Seizures  [] Blackouts [] History of stroke   [] History of TIA  [] Aphasia   [] Temporary Blindness   [] Weakness or numbness in arm   [] Weakness or numbness in leg Musculoskeletal:   [] Joint swelling   [] Joint pain   [] Low back pain  []  History of Knee Replacement [] Arthritis [] back Surgeries  []  Spinal Stenosis    Hematologic:  [x] Easy bruising  [] Easy bleeding   [] Hypercoagulable state   [x] Anemic Gastrointestinal:  [] Diarrhea   [] Vomiting  [] Gastroesophageal reflux/heartburn   [] Difficulty swallowing. [] Abdominal pain Genitourinary:  [] Chronic kidney disease   [] Difficult urination  [] Anuric   [] Blood in urine [] Frequent urination  [] Burning with urination   [] Hematuria Skin:  [] Rashes   [] Ulcers [] Wounds Psychological:  [] History of anxiety   [x]  History of major depression  []  Memory Difficulties     Objective:   Physical Exam  BP 135/76 (BP Location: Right Arm, Patient Position: Sitting)   Pulse 76   Resp 14   Ht 5\' 4"  (1.626 m)   Wt 192 lb 6.4 oz (87.3 kg)   BMI 33.03 kg/m   Gen: WD/WN, NAD Head: /AT, No  temporalis wasting.  Ear/Nose/Throat: Hearing grossly intact, nares w/o erythema or drainage Eyes: PER, EOMI, sclera nonicteric.  Neck: Supple, no masses.  No JVD.  Pulmonary:  Good air movement, no use of accessory muscles.  Cardiac: RRR Vascular:  Small superficial ulceration on left calf, some edema present underneath from previous hematoma that is still present Vessel Right Left  Radial Palpable Palpable  Dorsalis Pedis Palpable  Palpable  Posterior Tibial Palpable Palpable   Gastrointestinal: soft, non-distended. No guarding/no peritoneal signs.  Musculoskeletal: M/S 5/5 throughout.  No deformity or atrophy.  Neurologic: Pain and light touch intact in extremities.  Symmetrical.  Speech is fluent. Motor exam as listed above. Psychiatric: Judgment intact, Mood & affect appropriate for pt's clinical situation. Dermatologic:  Bilateral stasis dermatitis.  Small superficial ulceration on left calf noted.  No changes consistent with cellulitis. Lymph : No Cervical lymphadenopathy, no lichenification or skin changes of chronic lymphedema.      Assessment & Plan:   1. Lower limb ulcer, calf, left, with fat layer exposed ([place]) Patient also underwent bilateral ABIs to ensure that she had adequate blood flow to allow for wound healing.  Her right ABI is 1.06 and her left is 1.02.  She had triphasic tibial waveforms bilaterally.  She has strong toe waveforms bilaterally.  There is no previous study to compare this to.  In speaking with the patient, she would prefer not to go back into the wraps if she did not have to.  I have instructed the patient to place a little bit of bacitracin ointment over the wound daily and cover with a Band-Aid.  I have advised her that she can shower however to be gentle with this area and avoid scrubbing.  I advised her that if her swelling becomes unmanageable or she begins to ooze continuously that she should contact our office in order to have little wraps were  placed.  Otherwise, I will follow-up with her in 6 weeks to look at wound healing progression.  2. Essential hypertension Continue antihypertensive medications as already ordered, these medications have been reviewed and there are no changes at this time.   3. Edema, unspecified type The patient has a hematoma still underlying her wound.  Otherwise there is no edema in her lower extremity.  Advised the patient that if her hematoma begins to get larger or if it begins to drain copiously, she should contact her office sooner than 6 weeks to have [place] replace Unna wraps.  Patient agreed and understood.   Current Outpatient Medications on File Prior to Visit  Medication Sig Dispense Refill  . acetaminophen (TYLENOL) 325 MG tablet Take 650 mg by mouth every 6 (six) hours as needed.    Jearl Klinefelter ELLIPTA 62.5-25 MCG/INH AEPB INHALE 1 PUFF INTO THE LUNGS ONCE DAILY  4  . aspirin EC 81 MG tablet Take 81 mg by mouth daily.    . Cholecalciferol 1000 units tablet Take 1,000 Units by mouth daily.    . citalopram (CELEXA) 20 MG tablet Take 20 mg by mouth daily.    Marland Kitchen gabapentin (NEURONTIN) 400 MG capsule Take 400 mg by mouth 2 (two) times daily.    Marland Kitchen lisinopril (PRINIVIL,ZESTRIL) 40 MG tablet Take 40 mg by mouth daily.    . metoprolol succinate (TOPROL-XL) 25 MG 24 hr tablet Take 12.5 mg by mouth daily.    . Multiple Vitamin (MULTIVITAMIN) tablet Take 1 tablet by mouth daily.    . simvastatin (ZOCOR) 20 MG tablet Take 20 mg by mouth daily.    . VENTOLIN HFA 108 (90 Base) MCG/ACT inhaler INHALE 2 PUFFS BY MOUTH EVERY 6 HOURS AS NEEDED FOR WHEEZE  12  . warfarin (COUMADIN) 1 MG tablet Take 1 mg by mouth daily.     Marland Kitchen warfarin (COUMADIN) 5 MG tablet Take 5 mg by mouth daily.      No current facility-administered medications on file prior  to visit.     There are no Patient Instructions on file for this visit. No follow-ups on file.   Kris Hartmann, NP  This note was completed with Sales executive.  Any  errors are purely unintentional.

## 2018-08-31 ENCOUNTER — Ambulatory Visit (INDEPENDENT_AMBULATORY_CARE_PROVIDER_SITE_OTHER): Payer: Medicare Other | Admitting: Nurse Practitioner

## 2018-09-06 ENCOUNTER — Encounter (INDEPENDENT_AMBULATORY_CARE_PROVIDER_SITE_OTHER): Payer: Medicare Other

## 2018-09-06 ENCOUNTER — Ambulatory Visit (INDEPENDENT_AMBULATORY_CARE_PROVIDER_SITE_OTHER): Payer: Medicare Other | Admitting: Nurse Practitioner

## 2018-10-11 ENCOUNTER — Other Ambulatory Visit: Payer: Self-pay

## 2018-10-11 ENCOUNTER — Encounter (INDEPENDENT_AMBULATORY_CARE_PROVIDER_SITE_OTHER): Payer: Self-pay | Admitting: Nurse Practitioner

## 2018-10-11 ENCOUNTER — Ambulatory Visit (INDEPENDENT_AMBULATORY_CARE_PROVIDER_SITE_OTHER): Payer: Medicare Other | Admitting: Nurse Practitioner

## 2018-10-11 VITALS — BP 152/73 | HR 82 | Resp 18 | Ht 64.0 in | Wt 195.0 lb

## 2018-10-11 DIAGNOSIS — I890 Lymphedema, not elsewhere classified: Secondary | ICD-10-CM

## 2018-10-11 DIAGNOSIS — I10 Essential (primary) hypertension: Secondary | ICD-10-CM

## 2018-10-11 DIAGNOSIS — L97222 Non-pressure chronic ulcer of left calf with fat layer exposed: Secondary | ICD-10-CM | POA: Diagnosis not present

## 2018-10-23 ENCOUNTER — Encounter (INDEPENDENT_AMBULATORY_CARE_PROVIDER_SITE_OTHER): Payer: Self-pay | Admitting: Nurse Practitioner

## 2018-10-23 DIAGNOSIS — I890 Lymphedema, not elsewhere classified: Secondary | ICD-10-CM | POA: Insufficient documentation

## 2018-10-23 NOTE — Progress Notes (Signed)
SUBJECTIVE:  Patient ID: Carol Riley, female    DOB: December 21, 1940, 78 y.o.   MRN: [id number] Chief Complaint  Patient presents with  . Follow-up    6 week f/u no studies    HPI  Carol Riley is a 78 y.o. female that presents for 6-week follow-up and evaluation of swelling.  The patient initially had a very large ulcer on her left lower extremity due to a traumatic fall.  At one point it was concerned that she may need a skin graft to fully close the wound.  However at this time the wound has healed completely, save for some swelling near the lateral portion of the calf.  Patient has been utilizing medical grade 1 compression therapy on a daily basis.  She places her stockings on in the morning removes them at night.  She also utilizes elevation as much as possible.  She also remains active several days of the week.  Patient denies any fever, chills, nausea, vomiting or diarrhea.  She denies any chest pain or shortness of breath.  Despite healing ulceration she still remains with consistent swelling.  Previous hematoma that was in that leg has gone.  Past Medical History:  Diagnosis Date  . Arthritis   . Cancer (HCC)    cervical cancer  . Cataract cortical, senile   . COPD (chronic obstructive pulmonary disease) ([place])   . Coronary artery disease   . Depression   . DVT (deep venous thrombosis) ([place])   . Edema    FEET/LEGS  . Hyperglycemia   . Hypertension   . Leukopenia   . Neuropathy   . Shortness of breath dyspnea   . Sleep apnea   . Thrombocytopenia ([place])     Past Surgical History:  Procedure Laterality Date  . BACK SURGERY    . BREAST BIOPSY Left    benign needle  . COLONOSCOPY    . COLONOSCOPY WITH PROPOFOL N/A 02/24/2017   Procedure: COLONOSCOPY WITH PROPOFOL;  Surgeon: Lollie Sails, MD;  Location: [facility] ENDOSCOPY;  Service: Endoscopy;  Laterality: N/A;  . CORONARY ANGIOPLASTY     STENT  . EYE SURGERY    . GAS INSERTION Right 05/06/2015   Procedure:  INSERTION OF GAS;  Surgeon: Milus Height, MD;  Location: [facility] ORS;  Service: Ophthalmology;  Laterality: Right;  . MEMBRANE PEEL Right 05/06/2015   Procedure: MEMBRANE PEEL;  Surgeon: Milus Height, MD;  Location: [facility] ORS;  Service: Ophthalmology;  Laterality: Right;  . PARS PLANA VITRECTOMY Right 05/06/2015   Procedure: PARS PLANA VITRECTOMY WITH 23 GAUGE;  Surgeon: Milus Height, MD;  Location: [facility] ORS;  Service: Ophthalmology;  Laterality: Right;    Social History   Socioeconomic History  . Marital status: Divorced    Spouse name: Not on file  . Number of children: Not on file  . Years of education: Not on file  . Highest education level: Not on file  Occupational History  . Not on file  Social Needs  . Financial resource strain: Not on file  . Food insecurity:    Worry: Not on file    Inability: Not on file  . Transportation needs:    Medical: Not on file    Non-medical: Not on file  Tobacco Use  . Smoking status: Former Research scientist (life sciences)  . Smokeless tobacco: Never Used  Substance and Sexual Activity  . Alcohol use: No  . Drug use: No  . Sexual activity: Not on file  Lifestyle  . Physical  activity:    Days per week: Not on file    Minutes per session: Not on file  . Stress: Not on file  Relationships  . Social connections:    Talks on phone: Not on file    Gets together: Not on file    Attends religious service: Not on file    Active member of club or organization: Not on file    Attends meetings of clubs or organizations: Not on file    Relationship status: Not on file  . Intimate partner violence:    Fear of current or ex partner: Not on file    Emotionally abused: Not on file    Physically abused: Not on file    Forced sexual activity: Not on file  Other Topics Concern  . Not on file  Social History Narrative  . Not on file    Family History  Problem Relation Age of Onset  . Breast cancer Sister 24  . Cancer Mother     Allergies  Allergen Reactions    . Pneumovax [Pneumococcal Polysaccharide Vaccine]      Review of Systems   Review of Systems: Negative Unless Checked Constitutional: [] Weight loss  [] Fever  [] Chills Cardiac: [] Chest pain   []  Atrial Fibrillation  [] Palpitations   [] Shortness of breath when laying flat   [] Shortness of breath with exertion. [] Shortness of breath at rest Vascular:  [] Pain in legs with walking   [] Pain in legs with standing [] Pain in legs when laying flat   [] Claudication    [] Pain in feet when laying flat    [] History of DVT   [] Phlebitis   [x] Swelling in legs   [] Varicose veins   [] Non-healing ulcers Pulmonary:   [] Uses home oxygen   [] Productive cough   [] Hemoptysis   [] Wheeze  [] COPD   [] Asthma Neurologic:  [] Dizziness   [] Seizures  [] Blackouts [] History of stroke   [] History of TIA  [] Aphasia   [] Temporary Blindness   [] Weakness or numbness in arm   [x] Weakness or numbness in leg Musculoskeletal:   [] Joint swelling   [] Joint pain   [] Low back pain  []  History of Knee Replacement [x] Arthritis [] back Surgeries  []  Spinal Stenosis    Hematologic:  [] Easy bruising  [] Easy bleeding   [] Hypercoagulable state   [] Anemic Gastrointestinal:  [] Diarrhea   [] Vomiting  [] Gastroesophageal reflux/heartburn   [] Difficulty swallowing. [] Abdominal pain Genitourinary:  [] Chronic kidney disease   [] Difficult urination  [] Anuric   [] Blood in urine [] Frequent urination  [] Burning with urination   [] Hematuria Skin:  [] Rashes   [] Ulcers [] Wounds Psychological:  [] History of anxiety   []  History of major depression  []  Memory Difficulties      OBJECTIVE:   Physical Exam  BP (!) 152/73   Pulse 82   Resp 18   Ht 5\' 4"  (1.626 m)   Wt 195 lb (88.5 kg)   BMI 33.47 kg/m   Gen: WD/WN, NAD Head: [place]/AT, No temporalis wasting.  Ear/Nose/Throat: Hearing grossly intact, nares w/o erythema or drainage Eyes: PER, EOMI, sclera nonicteric.  Neck: Supple, no masses.  No JVD.  Pulmonary:  Good air movement, no use of accessory  muscles.  Cardiac: RRR Vascular:  2+ edema left lower extremity Vessel Right Left  Radial Palpable Palpable  Dorsalis Pedis Palpable Palpable  Posterior Tibial Palpable Palpable   Gastrointestinal: soft, non-distended. No guarding/no peritoneal signs.  Musculoskeletal: M/S 5/5 throughout.  No deformity or atrophy.  Neurologic: Pain and light touch intact in extremities.  Symmetrical.  Speech is fluent. Motor exam as listed above. Psychiatric: Judgment intact, Mood & affect appropriate for pt's clinical situation. Dermatologic:  Stasis dermatitis on left lower extremity.  No changes consistent with cellulitis. Lymph : No Cervical lymphadenopathy, dermal thickening bilaterally      ASSESSMENT AND PLAN:  1. Lower limb ulcer, calf, left, with fat layer exposed ([place]) Ulcer has completely resolved however there is still some very persistent swelling in that left calf.  Normally that with continued medical grade 1 compression therapy as well and plan therapy this will help to control swelling in the left lower extremity.    It is also likely that the traumatic fall caused some scarring of the lymphatic channels causing difficulties with drainage in that left lower extremity. I think a lymph pump will greatly help the patient with control of the swelling.   2. Essential hypertension Continue antihypertensive medications as already ordered, these medications have been reviewed and there are no changes at this time.   3. Lymphedema Recommend:  No surgery or intervention at this point in time.    I have reviewed my previous discussion with the patient regarding swelling and why it causes symptoms.  Patient will continue wearing graduated compression stockings class 1 (20-30 mmHg) on a daily basis. The patient will  beginning wearing the stockings first thing in the morning and removing them in the evening. The patient is instructed specifically not to sleep in the stockings.    In addition,  behavioral modification including several periods of elevation of the lower extremities during the day will be continued.  This was reviewed with the patient during the initial visit.  The patient will also continue routine exercise, especially walking on a daily basis as was discussed during the initial visit.    Despite conservative treatments including graduated compression therapy class 1 and behavioral modification including exercise and elevation the patient  has not obtained adequate control of the lymphedema.  The patient still has stage 3 lymphedema and therefore, I believe that a lymph pump should be added to improve the control of the patient's lymphedema.  Additionally, a lymph pump is warranted because it will reduce the risk of cellulitis and ulceration in the future.  Patient should follow-up in six months     Current Outpatient Medications on File Prior to Visit  Medication Sig Dispense Refill  . acetaminophen (TYLENOL) 325 MG tablet Take 650 mg by mouth every 6 (six) hours as needed.    Jearl Klinefelter ELLIPTA 62.5-25 MCG/INH AEPB INHALE 1 PUFF INTO THE LUNGS ONCE DAILY  4  . aspirin EC 81 MG tablet Take 81 mg by mouth daily.    . Cholecalciferol 1000 units tablet Take 1,000 Units by mouth daily.    . citalopram (CELEXA) 20 MG tablet Take 20 mg by mouth daily.    Marland Kitchen gabapentin (NEURONTIN) 400 MG capsule Take 400 mg by mouth 2 (two) times daily.    Marland Kitchen lisinopril (PRINIVIL,ZESTRIL) 40 MG tablet Take 40 mg by mouth daily.    . metoprolol succinate (TOPROL-XL) 25 MG 24 hr tablet Take 12.5 mg by mouth daily.    . Multiple Vitamin (MULTIVITAMIN) tablet Take 1 tablet by mouth daily.    . simvastatin (ZOCOR) 20 MG tablet Take 20 mg by mouth daily.    . VENTOLIN HFA 108 (90 Base) MCG/ACT inhaler INHALE 2 PUFFS BY MOUTH EVERY 6 HOURS AS NEEDED FOR WHEEZE  12  . warfarin (COUMADIN) 1 MG tablet Take 1 mg by  mouth daily.     Marland Kitchen warfarin (COUMADIN) 5 MG tablet Take 5 mg by mouth daily.      No  current facility-administered medications on file prior to visit.     There are no Patient Instructions on file for this visit. No follow-ups on file.   Kris Hartmann, NP  This note was completed with Sales executive.  Any errors are purely unintentional.

## 2019-04-09 ENCOUNTER — Other Ambulatory Visit: Payer: Self-pay | Admitting: Sports Medicine

## 2019-04-09 DIAGNOSIS — M23301 Other meniscus derangements, unspecified lateral meniscus, left knee: Secondary | ICD-10-CM

## 2019-04-09 DIAGNOSIS — M25562 Pain in left knee: Secondary | ICD-10-CM

## 2019-04-09 DIAGNOSIS — M25462 Effusion, left knee: Secondary | ICD-10-CM

## 2019-04-09 DIAGNOSIS — M7122 Synovial cyst of popliteal space [Baker], left knee: Secondary | ICD-10-CM

## 2019-04-09 DIAGNOSIS — G8929 Other chronic pain: Secondary | ICD-10-CM

## 2019-04-12 ENCOUNTER — Ambulatory Visit (INDEPENDENT_AMBULATORY_CARE_PROVIDER_SITE_OTHER): Payer: Medicare Other | Admitting: Vascular Surgery

## 2019-04-16 ENCOUNTER — Ambulatory Visit (INDEPENDENT_AMBULATORY_CARE_PROVIDER_SITE_OTHER): Payer: Medicare Other | Admitting: Vascular Surgery

## 2019-04-18 ENCOUNTER — Other Ambulatory Visit: Payer: Self-pay

## 2019-04-18 ENCOUNTER — Ambulatory Visit
Admission: RE | Admit: 2019-04-18 | Discharge: 2019-04-18 | Disposition: A | Discharge status: Home / Self Care | Payer: Medicare Other | Source: Ambulatory Visit | Attending: Sports Medicine | Admitting: Sports Medicine

## 2019-04-18 DIAGNOSIS — M7122 Synovial cyst of popliteal space [Baker], left knee: Secondary | ICD-10-CM | POA: Diagnosis present

## 2019-04-18 DIAGNOSIS — M25462 Effusion, left knee: Secondary | ICD-10-CM | POA: Insufficient documentation

## 2019-04-18 DIAGNOSIS — M25562 Pain in left knee: Secondary | ICD-10-CM | POA: Diagnosis present

## 2019-04-18 DIAGNOSIS — M23301 Other meniscus derangements, unspecified lateral meniscus, left knee: Secondary | ICD-10-CM | POA: Diagnosis present

## 2019-04-18 DIAGNOSIS — G8929 Other chronic pain: Secondary | ICD-10-CM

## 2019-04-23 ENCOUNTER — Encounter (INDEPENDENT_AMBULATORY_CARE_PROVIDER_SITE_OTHER): Payer: Self-pay | Admitting: Vascular Surgery

## 2019-04-23 ENCOUNTER — Other Ambulatory Visit: Payer: Self-pay

## 2019-04-23 ENCOUNTER — Ambulatory Visit (INDEPENDENT_AMBULATORY_CARE_PROVIDER_SITE_OTHER): Payer: Medicare Other | Admitting: Vascular Surgery

## 2019-04-23 VITALS — BP 172/82 | HR 96 | Resp 16 | Wt 194.0 lb

## 2019-04-23 DIAGNOSIS — I2510 Atherosclerotic heart disease of native coronary artery without angina pectoris: Secondary | ICD-10-CM

## 2019-04-23 DIAGNOSIS — I10 Essential (primary) hypertension: Secondary | ICD-10-CM | POA: Diagnosis not present

## 2019-04-23 DIAGNOSIS — I890 Lymphedema, not elsewhere classified: Secondary | ICD-10-CM | POA: Diagnosis not present

## 2019-04-23 DIAGNOSIS — M79609 Pain in unspecified limb: Secondary | ICD-10-CM | POA: Insufficient documentation

## 2019-04-23 DIAGNOSIS — M79605 Pain in left leg: Secondary | ICD-10-CM

## 2019-04-23 DIAGNOSIS — R609 Edema, unspecified: Secondary | ICD-10-CM | POA: Diagnosis not present

## 2019-04-23 NOTE — Assessment & Plan Note (Signed)
I have looked at the MRI report with the patient and she appears to have a complex meniscus tear on the left.  She has seen orthopedics and hopefully they will be contacting her in the next few days to discuss the results and determine the best treatment options going forward.

## 2019-04-23 NOTE — Progress Notes (Signed)
MRN : [id number]  Carol Riley is a 78 y.o. (Sep 04, 1940) female who presents with chief complaint of  Chief Complaint  Patient presents with   Follow-up    61month follow up  .  History of Present Illness: Patient returns today in follow up of of her leg pain and swelling.  Her legs were doing quite well until a few weeks ago when she hurt her left knee and since that time the left leg has become much more swollen.  It is bruised and discolored.  The swelling is quite prominent.  The knee itself is the most painful part.  She apparently has had a knee MRI which shows a meniscus tear but she has not yet heard back from her orthopedic surgeon.  No right leg symptoms currently  Current Outpatient Medications  Medication Sig Dispense Refill   acetaminophen (TYLENOL) 325 MG tablet Take 650 mg by mouth every 6 (six) hours as needed.     ANORO ELLIPTA 62.5-25 MCG/INH AEPB INHALE 1 PUFF INTO THE LUNGS ONCE DAILY  4   aspirin EC 81 MG tablet Take 81 mg by mouth daily.     Cholecalciferol 1000 units tablet Take 1,000 Units by mouth daily.     citalopram (CELEXA) 20 MG tablet Take 20 mg by mouth daily.     gabapentin (NEURONTIN) 400 MG capsule Take 400 mg by mouth 2 (two) times daily.     lisinopril (PRINIVIL,ZESTRIL) 40 MG tablet Take 20 mg by mouth daily.      metoprolol succinate (TOPROL-XL) 25 MG 24 hr tablet Take 12.5 mg by mouth daily.     Multiple Vitamin (MULTIVITAMIN) tablet Take 1 tablet by mouth daily.     simvastatin (ZOCOR) 20 MG tablet Take 20 mg by mouth daily.     VENTOLIN HFA 108 (90 Base) MCG/ACT inhaler INHALE 2 PUFFS BY MOUTH EVERY 6 HOURS AS NEEDED FOR WHEEZE  12   warfarin (COUMADIN) 1 MG tablet Take 1 mg by mouth daily.      warfarin (COUMADIN) 5 MG tablet Take 5 mg by mouth daily.      No current facility-administered medications for this visit.     Past Medical History:  Diagnosis Date   Arthritis    Cancer ([place])    cervical cancer   Cataract  cortical, senile    COPD (chronic obstructive pulmonary disease) (HCC)    Coronary artery disease    Depression    DVT (deep venous thrombosis) (HCC)    Edema    FEET/LEGS   Hyperglycemia    Hypertension    Leukopenia    Neuropathy    Shortness of breath dyspnea    Sleep apnea    Thrombocytopenia (HCC)     Past Surgical History:  Procedure Laterality Date   BACK SURGERY     BREAST BIOPSY Left    benign needle   COLONOSCOPY     COLONOSCOPY WITH PROPOFOL N/A 02/24/2017   Procedure: COLONOSCOPY WITH PROPOFOL;  Surgeon: Lollie Sails, MD;  Location: [facility] ENDOSCOPY;  Service: Endoscopy;  Laterality: N/A;   CORONARY ANGIOPLASTY     STENT   EYE SURGERY     GAS INSERTION Right 05/06/2015   Procedure: INSERTION OF GAS;  Surgeon: Milus Height, MD;  Location: [facility] ORS;  Service: Ophthalmology;  Laterality: Right;   MEMBRANE PEEL Right 05/06/2015   Procedure: MEMBRANE PEEL;  Surgeon: Milus Height, MD;  Location: [facility] ORS;  Service: Ophthalmology;  Laterality: Right;  PARS PLANA VITRECTOMY Right 05/06/2015   Procedure: PARS PLANA VITRECTOMY WITH 23 GAUGE;  Surgeon: Milus Height, MD;  Location: [facility] ORS;  Service: Ophthalmology;  Laterality: Right;    Social History Social History   Tobacco Use   Smoking status: Former Smoker   Smokeless tobacco: Never Used  Substance Use Topics   Alcohol use: No   Drug use: No    Family History Family History  Problem Relation Age of Onset   Breast cancer Sister 71   Cancer Mother     Allergies  Allergen Reactions   Pneumovax [Pneumococcal Polysaccharide Vaccine]    REVIEW OF SYSTEMS (Negative unless checked)  Constitutional: [] ?Weight loss  [] ?Fever  [] ?Chills Cardiac: [] ?Chest pain   [] ?Chest pressure   [x] ?Palpitations   [] ?Shortness of breath when laying flat   [] ?Shortness of breath at rest   [] ?Shortness of breath with exertion. Vascular:  [] ?Pain in legs with walking   [] ?Pain in legs at  rest   [] ?Pain in legs when laying flat   [] ?Claudication   [] ?Pain in feet when walking  [] ?Pain in feet at rest  [] ?Pain in feet when laying flat   [x] ?History of DVT   [] ?Phlebitis   [x] ?Swelling in legs   [] ?Varicose veins   [x] ?Non-healing ulcers Pulmonary:   [] ?Uses home oxygen   [] ?Productive cough   [] ?Hemoptysis   [] ?Wheeze  [x] ?COPD   [] ?Asthma Neurologic:  [] ?Dizziness  [] ?Blackouts   [] ?Seizures   [] ?History of stroke   [] ?History of TIA  [] ?Aphasia   [] ?Temporary blindness   [] ?Dysphagia   [] ?Weakness or numbness in arms   [] ?Weakness or numbness in legs Musculoskeletal:  [x] ?Arthritis   [] ?Joint swelling   [] ?Joint pain   [] ?Low back pain Hematologic:  [] ?Easy bruising  [] ?Easy bleeding   [] ?Hypercoagulable state   [] ?Anemic  [] ?Hepatitis Gastrointestinal:  [] ?Blood in stool   [] ?Vomiting blood  [] ?Gastroesophageal reflux/heartburn   [] ?Abdominal pain Genitourinary:  [] ?Chronic kidney disease   [] ?Difficult urination  [] ?Frequent urination  [] ?Burning with urination   [] ?Hematuria Skin:  [] ?Rashes   [x] ?Ulcers   [x] ?Wounds Psychological:  [] ?History of anxiety   [] ? History of major depression.   Physical Examination  BP (!) 172/82 (BP Location: Left Arm)    Pulse 96    Resp 16    Wt 194 lb (88 kg)    BMI 33.30 kg/m  Gen:  WD/WN, NAD.  Appears younger than stated age Head: [place]/AT, No temporalis wasting. Ear/Nose/Throat: Hearing grossly intact, nares w/o erythema or drainage Eyes: Conjunctiva clear. Sclera non-icteric Neck: Supple.  Trachea midline Pulmonary:  Good air movement, no use of accessory muscles.  Cardiac: RRR, no JVD Vascular:  Vessel Right Left  Radial Palpable Palpable                          PT Palpable  not palpable  DP  1+ palpable  1+ palpable   Gastrointestinal: soft, non-tender/non-distended.  Musculoskeletal: M/S 5/5 throughout.  No deformity or atrophy.  Significant bruising and mild erythema of the left lower leg.  No right lower extremity edema,  2-3+ left lower extremity edema. Neurologic: Sensation grossly intact in extremities.  Symmetrical.  Speech is fluent.  Psychiatric: Judgment intact, Mood & affect appropriate for pt's clinical situation. Dermatologic: No rashes or ulcers noted.  No cellulitis or open wounds.       Labs No results found for this or any previous visit (from the past 2160 hour(s)).  Radiology Mr  Knee Left Wo Contrast  Result Date: 04/19/2019 CLINICAL DATA:  Left knee pain after fall EXAM: MRI OF THE LEFT KNEE WITHOUT CONTRAST TECHNIQUE: Multiplanar, multisequence MR imaging of the knee was performed. No intravenous contrast was administered. COMPARISON:  X-ray 06/18/2018.  MRI 05/21/2009 FINDINGS: MENISCI Medial meniscus: Complex tear of the posterior horn and body segments of the medial meniscus including a prominent radial and horizontal oblique components. There is displaced meniscal tissue within the inferior gutter (series 9, image 14). Lateral meniscus:  Intact. LIGAMENTS Cruciates:  Intact ACL and PCL. Collaterals: Medial collateral ligament is intact. Lateral collateral ligament complex is intact. CARTILAGE Patellofemoral: Mild patellar surface irregularity including a partial-thickness fissure within the lateral patellar facet (series 12, image 26). No focal trochlear defect identified. Medial: Small focal near full-thickness cartilage defect involving the weight-bearing medial femoral condyle (series 9, image 17). Lateral:  No chondral defect. Joint:  Moderate joint effusion.  Normal Hoffa's fat. Popliteal Fossa:  Trace Baker's cyst.  Intact popliteus tendon. Extensor Mechanism:  Intact quadriceps tendon and patellar tendon. Bones: No focal marrow signal abnormality. No fracture or dislocation. Other: Mild nonspecific subcutaneous edema. IMPRESSION: 1. Extensive complex tear involving the posterior horn and body segments of the medial meniscus including a fragment of meniscal tissue displaced into the  inferior gutter. 2. Medial and patellofemoral compartment chondral abnormalities, as described above. 3. Moderate joint effusion and trace Baker's cyst. Electronically Signed   By: Davina Poke M.D.   On: 04/19/2019 13:32    Assessment/Plan Hypertension blood pressure control important in reducing the progression of atherosclerotic disease. On appropriate oral medications.   Coronary artery disease Continue cardiac and antihypertensive medications as already ordered and reviewed, no changes at this time.  Continue statin as ordered and reviewed, no changes at this time  Nitrates PRN for chest pain  Edema Symptoms were much better until she had this knee issue come up recently.  The swelling is a lot worse.  Elevating this and taking care of her knee problem is probably of the most benefit at this point.  I would consider wrapping her in [place] but given her knee issue that may not be the best option currently.  I will plan a follow-up in several months and will be happy to do Unna boots if needed at any point in the interim.  Pain in limb I have looked at the MRI report with the patient and she appears to have a complex meniscus tear on the left.  She has seen orthopedics and hopefully they will be contacting her in the next few days to discuss the results and determine the best treatment options going forward.    Leotis Pain, MD  04/23/2019 1:58 PM    This note was created with Dragon medical transcription system.  Any errors from dictation are purely unintentional

## 2019-04-23 NOTE — Assessment & Plan Note (Signed)
Symptoms were much better until she had this knee issue come up recently.  The swelling is a lot worse.  Elevating this and taking care of her knee problem is probably of the most benefit at this point.  I would consider wrapping her in [place] but given her knee issue that may not be the best option currently.  I will plan a follow-up in several months and will be happy to do Unna boots if needed at any point in the interim.

## 2019-05-10 ENCOUNTER — Encounter
Admission: RE | Admit: 2019-05-10 | Discharge: 2019-05-10 | Disposition: A | Discharge status: Home / Self Care | Payer: Medicare Other | Source: Ambulatory Visit | Attending: Orthopedic Surgery | Admitting: Orthopedic Surgery

## 2019-05-10 ENCOUNTER — Other Ambulatory Visit: Payer: Self-pay

## 2019-05-10 DIAGNOSIS — Z955 Presence of coronary angioplasty implant and graft: Secondary | ICD-10-CM | POA: Diagnosis not present

## 2019-05-10 DIAGNOSIS — Z01810 Encounter for preprocedural cardiovascular examination: Secondary | ICD-10-CM | POA: Diagnosis not present

## 2019-05-10 DIAGNOSIS — S83282A Other tear of lateral meniscus, current injury, left knee, initial encounter: Secondary | ICD-10-CM | POA: Diagnosis not present

## 2019-05-10 DIAGNOSIS — I10 Essential (primary) hypertension: Secondary | ICD-10-CM | POA: Diagnosis not present

## 2019-05-10 DIAGNOSIS — I2510 Atherosclerotic heart disease of native coronary artery without angina pectoris: Secondary | ICD-10-CM | POA: Diagnosis not present

## 2019-05-10 DIAGNOSIS — Z86718 Personal history of other venous thrombosis and embolism: Secondary | ICD-10-CM | POA: Diagnosis not present

## 2019-05-10 DIAGNOSIS — Z20828 Contact with and (suspected) exposure to other viral communicable diseases: Secondary | ICD-10-CM | POA: Insufficient documentation

## 2019-05-10 DIAGNOSIS — F329 Major depressive disorder, single episode, unspecified: Secondary | ICD-10-CM | POA: Diagnosis not present

## 2019-05-10 DIAGNOSIS — Z7982 Long term (current) use of aspirin: Secondary | ICD-10-CM | POA: Diagnosis not present

## 2019-05-10 DIAGNOSIS — Z01812 Encounter for preprocedural laboratory examination: Secondary | ICD-10-CM | POA: Diagnosis not present

## 2019-05-10 DIAGNOSIS — X58XXXA Exposure to other specified factors, initial encounter: Secondary | ICD-10-CM | POA: Diagnosis not present

## 2019-05-10 DIAGNOSIS — Z79899 Other long term (current) drug therapy: Secondary | ICD-10-CM | POA: Diagnosis not present

## 2019-05-10 DIAGNOSIS — Z7901 Long term (current) use of anticoagulants: Secondary | ICD-10-CM | POA: Diagnosis not present

## 2019-05-10 DIAGNOSIS — G4730 Sleep apnea, unspecified: Secondary | ICD-10-CM | POA: Diagnosis not present

## 2019-05-10 DIAGNOSIS — Z87891 Personal history of nicotine dependence: Secondary | ICD-10-CM | POA: Diagnosis not present

## 2019-05-10 DIAGNOSIS — I249 Acute ischemic heart disease, unspecified: Secondary | ICD-10-CM | POA: Diagnosis not present

## 2019-05-10 DIAGNOSIS — J449 Chronic obstructive pulmonary disease, unspecified: Secondary | ICD-10-CM | POA: Diagnosis not present

## 2019-05-10 DIAGNOSIS — S83242A Other tear of medial meniscus, current injury, left knee, initial encounter: Secondary | ICD-10-CM | POA: Diagnosis present

## 2019-05-10 NOTE — Patient Instructions (Signed)
Your procedure is scheduled on: Monday 05/13/19 Report to [place]. To find out your arrival time please call [phone] between 1PM - 3PM on Friday 05/10/19.  Remember: Instructions that are not followed completely may result in serious medical risk, up to and including death, or upon the discretion of your surgeon and anesthesiologist your surgery may need to be rescheduled.     _X__ 1. Do not eat food after midnight the night before your procedure.                 No gum chewing or hard candies. You may drink clear liquids up to 2 hours                 before you are scheduled to arrive for your surgery- DO not drink clear                 liquids within 2 hours of the start of your surgery.                 Clear Liquids include:  water, apple juice without pulp, clear carbohydrate                 drink such as Clearfast or Gatorade, Black Coffee or Tea (Do not add                 anything to coffee or tea). Diabetics water only  __X__2.  On the morning of surgery brush your teeth with toothpaste and water, you                 may rinse your mouth with mouthwash if you wish.  Do not swallow any              toothpaste of mouthwash.     _X__ 3.  No Alcohol for 24 hours before or after surgery.   _X__ 4.  Do Not Smoke or use e-cigarettes For 24 Hours Prior to Your Surgery.                 Do not use any chewable tobacco products for at least 6 hours prior to                 surgery.  ____  5.  Bring all medications with you on the day of surgery if instructed.   __X__  6.  Notify your doctor if there is any change in your medical condition      (cold, fever, infections).     Do not wear jewelry, make-up, hairpins, clips or nail polish. Do not wear lotions, powders, or perfumes.  Do not shave 48 hours prior to surgery. Men may shave face and neck. Do not bring valuables to the hospital.    [facility] is not responsible for any  belongings or valuables.  Contacts, dentures/partials or body piercings may not be worn into surgery. Bring a case for your contacts, glasses or hearing aids, a denture cup will be supplied. Leave your suitcase in the car. After surgery it may be brought to your room. For patients admitted to the hospital, discharge time is determined by your treatment team.   Patients discharged the day of surgery will not be allowed to drive home.   Please read over the following fact sheets that you were given:   MRSA Information  __X__ Take these medicines the morning of surgery with A SIP OF WATER:  1. citalopram (CELEXA  2. gabapentin (NEURONTIN  3. metoprolol succinate (TOPROL-XL  4.  5.  6.  ____ Fleet Enema (as directed)   __X__ Use CHG Soap/SAGE wipes as directed  ____ Use inhalers on the day of surgery  ____ Stop metformin/Janumet/Farxiga 2 days prior to surgery    ____ Take 1/2 of usual insulin dose the night before surgery. No insulin the morning          of surgery.   __X__ Stop Blood Thinners Coumadin/Plavix/Xarelto/Pleta/Pradaxa/Eliquis/Effient/  on   Or contact your Surgeon, Cardiologist or Medical Doctor regarding  ability to stop your blood thinners  __X__ Stop Anti-inflammatories 7 days before surgery such as Advil, Ibuprofen, Motrin,  BC or Goodies Powder, Naprosyn, Naproxen, Aleve   __X__ Stop all herbal supplements, fish oil or vitamin E until after surgery.  VITAMIN OK TO CONTINUE  __X__ Bring C-Pap to the hospital.

## 2019-05-11 LAB — SARS CORONAVIRUS 2 (TAT 6-24 HRS): SARS Coronavirus 2: NEGATIVE

## 2019-05-13 ENCOUNTER — Encounter
Admission: RE | Disposition: A | Discharge status: Home / Self Care | Payer: Self-pay | Source: Home / Self Care | Attending: Orthopedic Surgery

## 2019-05-13 ENCOUNTER — Ambulatory Visit: Payer: Medicare Other | Admitting: Certified Registered Nurse Anesthetist

## 2019-05-13 ENCOUNTER — Encounter: Payer: Self-pay | Admitting: *Deleted

## 2019-05-13 ENCOUNTER — Ambulatory Visit
Admission: RE | Admit: 2019-05-13 | Discharge: 2019-05-13 | Disposition: A | Discharge status: Home / Self Care | Payer: Medicare Other | Attending: Orthopedic Surgery | Admitting: Orthopedic Surgery

## 2019-05-13 DIAGNOSIS — Z7982 Long term (current) use of aspirin: Secondary | ICD-10-CM | POA: Insufficient documentation

## 2019-05-13 DIAGNOSIS — Z87891 Personal history of nicotine dependence: Secondary | ICD-10-CM | POA: Insufficient documentation

## 2019-05-13 DIAGNOSIS — S83242A Other tear of medial meniscus, current injury, left knee, initial encounter: Secondary | ICD-10-CM | POA: Diagnosis not present

## 2019-05-13 DIAGNOSIS — Z01810 Encounter for preprocedural cardiovascular examination: Secondary | ICD-10-CM | POA: Insufficient documentation

## 2019-05-13 DIAGNOSIS — X58XXXA Exposure to other specified factors, initial encounter: Secondary | ICD-10-CM | POA: Insufficient documentation

## 2019-05-13 DIAGNOSIS — F329 Major depressive disorder, single episode, unspecified: Secondary | ICD-10-CM | POA: Insufficient documentation

## 2019-05-13 DIAGNOSIS — Z955 Presence of coronary angioplasty implant and graft: Secondary | ICD-10-CM | POA: Insufficient documentation

## 2019-05-13 DIAGNOSIS — S83282A Other tear of lateral meniscus, current injury, left knee, initial encounter: Secondary | ICD-10-CM | POA: Insufficient documentation

## 2019-05-13 DIAGNOSIS — I10 Essential (primary) hypertension: Secondary | ICD-10-CM | POA: Insufficient documentation

## 2019-05-13 DIAGNOSIS — G4730 Sleep apnea, unspecified: Secondary | ICD-10-CM | POA: Insufficient documentation

## 2019-05-13 DIAGNOSIS — Z7901 Long term (current) use of anticoagulants: Secondary | ICD-10-CM | POA: Insufficient documentation

## 2019-05-13 DIAGNOSIS — I2510 Atherosclerotic heart disease of native coronary artery without angina pectoris: Secondary | ICD-10-CM | POA: Insufficient documentation

## 2019-05-13 DIAGNOSIS — Z86718 Personal history of other venous thrombosis and embolism: Secondary | ICD-10-CM | POA: Insufficient documentation

## 2019-05-13 DIAGNOSIS — I249 Acute ischemic heart disease, unspecified: Secondary | ICD-10-CM | POA: Insufficient documentation

## 2019-05-13 DIAGNOSIS — J449 Chronic obstructive pulmonary disease, unspecified: Secondary | ICD-10-CM | POA: Insufficient documentation

## 2019-05-13 DIAGNOSIS — Z79899 Other long term (current) drug therapy: Secondary | ICD-10-CM | POA: Insufficient documentation

## 2019-05-13 HISTORY — PX: KNEE ARTHROSCOPY WITH MEDIAL MENISECTOMY: SHX5651

## 2019-05-13 HISTORY — PX: CHONDROPLASTY: SHX5177

## 2019-05-13 LAB — PROTIME-INR
INR: 1 (ref 0.8–1.2)
Prothrombin Time: 13.3 seconds (ref 11.4–15.2)

## 2019-05-13 SURGERY — ARTHROSCOPY, KNEE, WITH MEDIAL MENISCECTOMY
Anesthesia: General | Site: Knee | Laterality: Left

## 2019-05-13 MED ORDER — FENTANYL CITRATE (PF) 100 MCG/2ML IJ SOLN: 25.0000 ug | INTRAMUSCULAR | Status: DC | PRN

## 2019-05-13 MED ORDER — LACTATED RINGERS IV SOLN
INTRAVENOUS | Status: DC
  Administered 2019-05-13: 10:00:00 via INTRAVENOUS

## 2019-05-13 MED ORDER — FENTANYL CITRATE (PF) 100 MCG/2ML IJ SOLN
INTRAMUSCULAR | Status: DC | PRN
  Administered 2019-05-13 (×4): 25 ug via INTRAVENOUS
  Administered 2019-05-13: 50 ug via INTRAVENOUS

## 2019-05-13 MED ORDER — BUPIVACAINE HCL (PF) 0.5 % IJ SOLN
INTRAMUSCULAR | Status: DC | PRN
  Administered 2019-05-13: 8 mL

## 2019-05-13 MED ORDER — LIDOCAINE HCL (CARDIAC) PF 100 MG/5ML IV SOSY
PREFILLED_SYRINGE | INTRAVENOUS | Status: DC | PRN
  Administered 2019-05-13: 80 mg via INTRAVENOUS

## 2019-05-13 MED ORDER — FAMOTIDINE 20 MG PO TABS
ORAL_TABLET | ORAL | Status: AC
  Administered 2019-05-13: 20 mg via ORAL
  Filled 2019-05-13: qty 1

## 2019-05-13 MED ORDER — FENTANYL CITRATE (PF) 250 MCG/5ML IJ SOLN
INTRAMUSCULAR | Status: AC
  Filled 2019-05-13: qty 5

## 2019-05-13 MED ORDER — PROPOFOL 10 MG/ML IV BOLUS
INTRAVENOUS | Status: AC
  Filled 2019-05-13: qty 20

## 2019-05-13 MED ORDER — LIDOCAINE-EPINEPHRINE 1 %-1:100000 IJ SOLN
INTRAMUSCULAR | Status: DC | PRN
  Administered 2019-05-13: 8 mL

## 2019-05-13 MED ORDER — EPHEDRINE SULFATE 50 MG/ML IJ SOLN
INTRAMUSCULAR | Status: DC | PRN
  Administered 2019-05-13: 10 mg via INTRAVENOUS

## 2019-05-13 MED ORDER — ACETAMINOPHEN 10 MG/ML IV SOLN
INTRAVENOUS | Status: DC | PRN
  Administered 2019-05-13: 1000 mg via INTRAVENOUS

## 2019-05-13 MED ORDER — LIDOCAINE HCL (PF) 2 % IJ SOLN
INTRAMUSCULAR | Status: AC
  Filled 2019-05-13: qty 10

## 2019-05-13 MED ORDER — ONDANSETRON HCL 4 MG/2ML IJ SOLN
INTRAMUSCULAR | Status: AC
  Filled 2019-05-13: qty 2

## 2019-05-13 MED ORDER — CEFAZOLIN SODIUM-DEXTROSE 2-4 GM/100ML-% IV SOLN
INTRAVENOUS | Status: AC
  Filled 2019-05-13: qty 100

## 2019-05-13 MED ORDER — HYDROCODONE-ACETAMINOPHEN 5-325 MG PO TABS
1.0000 | ORAL_TABLET | Freq: Once | ORAL | Status: AC
  Administered 2019-05-13: 15:00:00 1 via ORAL

## 2019-05-13 MED ORDER — CEFAZOLIN SODIUM-DEXTROSE 2-4 GM/100ML-% IV SOLN
2.0000 g | Freq: Once | INTRAVENOUS | Status: AC
  Administered 2019-05-13: 2 g via INTRAVENOUS

## 2019-05-13 MED ORDER — BUPIVACAINE HCL (PF) 0.5 % IJ SOLN
INTRAMUSCULAR | Status: AC
  Filled 2019-05-13: qty 30

## 2019-05-13 MED ORDER — LIDOCAINE-EPINEPHRINE 1 %-1:100000 IJ SOLN
INTRAMUSCULAR | Status: AC
  Filled 2019-05-13: qty 1

## 2019-05-13 MED ORDER — DEXAMETHASONE SODIUM PHOSPHATE 10 MG/ML IJ SOLN
INTRAMUSCULAR | Status: AC
  Filled 2019-05-13: qty 1

## 2019-05-13 MED ORDER — IPRATROPIUM-ALBUTEROL 0.5-2.5 (3) MG/3ML IN SOLN
3.0000 mL | Freq: Once | RESPIRATORY_TRACT | Status: AC
  Administered 2019-05-13: 14:00:00 3 mL via RESPIRATORY_TRACT

## 2019-05-13 MED ORDER — HYDROCODONE-ACETAMINOPHEN 5-325 MG PO TABS: 1.0000 | ORAL_TABLET | ORAL | 0 refills | Status: DC | PRN

## 2019-05-13 MED ORDER — ONDANSETRON HCL 4 MG/2ML IJ SOLN: 4.0000 mg | Freq: Once | INTRAMUSCULAR | Status: DC | PRN

## 2019-05-13 MED ORDER — ACETAMINOPHEN 10 MG/ML IV SOLN
INTRAVENOUS | Status: AC
  Filled 2019-05-13: qty 100

## 2019-05-13 MED ORDER — ENOXAPARIN SODIUM 40 MG/0.4ML ~~LOC~~ SOLN: 40.0000 mg | SUBCUTANEOUS | 0 refills | Status: DC

## 2019-05-13 MED ORDER — EPINEPHRINE PF 1 MG/ML IJ SOLN
INTRAMUSCULAR | Status: AC
  Filled 2019-05-13: qty 4

## 2019-05-13 MED ORDER — IPRATROPIUM-ALBUTEROL 0.5-2.5 (3) MG/3ML IN SOLN
RESPIRATORY_TRACT | Status: AC
  Administered 2019-05-13: 3 mL via RESPIRATORY_TRACT
  Filled 2019-05-13: qty 3

## 2019-05-13 MED ORDER — DEXAMETHASONE SODIUM PHOSPHATE 10 MG/ML IJ SOLN
INTRAMUSCULAR | Status: DC | PRN
  Administered 2019-05-13: 10 mg via INTRAVENOUS

## 2019-05-13 MED ORDER — ONDANSETRON 4 MG PO TBDP: 4.0000 mg | ORAL_TABLET | Freq: Three times a day (TID) | ORAL | 0 refills | Status: DC | PRN

## 2019-05-13 MED ORDER — ONDANSETRON HCL 4 MG/2ML IJ SOLN
INTRAMUSCULAR | Status: DC | PRN
  Administered 2019-05-13: 4 mg via INTRAVENOUS

## 2019-05-13 MED ORDER — ACETAMINOPHEN 500 MG PO TABS: 1000.0000 mg | ORAL_TABLET | Freq: Three times a day (TID) | ORAL | 2 refills | Status: AC

## 2019-05-13 MED ORDER — HYDROCODONE-ACETAMINOPHEN 5-325 MG PO TABS
ORAL_TABLET | ORAL | Status: AC
  Filled 2019-05-13: qty 1

## 2019-05-13 MED ORDER — FAMOTIDINE 20 MG PO TABS
20.0000 mg | ORAL_TABLET | Freq: Once | ORAL | Status: AC
  Administered 2019-05-13: 09:00:00 20 mg via ORAL

## 2019-05-13 MED ORDER — PROPOFOL 10 MG/ML IV BOLUS
INTRAVENOUS | Status: DC | PRN
  Administered 2019-05-13: 120 mg via INTRAVENOUS

## 2019-05-13 SURGICAL SUPPLY — 59 items
"PENCIL ELECTRO HAND CTR " (MISCELLANEOUS) IMPLANT
ADAPTER IRRIG TUBE 2 SPIKE SOL (ADAPTER) ×6 IMPLANT
ADPR TBG 2 SPK PMP STRL ASCP (ADAPTER) ×2
APL PRP STRL LF DISP 70% ISPRP (MISCELLANEOUS) ×1
BLADE SURG SZ11 CARB STEEL (BLADE) ×3 IMPLANT
BNDG COHESIVE 6X5 TAN STRL LF (GAUZE/BANDAGES/DRESSINGS) ×3 IMPLANT
BNDG ELASTIC 6X5.8 VLCR STR LF (GAUZE/BANDAGES/DRESSINGS) ×3 IMPLANT
BNDG ESMARK 6X12 TAN STRL LF (GAUZE/BANDAGES/DRESSINGS) ×3 IMPLANT
BUR RADIUS 3.5 (BURR) IMPLANT
BUR RADIUS 4.0X18.5 (BURR) IMPLANT
CAST PADDING 6X4YD ST 30248 (SOFTGOODS) ×2
CHLORAPREP W/TINT 26 (MISCELLANEOUS) ×3 IMPLANT
CLOSURE WOUND 1/2 X4 (GAUZE/BANDAGES/DRESSINGS)
COOLER POLAR GLACIER W/PUMP (MISCELLANEOUS) ×3 IMPLANT
COVER WAND RF STERILE (DRAPES) ×3 IMPLANT
CUFF TOURN SGL QUICK 24 (TOURNIQUET CUFF)
CUFF TOURN SGL QUICK 30 (TOURNIQUET CUFF) ×3
CUFF TRNQT CYL 24X4X16.5-23 (TOURNIQUET CUFF) IMPLANT
CUFF TRNQT CYL 30X4X21-28X (TOURNIQUET CUFF) IMPLANT
DEVICE SUCT BLK HOLE OR FLOOR (MISCELLANEOUS) ×3 IMPLANT
DRAPE LEGGINS SURG 28X43 STRL (DRAPES) IMPLANT
DRAPE SPLIT 6X30 W/TAPE (DRAPES) ×3 IMPLANT
ELECT REM PT RETURN 9FT ADLT (ELECTROSURGICAL)
ELECTRODE REM PT RTRN 9FT ADLT (ELECTROSURGICAL) IMPLANT
GAUZE SPONGE 4X4 12PLY STRL (GAUZE/BANDAGES/DRESSINGS) ×3 IMPLANT
GLOVE BIOGEL PI IND STRL 8 (GLOVE) ×1 IMPLANT
GLOVE BIOGEL PI INDICATOR 8 (GLOVE) ×2
GLOVE SURG ORTHO 8.0 STRL STRW (GLOVE) ×6 IMPLANT
GOWN STRL REUS W/ TWL LRG LVL3 (GOWN DISPOSABLE) ×1 IMPLANT
GOWN STRL REUS W/ TWL XL LVL3 (GOWN DISPOSABLE) ×1 IMPLANT
GOWN STRL REUS W/TWL LRG LVL3 (GOWN DISPOSABLE) ×3
GOWN STRL REUS W/TWL XL LVL3 (GOWN DISPOSABLE) ×3
IV LACTATED RINGER IRRG 3000ML (IV SOLUTION) ×12
IV LR IRRIG 3000ML ARTHROMATIC (IV SOLUTION) ×4 IMPLANT
KIT TURNOVER KIT A (KITS) ×3 IMPLANT
MANIFOLD NEPTUNE II (INSTRUMENTS) ×3 IMPLANT
MAT ABSORB  FLUID 56X50 GRAY (MISCELLANEOUS) ×4
MAT ABSORB FLUID 56X50 GRAY (MISCELLANEOUS) ×2 IMPLANT
NDL MAYO CATGUT SZ5 (NEEDLE)
NDL SUT 5 .5 CRC TPR PNT MAYO (NEEDLE) IMPLANT
NEEDLE HYPO 22GX1.5 SAFETY (NEEDLE) ×3 IMPLANT
PACK ARTHROSCOPY KNEE (MISCELLANEOUS) ×3 IMPLANT
PAD ABD DERMACEA PRESS 5X9 (GAUZE/BANDAGES/DRESSINGS) ×2 IMPLANT
PAD WRAPON POLAR KNEE (MISCELLANEOUS) ×1 IMPLANT
PADDING CAST COTTON 6X4 ST (SOFTGOODS) ×1 IMPLANT
PENCIL ELECTRO HAND CTR (MISCELLANEOUS) IMPLANT
SET TUBE SUCT SHAVER OUTFL 24K (TUBING) ×3 IMPLANT
SET TUBE TIP INTRA-ARTICULAR (MISCELLANEOUS) ×3 IMPLANT
STRIP CLOSURE SKIN 1/2X4 (GAUZE/BANDAGES/DRESSINGS) IMPLANT
SUT ETHILON 3-0 FS-10 30 BLK (SUTURE) ×3
SUT MNCRL AB 4-0 PS2 18 (SUTURE) IMPLANT
SUT VIC AB 0 CT2 27 (SUTURE) IMPLANT
SUT VIC AB 2-0 CT2 27 (SUTURE) IMPLANT
SUTURE EHLN 3-0 FS-10 30 BLK (SUTURE) ×1 IMPLANT
TOWEL OR 17X26 4PK STRL BLUE (TOWEL DISPOSABLE) ×6 IMPLANT
TUBING ARTHRO INFLOW-ONLY STRL (TUBING) ×3 IMPLANT
WAND HAND CNTRL MULTIVAC 50 (MISCELLANEOUS) IMPLANT
WAND WEREWOLF FLOW 90D (MISCELLANEOUS) IMPLANT
WRAPON POLAR PAD KNEE (MISCELLANEOUS) ×3

## 2019-05-13 NOTE — Progress Notes (Signed)
Ch visited pt in pre-op. Pt shared that she was having surgery on her L knee. Pt shared that she has had several challenges with her L leg over the years but also concerned that her blood thinner meds may be contributing to her lack of balance and dizziness periodically. 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 Pt was pleasant even though she was concerned about her high BP while in pre-op. Ch and care team member provided assurance that she would be fine during the procedure. Ch provided prayer for pt.  No further needs at this time.    05/13/19 1000  Clinical Encounter Type  Visited With Patient  Visit Type Spiritual support;Psychological support;Social support;Pre-op  Spiritual Encounters  Spiritual Needs Emotional;Grief support;Prayer  Stress Factors  Patient Stress Factors Family relationships;Loss of control;Major life changes  Family Stress Factors Major life changes

## 2019-05-13 NOTE — Op Note (Signed)
Operative Note    SURGERY DATE: 05/13/2019   PRE-OP DIAGNOSIS:  1. Left medial meniscus tear 2. Left tricompartmental degenerative changes   POST-OP DIAGNOSIS:  1. Left medial and lateral meniscus tear 2. Left tricompartmental degenerative changes   PROCEDURES:  1.  Left knee arthroscopy, partial medial AND lateral meniscectomy 2.  Left knee chondroplasty of patellofemoral, medial, and lateral compartments   SURGEON: Cato Mulligan, MD   ANESTHESIA: Gen   ESTIMATED BLOOD LOSS: minimal   TOTAL IV FLUIDS: per anesthesia   INDICATION(S):  Carol Riley is a 78 y.o. female with signs and symptoms as well as MRI finding of medial meniscus tear.  She failed nonoperative management in the form of corticosteroid injection, activity modification, and medications.  After discussion of risks, benefits, and alternatives to surgery, the patient elected to proceed.   OPERATIVE FINDINGS:    Examination under anesthesia: A careful examination under anesthesia was performed.  Passive range of motion was: Hyperextension: 1.  Extension: 0.  Flexion: 120.  Lachman: normal. Pivot Shift: normal.  Posterior drawer: normal.  Varus stability in full extension: normal.  Varus stability in 30 degrees of flexion: normal.  Valgus stability in full extension: normal.  Valgus stability in 30 degrees of flexion: normal.   Intra-operative findings: A thorough arthroscopic examination of the knee was performed.  The findings are: 1. Suprapatellar pouch: Normal 2. Undersurface of median ridge: Grade 2-3 degenerative changes 3. Medial patellar facet: Grade 1-2 degenerative changes 4. Lateral patellar facet: Grade 3 degenerative changes 5. Trochlea: Grade 1 degenerative changes 6. Lateral gutter/popliteus tendon: Normal 7. Hoffa's fat pad: Inflamed 8. Medial gutter/plica: Normal 9. ACL: Normal 10. PCL: Normal 11. Medial meniscus: Complex tear with  parrot-beak tear of the undersurface of the posterior horn/body  junction with flipped fragment in the medial tibial recess and additional radial tear adjacent to the posterior horn meniscus root involving almost the complete width of the meniscus 12. Medial compartment cartilage: Focal area approximately 15 x 20 mm of grade 3-4 degenerative changes of the medial femoral condyle with diffuse grade 1-2 degenerative changes and grade 1-2 degenerative changes of the tibial plateau 13. Lateral meniscus: Partial, almost complete width tear of the posterior horn meniscus root as well as some mild undersurface fraying and tearing of the lateral meniscus body extending to the posterior and anterior horns 14. Lateral compartment cartilage:  Areas of grade 2-3 degenerative changes to the lateral femoral condyle and focal area of grade 3 degenerative changes centrally on the tibial plateau   OPERATIVE REPORT:     I identified Brittley E Manni in the pre-operative holding area. I marked the operative knee with my initials. I reviewed the risks and benefits of the proposed surgical intervention and the patient (and/or patient's guardian) wished to proceed. The patient was transferred to the operative suite and placed in the supine position with all bony prominences padded.  Anesthesia was administered. Appropriate IV antibiotics were administered prior to incision. The extremity was then prepped and draped in standard fashion. A time out was performed confirming the correct extremity, correct patient, and correct procedure.   Arthroscopy portals were marked. Local anesthetic was injected to the planned portal sites. The anterolateral portal was established with an 11 blade.    The arthroscope was placed in the anterolateral portal and then into the suprapatellar pouch.  A diagnostic knee scope was completed with the above findings. The medial and lateral meniscus tears were identified.   Next the medial  portal was established under needle localization.  Using a combination of an  oscillating shaver and arthroscopic biters, a lateral meniscus tear was debrided such that there were stable borders.  The meniscus was probed and there was no grossly increased motion/displacement.  Next, I turned my attention to the medial meniscus.  The MCL was pie-crusted to improve visualization of the posterior horn. The meniscal tears were debrided using an arthroscopic biter and an oscillating shaver until the meniscus had stable borders.  A chondroplasty was performed of the medial compartment, lateral compartment, and patellofemoral compartment such that there were stable cartilage edges without any loose fragments of cartilage. Arthroscopic fluid was removed from the joint.   The portals were closed with 3-0 Nylon suture. Sterile dressings included Xeroform, 4x4s, Sof-Rol, and Bias wrap. A Polarcare was placed.  The patient was then awakened and taken to the PACU hemodynamically stable without complication.     POSTOPERATIVE PLAN: The patient will be discharged home today once they meet PACU criteria.  Patient will resume home Coumadin on postoperative day #0 in the evening.  She will also begin taking Lovenox injections x5 days starting on postoperative day #1. Physical therapy will start on POD#3-4. Weight-bearing as tolerated. Follow up in 2 weeks per protocol.

## 2019-05-13 NOTE — Progress Notes (Signed)
Pt O2 sats in the 91-93% range on room air. Pt bilateral lungs noted to be congested on auscultation. Dr. Randa Lynn notified. Acknowledged. Orders received.

## 2019-05-13 NOTE — Transfer of Care (Signed)
Immediate Anesthesia Transfer of Care Note  Patient: Carol Riley  Procedure(s) Performed: KNEE ARTHROSCOPY WITH PARTIAL  MEDIAL and lateral MENISECTOMY (Left Knee)  Patient Location: PACU  Anesthesia Type:General  Level of Consciousness: drowsy  Airway & Oxygen Therapy: Patient Spontanous Breathing and Patient connected to face mask oxygen  Post-op Assessment: Report given to RN and Post -op Vital signs reviewed and stable  Post vital signs: Reviewed and stable  Last Vitals:  Vitals Value Taken Time  BP 159/81 05/13/19 1318  Temp 36.7 C 05/13/19 1318  Pulse 92 05/13/19 1320  Resp 12 05/13/19 1320  SpO2 98 % 05/13/19 1320  Vitals shown include unvalidated device data.  Last Pain:  Vitals:   05/13/19 1318  TempSrc:   PainSc: Asleep         Complications: No apparent anesthesia complications

## 2019-05-13 NOTE — H&P (Signed)
Paper H&P to be scanned into permanent record. H&P reviewed. No significant changes noted.  

## 2019-05-13 NOTE — Discharge Instructions (Signed)
Arthroscopic Knee Surgery - Partial Meniscectomy   Post-Op Instructions   1. Bracing or crutches: Crutches will be provided at the time of discharge from the surgery center if you do not already have them.   2. Ice: You may be provided with a device [facility]) that allows you to ice the affected area effectively. Otherwise you can ice manually.    3. Driving:  Plan on not driving for at least two weeks. Please note that you are advised NOT to drive while taking narcotic pain medications as you may be impaired and unsafe to drive.   4. Activity: Ankle pumps several times an hour while awake to prevent blood clots. Weight bearing: as tolerated. Use crutches for as needed (usually ~1 week or less) until pain allows you to ambulate without a limp. Bending and straightening the knee is unlimited. Elevate knee above heart level as much as possible for one week. Avoid standing more than 5 minutes (consecutively) for the first week.  Avoid long distance travel for 2 weeks.  5. Medications:  - You have been provided a prescription for narcotic pain medicine. After surgery, take 1-2 narcotic tablets every 4 hours if needed for severe pain.  - You may take up to 3000mg /day of tylenol (acetaminophen). You can take 1000mg  3x/day. Please check your narcotic. If you have acetaminophen in your narcotic (each tablet will be 325mg ), be careful not to exceed a total of 3000mg /day of acetaminophen.  - A prescription for anti-nausea medication will be provided in case the narcotic medicine or anesthesia causes nausea - take 1 tablet every 6 hours only if nauseated.  - Resume home Coumadin the day of surgery in the evening. Perform Lovenox injections x 5 days starting the day after surgery.   6. Bandages: The physical therapist should change the bandages at the first post-op appointment. If needed, the dressing supplies have been provided to you.   7. Physical Therapy: 1-2 times per week for 6 weeks. Therapy  typically starts on post operative Day 3 or 4. You have been provided an order for physical therapy. The therapist will provide home exercises.   8. Work: May return to full work usually around 2 weeks after 1st post-operative visit. May do light duty/desk job in approximately 1-2 weeks when off of narcotics, pain is well-controlled, and swelling has decreased. Labor intensive jobs may require 4-6 weeks to return.      9. Post-Op Appointments: Your first post-op appointment will be with Dr. Posey Pronto in approximately 2 weeks time.    If you find that they have not been scheduled please call the Orthopaedic Appointment front desk at [phone].  AMBULATORY SURGERY  DISCHARGE INSTRUCTIONS   1) The drugs that you were given will stay in your system until tomorrow so for the next 24 hours you should not:  A) Drive an automobile B) Make any legal decisions C) Drink any alcoholic beverage   2) You may resume regular meals tomorrow.  Today it is better to start with liquids and gradually work up to solid foods.  You may eat anything you prefer, but it is better to start with liquids, then soup and crackers, and gradually work up to solid foods.   3) Please notify your doctor immediately if you have any unusual bleeding, trouble breathing, redness and pain at the surgery site, drainage, fever, or pain not relieved by medication.    4) Additional Instructions:        Please contact your physician  with any problems or Same Day Surgery at [phone], Monday through Friday 6 am to 4 pm, or [place] at [facility] number at [phone].

## 2019-05-13 NOTE — Anesthesia Procedure Notes (Signed)
Procedure Name: LMA Insertion Date/Time: 05/13/2019 12:07 PM Performed by: Eben Burow, CRNA Pre-anesthesia Checklist: Patient identified, Emergency Drugs available, Suction available and Patient being monitored Patient Re-evaluated:Patient Re-evaluated prior to induction Oxygen Delivery Method: Circle system utilized Preoxygenation: Pre-oxygenation with 100% oxygen Induction Type: IV induction Ventilation: Mask ventilation without difficulty LMA: LMA inserted LMA Size: 4.0 Number of attempts: 1 Placement Confirmation: positive ETCO2 and breath sounds checked- equal and bilateral Tube secured with: Tape Dental Injury: Teeth and Oropharynx as per pre-operative assessment

## 2019-05-13 NOTE — Anesthesia Preprocedure Evaluation (Signed)
Anesthesia Evaluation  Patient identified by MRN, date of birth, ID band Patient awake    Reviewed: Allergy & Precautions, H&P , NPO status , Patient's Chart, lab work & pertinent test results  History of Anesthesia Complications Negative for: history of anesthetic complications  Airway Mallampati: III  TM Distance: >3 FB Neck ROM: limited    Dental  (+) Partial Upper, Poor Dentition, Missing, Caps, Dental Advidsory Given   Pulmonary shortness of breath and with exertion, sleep apnea , COPD, neg recent URI, former smoker,    Pulmonary exam normal        Cardiovascular Exercise Tolerance: Good hypertension, (-) angina+ CAD and + Cardiac Stents  (-) Past MI and (-) CABG Normal cardiovascular exam(-) dysrhythmias (-) Valvular Problems/Murmurs     Neuro/Psych PSYCHIATRIC DISORDERS Depression negative neurological ROS     GI/Hepatic negative GI ROS, Neg liver ROS,   Endo/Other  negative endocrine ROS  Renal/GU negative Renal ROS  negative genitourinary   Musculoskeletal  (+) Arthritis ,   Abdominal   Peds  Hematology negative hematology ROS (+)   Anesthesia Other Findings Past Medical History:   Sleep apnea                                                  COPD (chronic obstructive pulmonary disease)                 Shortness of breath dyspnea                                  Coronary artery disease                                      Hypertension                                                 Neuropathy                                                   Arthritis                                                    Cancer                                                         Comment:cervical cancer   Depression  DVT (deep venous thrombosis)                                 Edema                                                          Comment:FEET/LEGS   Reproductive/Obstetrics negative OB ROS                             Anesthesia Physical  Anesthesia Plan  ASA: III  Anesthesia Plan: General   Post-op Pain Management:    Induction: Intravenous  PONV Risk Score and Plan: 3 and Ondansetron, Dexamethasone and Treatment may vary due to age or medical condition  Airway Management Planned: LMA  Additional Equipment:   Intra-op Plan:   Post-operative Plan: Extubation in OR  Informed Consent: I have reviewed the patients History and Physical, chart, labs and discussed the procedure including the risks, benefits and alternatives for the proposed anesthesia with the patient or authorized representative who has indicated his/her understanding and acceptance.     Dental Advisory Given  Plan Discussed with: Anesthesiologist, CRNA and Surgeon  Anesthesia Plan Comments:         Anesthesia Quick Evaluation

## 2019-05-13 NOTE — Anesthesia Post-op Follow-up Note (Signed)
Anesthesia QCDR form completed.        

## 2019-05-14 NOTE — Anesthesia Postprocedure Evaluation (Signed)
Anesthesia Post Note  Patient: Carol Riley  Procedure(s) Performed: KNEE ARTHROSCOPY WITH PARTIAL  MEDIAL and lateral MENISECTOMY (Left Knee) CHONDROPLASTY (Left Knee)  Patient location during evaluation: PACU Anesthesia Type: General Level of consciousness: awake and alert Pain management: pain level controlled Vital Signs Assessment: post-procedure vital signs reviewed and stable Respiratory status: spontaneous breathing, nonlabored ventilation, respiratory function stable and patient connected to nasal cannula oxygen Cardiovascular status: blood pressure returned to baseline and stable Postop Assessment: no apparent nausea or vomiting Anesthetic complications: no     Last Vitals:  Vitals:   05/13/19 1459 05/13/19 1541  BP: (!) 182/76 (!) 154/70  Pulse: 97 95  Resp: 18 18  Temp: (!) 36.3 C   SpO2: 94% 93%    Last Pain:  Vitals:   05/14/19 0921  TempSrc:   PainSc: 4                  Martha Clan

## 2020-12-21 ENCOUNTER — Other Ambulatory Visit (HOSPITAL_COMMUNITY): Payer: Self-pay | Admitting: Family Medicine

## 2020-12-21 ENCOUNTER — Other Ambulatory Visit: Payer: Self-pay | Admitting: Family Medicine

## 2020-12-21 DIAGNOSIS — M5416 Radiculopathy, lumbar region: Secondary | ICD-10-CM

## 2020-12-24 ENCOUNTER — Encounter (HOSPITAL_COMMUNITY): Payer: Self-pay

## 2020-12-24 ENCOUNTER — Ambulatory Visit (HOSPITAL_COMMUNITY): Payer: Medicare Other

## 2020-12-25 ENCOUNTER — Other Ambulatory Visit: Payer: Self-pay

## 2020-12-25 ENCOUNTER — Ambulatory Visit
Admission: RE | Admit: 2020-12-25 | Discharge: 2020-12-25 | Disposition: A | Discharge status: Home / Self Care | Payer: Medicare Other | Source: Ambulatory Visit | Attending: Family Medicine | Admitting: Family Medicine

## 2020-12-25 DIAGNOSIS — M5416 Radiculopathy, lumbar region: Secondary | ICD-10-CM | POA: Diagnosis not present

## 2021-07-02 DIAGNOSIS — I739 Peripheral vascular disease, unspecified: Secondary | ICD-10-CM | POA: Diagnosis not present

## 2021-07-02 DIAGNOSIS — I25118 Atherosclerotic heart disease of native coronary artery with other forms of angina pectoris: Secondary | ICD-10-CM | POA: Diagnosis not present

## 2021-07-02 DIAGNOSIS — R011 Cardiac murmur, unspecified: Secondary | ICD-10-CM | POA: Diagnosis not present

## 2021-07-02 DIAGNOSIS — F3342 Major depressive disorder, recurrent, in full remission: Secondary | ICD-10-CM | POA: Diagnosis not present

## 2021-07-02 DIAGNOSIS — I10 Essential (primary) hypertension: Secondary | ICD-10-CM | POA: Diagnosis not present

## 2021-07-02 DIAGNOSIS — D582 Other hemoglobinopathies: Secondary | ICD-10-CM | POA: Diagnosis not present

## 2021-07-02 DIAGNOSIS — D692 Other nonthrombocytopenic purpura: Secondary | ICD-10-CM | POA: Diagnosis not present

## 2021-07-02 DIAGNOSIS — G4733 Obstructive sleep apnea (adult) (pediatric): Secondary | ICD-10-CM | POA: Diagnosis not present

## 2021-07-02 DIAGNOSIS — D696 Thrombocytopenia, unspecified: Secondary | ICD-10-CM | POA: Diagnosis not present

## 2021-07-02 DIAGNOSIS — D68311 Acquired hemophilia: Secondary | ICD-10-CM | POA: Diagnosis not present

## 2021-07-02 DIAGNOSIS — M5136 Other intervertebral disc degeneration, lumbar region: Secondary | ICD-10-CM | POA: Diagnosis not present

## 2021-07-02 DIAGNOSIS — Z0000 Encounter for general adult medical examination without abnormal findings: Secondary | ICD-10-CM | POA: Diagnosis not present

## 2021-07-02 DIAGNOSIS — J432 Centrilobular emphysema: Secondary | ICD-10-CM | POA: Diagnosis not present

## 2021-07-18 DIAGNOSIS — J432 Centrilobular emphysema: Secondary | ICD-10-CM | POA: Diagnosis not present

## 2021-08-17 DIAGNOSIS — J432 Centrilobular emphysema: Secondary | ICD-10-CM | POA: Diagnosis not present

## 2021-08-18 DIAGNOSIS — R011 Cardiac murmur, unspecified: Secondary | ICD-10-CM | POA: Diagnosis not present

## 2021-09-09 DIAGNOSIS — R059 Cough, unspecified: Secondary | ICD-10-CM | POA: Diagnosis not present

## 2021-09-17 DIAGNOSIS — J432 Centrilobular emphysema: Secondary | ICD-10-CM | POA: Diagnosis not present

## 2021-09-21 DIAGNOSIS — Z7901 Long term (current) use of anticoagulants: Secondary | ICD-10-CM | POA: Diagnosis not present

## 2021-09-21 DIAGNOSIS — Z86718 Personal history of other venous thrombosis and embolism: Secondary | ICD-10-CM | POA: Diagnosis not present

## 2021-10-06 DIAGNOSIS — Z7901 Long term (current) use of anticoagulants: Secondary | ICD-10-CM | POA: Diagnosis not present

## 2021-10-06 DIAGNOSIS — Z780 Asymptomatic menopausal state: Secondary | ICD-10-CM | POA: Diagnosis not present

## 2021-10-18 DIAGNOSIS — J432 Centrilobular emphysema: Secondary | ICD-10-CM | POA: Diagnosis not present

## 2021-10-21 DIAGNOSIS — Z7901 Long term (current) use of anticoagulants: Secondary | ICD-10-CM | POA: Diagnosis not present

## 2021-11-15 DIAGNOSIS — J432 Centrilobular emphysema: Secondary | ICD-10-CM | POA: Diagnosis not present

## 2021-12-16 DIAGNOSIS — J432 Centrilobular emphysema: Secondary | ICD-10-CM | POA: Diagnosis not present

## 2021-12-24 DIAGNOSIS — M2041 Other hammer toe(s) (acquired), right foot: Secondary | ICD-10-CM | POA: Diagnosis not present

## 2021-12-24 DIAGNOSIS — L600 Ingrowing nail: Secondary | ICD-10-CM | POA: Diagnosis not present

## 2021-12-24 DIAGNOSIS — B351 Tinea unguium: Secondary | ICD-10-CM | POA: Diagnosis not present

## 2021-12-24 DIAGNOSIS — M79674 Pain in right toe(s): Secondary | ICD-10-CM | POA: Diagnosis not present

## 2021-12-24 DIAGNOSIS — M2011 Hallux valgus (acquired), right foot: Secondary | ICD-10-CM | POA: Diagnosis not present

## 2021-12-24 DIAGNOSIS — M79675 Pain in left toe(s): Secondary | ICD-10-CM | POA: Diagnosis not present

## 2021-12-31 DIAGNOSIS — I2510 Atherosclerotic heart disease of native coronary artery without angina pectoris: Secondary | ICD-10-CM | POA: Diagnosis not present

## 2021-12-31 DIAGNOSIS — I10 Essential (primary) hypertension: Secondary | ICD-10-CM | POA: Diagnosis not present

## 2021-12-31 DIAGNOSIS — M5136 Other intervertebral disc degeneration, lumbar region: Secondary | ICD-10-CM | POA: Diagnosis not present

## 2021-12-31 DIAGNOSIS — E785 Hyperlipidemia, unspecified: Secondary | ICD-10-CM | POA: Diagnosis not present

## 2021-12-31 DIAGNOSIS — I350 Nonrheumatic aortic (valve) stenosis: Secondary | ICD-10-CM | POA: Diagnosis not present

## 2021-12-31 DIAGNOSIS — R739 Hyperglycemia, unspecified: Secondary | ICD-10-CM | POA: Diagnosis not present

## 2021-12-31 DIAGNOSIS — D692 Other nonthrombocytopenic purpura: Secondary | ICD-10-CM | POA: Diagnosis not present

## 2021-12-31 DIAGNOSIS — J9611 Chronic respiratory failure with hypoxia: Secondary | ICD-10-CM | POA: Diagnosis not present

## 2021-12-31 DIAGNOSIS — D696 Thrombocytopenia, unspecified: Secondary | ICD-10-CM | POA: Diagnosis not present

## 2021-12-31 DIAGNOSIS — I739 Peripheral vascular disease, unspecified: Secondary | ICD-10-CM | POA: Diagnosis not present

## 2021-12-31 DIAGNOSIS — F32A Depression, unspecified: Secondary | ICD-10-CM | POA: Diagnosis not present

## 2021-12-31 DIAGNOSIS — J449 Chronic obstructive pulmonary disease, unspecified: Secondary | ICD-10-CM | POA: Diagnosis not present

## 2022-01-04 DIAGNOSIS — Z9981 Dependence on supplemental oxygen: Secondary | ICD-10-CM | POA: Diagnosis not present

## 2022-01-04 DIAGNOSIS — J449 Chronic obstructive pulmonary disease, unspecified: Secondary | ICD-10-CM | POA: Diagnosis not present

## 2022-01-04 DIAGNOSIS — R0609 Other forms of dyspnea: Secondary | ICD-10-CM | POA: Diagnosis not present

## 2022-01-15 DIAGNOSIS — J432 Centrilobular emphysema: Secondary | ICD-10-CM | POA: Diagnosis not present

## 2022-02-15 DIAGNOSIS — Z7901 Long term (current) use of anticoagulants: Secondary | ICD-10-CM | POA: Diagnosis not present

## 2022-02-15 DIAGNOSIS — J432 Centrilobular emphysema: Secondary | ICD-10-CM | POA: Diagnosis not present

## 2022-03-17 DIAGNOSIS — Z86718 Personal history of other venous thrombosis and embolism: Secondary | ICD-10-CM | POA: Diagnosis not present

## 2022-04-14 DIAGNOSIS — Z86718 Personal history of other venous thrombosis and embolism: Secondary | ICD-10-CM | POA: Diagnosis not present

## 2022-04-14 DIAGNOSIS — I824Z2 Acute embolism and thrombosis of unspecified deep veins of left distal lower extremity: Secondary | ICD-10-CM | POA: Diagnosis not present

## 2022-04-17 DIAGNOSIS — J432 Centrilobular emphysema: Secondary | ICD-10-CM | POA: Diagnosis not present

## 2022-05-18 DIAGNOSIS — J432 Centrilobular emphysema: Secondary | ICD-10-CM | POA: Diagnosis not present

## 2022-05-19 DIAGNOSIS — Z7901 Long term (current) use of anticoagulants: Secondary | ICD-10-CM | POA: Diagnosis not present

## 2022-05-19 DIAGNOSIS — Z86718 Personal history of other venous thrombosis and embolism: Secondary | ICD-10-CM | POA: Diagnosis not present

## 2022-06-02 DIAGNOSIS — Z86718 Personal history of other venous thrombosis and embolism: Secondary | ICD-10-CM | POA: Diagnosis not present

## 2022-06-02 DIAGNOSIS — Z7901 Long term (current) use of anticoagulants: Secondary | ICD-10-CM | POA: Diagnosis not present

## 2022-06-17 DIAGNOSIS — J432 Centrilobular emphysema: Secondary | ICD-10-CM | POA: Diagnosis not present

## 2022-06-28 DIAGNOSIS — D68311 Acquired hemophilia: Secondary | ICD-10-CM | POA: Diagnosis not present

## 2022-06-28 DIAGNOSIS — Z7901 Long term (current) use of anticoagulants: Secondary | ICD-10-CM | POA: Diagnosis not present

## 2022-07-05 DIAGNOSIS — Z0000 Encounter for general adult medical examination without abnormal findings: Secondary | ICD-10-CM | POA: Diagnosis not present

## 2022-07-05 DIAGNOSIS — G63 Polyneuropathy in diseases classified elsewhere: Secondary | ICD-10-CM | POA: Diagnosis not present

## 2022-07-05 DIAGNOSIS — Z87891 Personal history of nicotine dependence: Secondary | ICD-10-CM | POA: Diagnosis not present

## 2022-07-05 DIAGNOSIS — F32A Depression, unspecified: Secondary | ICD-10-CM | POA: Diagnosis not present

## 2022-07-05 DIAGNOSIS — D696 Thrombocytopenia, unspecified: Secondary | ICD-10-CM | POA: Diagnosis not present

## 2022-07-05 DIAGNOSIS — J449 Chronic obstructive pulmonary disease, unspecified: Secondary | ICD-10-CM | POA: Diagnosis not present

## 2022-07-05 DIAGNOSIS — D692 Other nonthrombocytopenic purpura: Secondary | ICD-10-CM | POA: Diagnosis not present

## 2022-07-05 DIAGNOSIS — I739 Peripheral vascular disease, unspecified: Secondary | ICD-10-CM | POA: Diagnosis not present

## 2022-07-05 DIAGNOSIS — Z23 Encounter for immunization: Secondary | ICD-10-CM | POA: Diagnosis not present

## 2022-07-05 DIAGNOSIS — M5136 Other intervertebral disc degeneration, lumbar region: Secondary | ICD-10-CM | POA: Diagnosis not present

## 2022-07-05 DIAGNOSIS — J9611 Chronic respiratory failure with hypoxia: Secondary | ICD-10-CM | POA: Diagnosis not present

## 2022-07-05 DIAGNOSIS — I2510 Atherosclerotic heart disease of native coronary artery without angina pectoris: Secondary | ICD-10-CM | POA: Diagnosis not present

## 2022-07-05 DIAGNOSIS — I10 Essential (primary) hypertension: Secondary | ICD-10-CM | POA: Diagnosis not present

## 2022-07-18 DIAGNOSIS — J432 Centrilobular emphysema: Secondary | ICD-10-CM | POA: Diagnosis not present

## 2022-07-26 DIAGNOSIS — M5136 Other intervertebral disc degeneration, lumbar region: Secondary | ICD-10-CM | POA: Diagnosis not present

## 2022-07-26 DIAGNOSIS — M545 Low back pain, unspecified: Secondary | ICD-10-CM | POA: Diagnosis not present

## 2022-07-26 DIAGNOSIS — M4316 Spondylolisthesis, lumbar region: Secondary | ICD-10-CM | POA: Diagnosis not present

## 2022-08-11 DIAGNOSIS — I890 Lymphedema, not elsewhere classified: Secondary | ICD-10-CM | POA: Diagnosis not present

## 2022-08-11 DIAGNOSIS — I350 Nonrheumatic aortic (valve) stenosis: Secondary | ICD-10-CM | POA: Diagnosis not present

## 2022-08-17 DIAGNOSIS — J432 Centrilobular emphysema: Secondary | ICD-10-CM | POA: Diagnosis not present

## 2022-09-01 IMAGING — MR MR LUMBAR SPINE W/O CM
5 series · 30 of 48 positions shown · non-contrast
Comparison: None.

CLINICAL DATA: Low back pain, worsening on the left side. Bilateral
leg weakness.

EXAM:
MRI LUMBAR SPINE WITHOUT CONTRAST
TECHNIQUE: Multiplanar, multisequence MR imaging of the lumbar spine was
performed. No intravenous contrast was administered.

[Series 5: T2 · sagittal · 4.0mm · 0.81mm/px · 6 of 17 slices shown (1 of 2)]
[im 1/17]
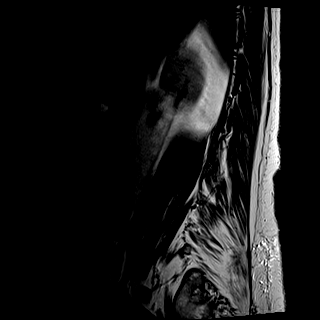
[im 4/17]
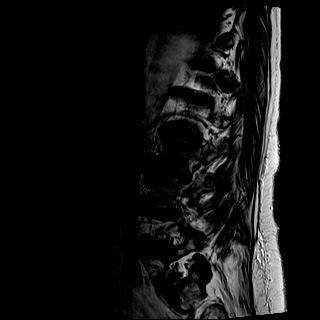
[im 7/17]
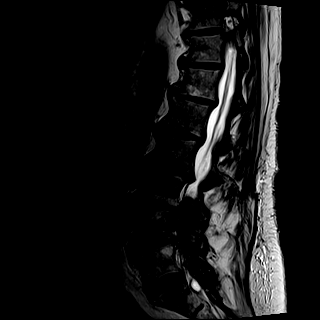
[im 10/17]
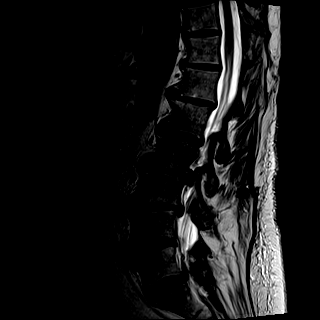
[im 13/17]
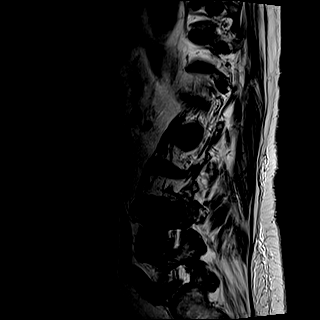
[im 17/17]
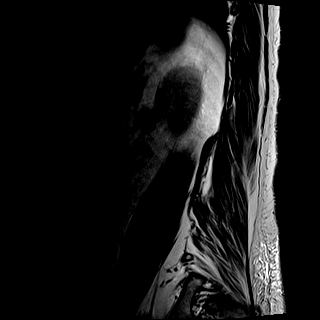

[Series 6: T1 · sagittal · 4.0mm · 0.81mm/px · 7 of 17 slices shown (1 of 2)]
[im 1/17]
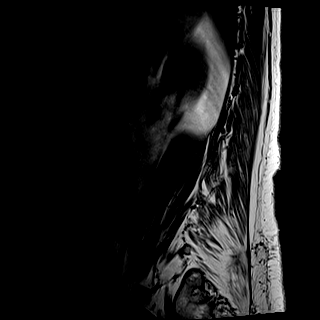
[im 3/17]
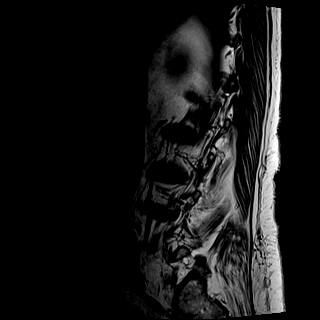
[im 6/17]
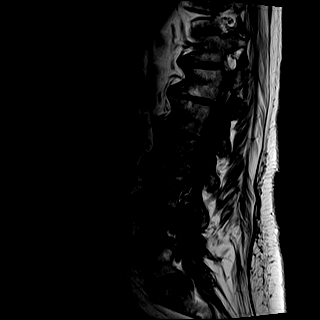
[im 9/17]
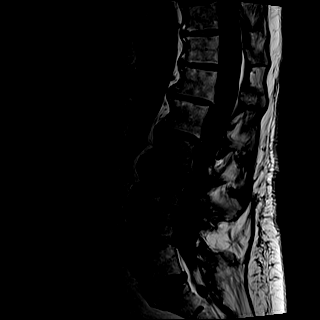
[im 11/17]
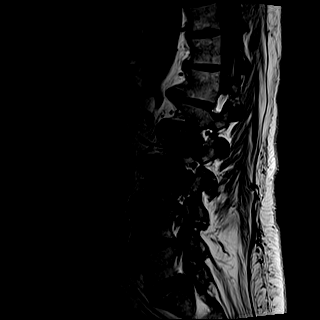
[im 14/17]
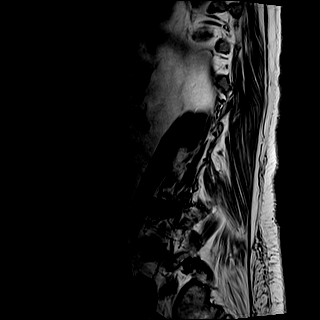
[im 17/17]
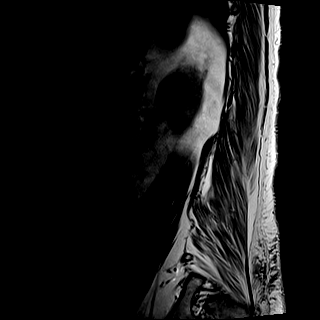

[Series 7: STIR · sagittal · 4.0mm · 0.41mm/px · 1 of 17 slices shown]
[im 1/17]
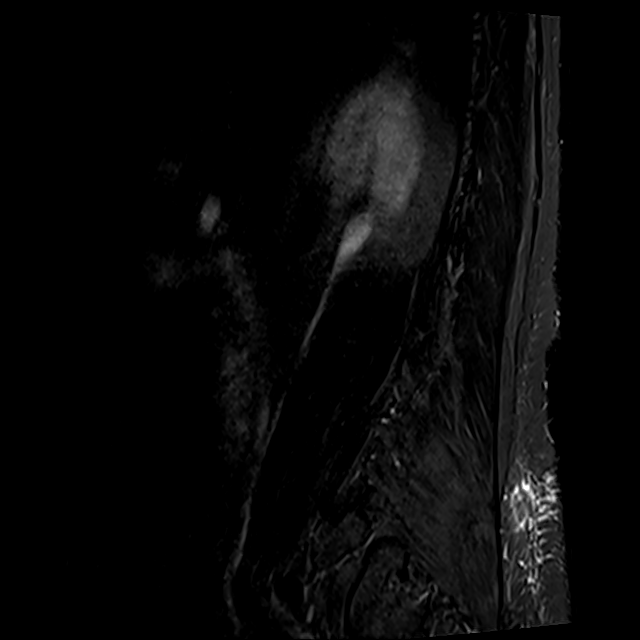

[Series 8: T2 · axial · 4.0mm · 0.78mm/px · z∈[-60,+136]mm · 8 of 33 slices shown (2 of 2)]
[im 1/33]
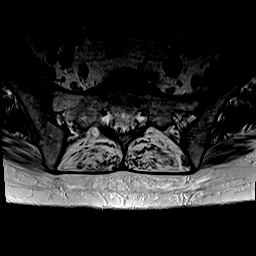
[im 5/33]
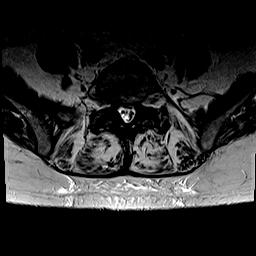
[im 10/33]
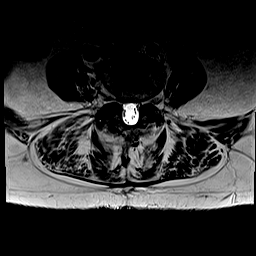
[im 15/33]
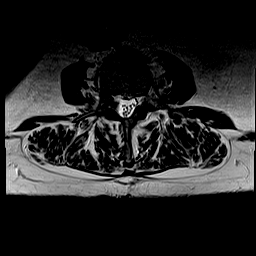
[im 18/33]
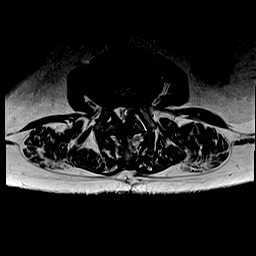
[im 23/33]
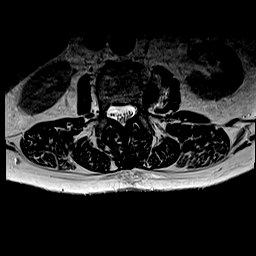
[im 28/33]
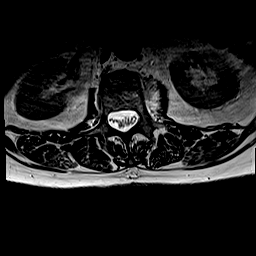
[im 33/33]
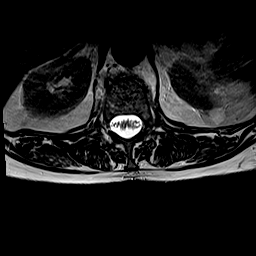

[Series 9: T1 · axial · 4.0mm · 0.39mm/px · z∈[-60,+136]mm · 8 of 33 slices shown (2 of 2)]
[im 1/33]
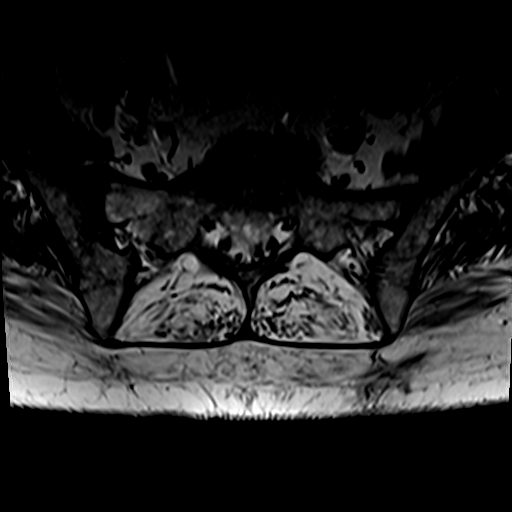
[im 5/33]
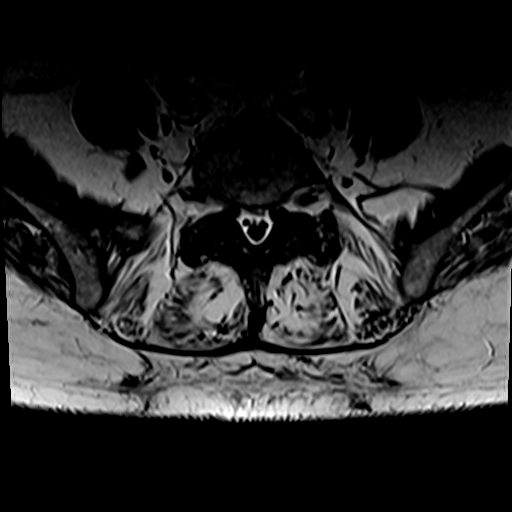
[im 10/33]
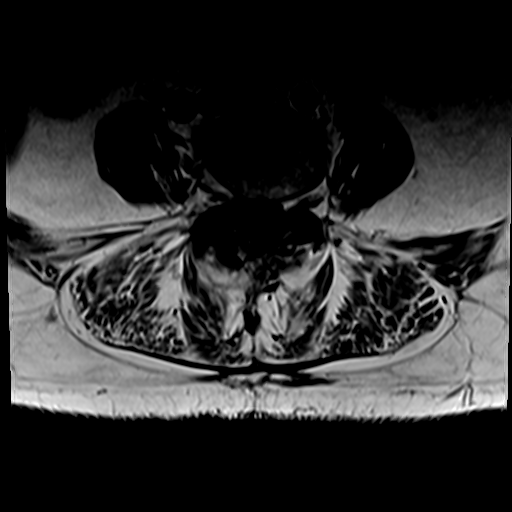
[im 15/33]
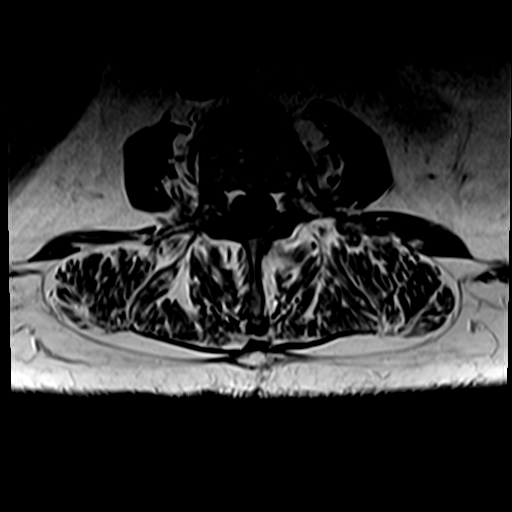
[im 18/33]
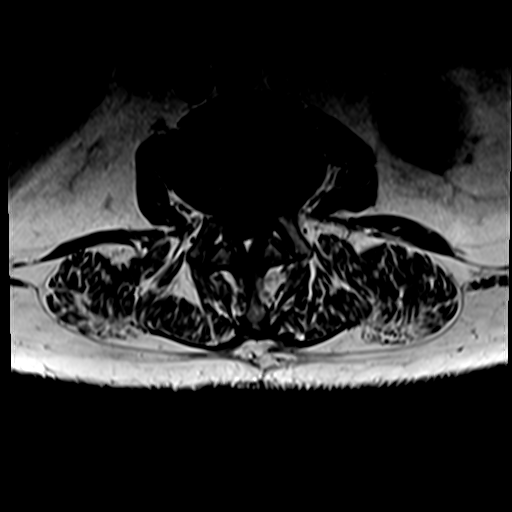
[im 23/33]
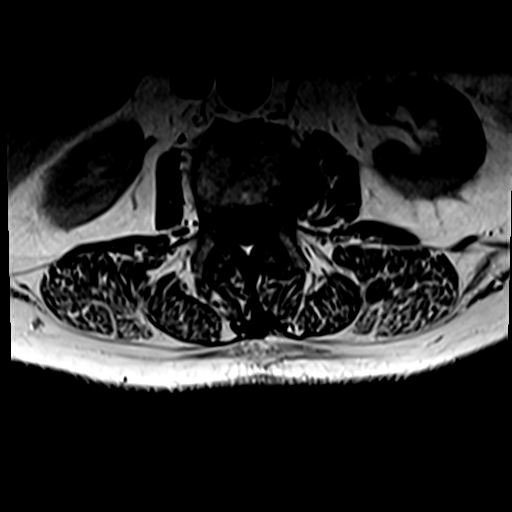
[im 28/33]
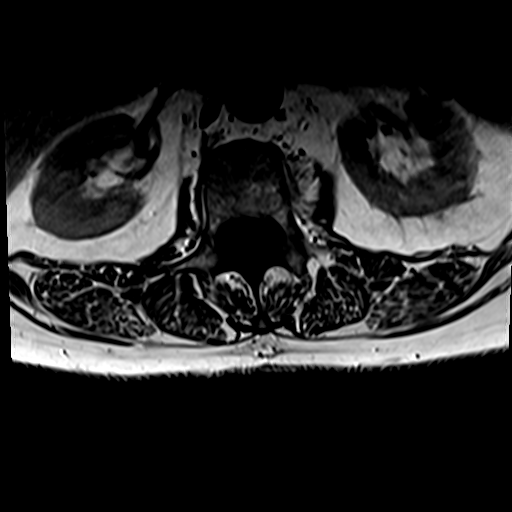
[im 33/33]
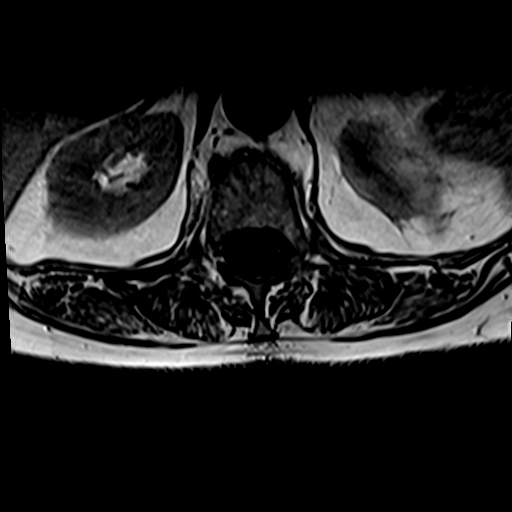

[30 of 48 positions shown; findings below may reference images not displayed]

FINDINGS: Segmentation: Standard segmentation is assumed. The inferior-most
fully formed intervertebral disc is labeled L5-S1.

Alignment: Stepwise grade 1 retrolisthesis of T12 on L1, L1 on L2,
L2 on L3, L3 on L4. Mild dextrocurvature of the upper lumbar spine
and levocurvature of the lower lumbar spine.

Vertebrae: Degenerative/discogenic endplate signal changes about the
left eccentric L1-L2 and L2-L3 disc in the right eccentric L4-L5
disc. No specific evidence of acute fracture or
discitis/osteomyelitis.

Conus medullaris and cauda equina: Conus extends to the T12 level.
Conus appears normal on sagittal imaging.

Paraspinal and other soft tissues: Exophytic left renal cyst.

Disc levels:

T12-L1: Mild disc bulging without significant canal or foraminal
stenosis.

L1-L2: Left eccentric disc bulge with far lateral disc/osteophyte.
Mild left foraminal stenosis and mild left subarticular recess
stenosis with disc contacting the descending left L2 nerve root
without definite impingement. No significant central canal stenosis.

L2-L3: Left eccentric disc bulge with inferiorly dissecting central
component. Bilateral facet hypertrophy with ligamentum flavum
thickening. Resulting mild central canal stenosis with moderate to
severe left and moderate right subarticular recess stenosis.
Suspected impingement of the left and possibly the right L3
descending nerve roots (see series 8, image 16.

L3-L4: Endplate spurring and broad disc bulge with right greater
than left facet hypertrophy. Ligamentum flavum thickening. Resulting
severe right greater than left subarticular recess stenosis with
suspected impingement of the descending nerve roots bilaterally.
Moderate canal stenosis at this level, predominantly due to
transverse narrowing of the canal. Moderate to severe right and
moderate left foraminal stenosis at this level. Left far
lateral/extraforaminal disc contacts and displaces the
exiting/exited left L3 nerve.

L4-L5: Broad disc bulge and bilateral facet hypertrophy. Prior
posterior decompression. Intermediate T1 signal material in the
right foramen, possibly scar tissue. No significant canal or left
foraminal stenosis.

L5-S1: Posterior disc bulging and bilateral facet hypertrophy with
mild to moderate left greater than right subarticular recess
stenosis. Disc contacts bilateral descending S1 nerve roots. No
significant foraminal stenosis. Multiple Tarlov cysts within the
visualized sacral canal.
IMPRESSION: 1. At L3-L4, severe right greater than left subarticular recess
stenosis with suspected impingement of bilateral descending L4 nerve
roots. Moderate canal stenosis, predominantly due to transverse
narrowing of the canal. Moderate to severe right and moderate left
foraminal stenosis with far lateral/extraforaminal disc contacting
and displacing the exiting/exited left L3 nerve.
2. At L2-L3, moderate to severe left and moderate right subarticular
recess stenosis with suspected impingement of the descending left
and possibly right L3 nerve roots. Mild canal stenosis at this
level.
3. At L4-L5, postsurgical changes of posterior decompression with
intermediate T1 signal material in the proximal right foramen,
possibly scar tissue. Postcontrast imaging could further
characterize and help differentiate scar from disc if clinically
indicated.
4. At L5-S1, mild to moderate left greater than right subarticular
recess stenosis with disc contacting bilateral descending S1 nerve
roots.

## 2022-09-17 DIAGNOSIS — J432 Centrilobular emphysema: Secondary | ICD-10-CM | POA: Diagnosis not present

## 2022-10-18 DIAGNOSIS — J432 Centrilobular emphysema: Secondary | ICD-10-CM | POA: Diagnosis not present

## 2022-11-16 DIAGNOSIS — M79675 Pain in left toe(s): Secondary | ICD-10-CM | POA: Diagnosis not present

## 2022-11-16 DIAGNOSIS — J432 Centrilobular emphysema: Secondary | ICD-10-CM | POA: Diagnosis not present

## 2022-11-16 DIAGNOSIS — B351 Tinea unguium: Secondary | ICD-10-CM | POA: Diagnosis not present

## 2022-11-16 DIAGNOSIS — M79674 Pain in right toe(s): Secondary | ICD-10-CM | POA: Diagnosis not present

## 2022-12-17 DIAGNOSIS — J432 Centrilobular emphysema: Secondary | ICD-10-CM | POA: Diagnosis not present

## 2023-01-03 DIAGNOSIS — G629 Polyneuropathy, unspecified: Secondary | ICD-10-CM | POA: Diagnosis not present

## 2023-01-03 DIAGNOSIS — M5136 Other intervertebral disc degeneration, lumbar region: Secondary | ICD-10-CM | POA: Diagnosis not present

## 2023-01-03 DIAGNOSIS — F32A Depression, unspecified: Secondary | ICD-10-CM | POA: Diagnosis not present

## 2023-01-03 DIAGNOSIS — D696 Thrombocytopenia, unspecified: Secondary | ICD-10-CM | POA: Diagnosis not present

## 2023-01-03 DIAGNOSIS — R7303 Prediabetes: Secondary | ICD-10-CM | POA: Diagnosis not present

## 2023-01-03 DIAGNOSIS — I739 Peripheral vascular disease, unspecified: Secondary | ICD-10-CM | POA: Diagnosis not present

## 2023-01-03 DIAGNOSIS — D692 Other nonthrombocytopenic purpura: Secondary | ICD-10-CM | POA: Diagnosis not present

## 2023-01-03 DIAGNOSIS — J9611 Chronic respiratory failure with hypoxia: Secondary | ICD-10-CM | POA: Diagnosis not present

## 2023-01-03 DIAGNOSIS — J449 Chronic obstructive pulmonary disease, unspecified: Secondary | ICD-10-CM | POA: Diagnosis not present

## 2023-01-03 DIAGNOSIS — I2510 Atherosclerotic heart disease of native coronary artery without angina pectoris: Secondary | ICD-10-CM | POA: Diagnosis not present

## 2023-01-03 DIAGNOSIS — I10 Essential (primary) hypertension: Secondary | ICD-10-CM | POA: Diagnosis not present

## 2023-01-16 DIAGNOSIS — J432 Centrilobular emphysema: Secondary | ICD-10-CM | POA: Diagnosis not present

## 2023-02-14 DIAGNOSIS — Z7901 Long term (current) use of anticoagulants: Secondary | ICD-10-CM | POA: Diagnosis not present

## 2023-02-14 DIAGNOSIS — Z86718 Personal history of other venous thrombosis and embolism: Secondary | ICD-10-CM | POA: Diagnosis not present

## 2023-02-16 DIAGNOSIS — J432 Centrilobular emphysema: Secondary | ICD-10-CM | POA: Diagnosis not present

## 2023-04-01 ENCOUNTER — Emergency Department: Payer: Medicare HMO

## 2023-04-01 ENCOUNTER — Other Ambulatory Visit: Payer: Self-pay

## 2023-04-01 ENCOUNTER — Inpatient Hospital Stay
Admission: EM | Admit: 2023-04-01 | Discharge: 2023-04-02 | DRG: 084 | Disposition: A | Discharge status: Other Acute Inpatient Hospital | Payer: Medicare HMO | Attending: Internal Medicine | Admitting: Internal Medicine

## 2023-04-01 DIAGNOSIS — S06361A Traumatic hemorrhage of cerebrum, unspecified, with loss of consciousness of 30 minutes or less, initial encounter: Principal | ICD-10-CM

## 2023-04-01 DIAGNOSIS — R58 Hemorrhage, not elsewhere classified: Secondary | ICD-10-CM | POA: Diagnosis not present

## 2023-04-01 DIAGNOSIS — S0512XA Contusion of eyeball and orbital tissues, left eye, initial encounter: Secondary | ICD-10-CM | POA: Diagnosis not present

## 2023-04-01 DIAGNOSIS — Z86718 Personal history of other venous thrombosis and embolism: Secondary | ICD-10-CM

## 2023-04-01 DIAGNOSIS — Z87891 Personal history of nicotine dependence: Secondary | ICD-10-CM

## 2023-04-01 DIAGNOSIS — F32A Depression, unspecified: Secondary | ICD-10-CM | POA: Diagnosis present

## 2023-04-01 DIAGNOSIS — Z9981 Dependence on supplemental oxygen: Secondary | ICD-10-CM

## 2023-04-01 DIAGNOSIS — R000 Tachycardia, unspecified: Secondary | ICD-10-CM | POA: Diagnosis not present

## 2023-04-01 DIAGNOSIS — S0635AA Traumatic hemorrhage of left cerebrum with loss of consciousness status unknown, initial encounter: Principal | ICD-10-CM | POA: Diagnosis present

## 2023-04-01 DIAGNOSIS — I10 Essential (primary) hypertension: Secondary | ICD-10-CM | POA: Diagnosis present

## 2023-04-01 DIAGNOSIS — G4733 Obstructive sleep apnea (adult) (pediatric): Secondary | ICD-10-CM | POA: Diagnosis present

## 2023-04-01 DIAGNOSIS — M549 Dorsalgia, unspecified: Secondary | ICD-10-CM | POA: Diagnosis not present

## 2023-04-01 DIAGNOSIS — D696 Thrombocytopenia, unspecified: Secondary | ICD-10-CM | POA: Diagnosis present

## 2023-04-01 DIAGNOSIS — R9089 Other abnormal findings on diagnostic imaging of central nervous system: Secondary | ICD-10-CM | POA: Diagnosis not present

## 2023-04-01 DIAGNOSIS — Z79899 Other long term (current) drug therapy: Secondary | ICD-10-CM

## 2023-04-01 DIAGNOSIS — Z7982 Long term (current) use of aspirin: Secondary | ICD-10-CM

## 2023-04-01 DIAGNOSIS — W19XXXA Unspecified fall, initial encounter: Secondary | ICD-10-CM | POA: Diagnosis not present

## 2023-04-01 DIAGNOSIS — J449 Chronic obstructive pulmonary disease, unspecified: Secondary | ICD-10-CM | POA: Diagnosis present

## 2023-04-01 DIAGNOSIS — G8929 Other chronic pain: Secondary | ICD-10-CM | POA: Diagnosis present

## 2023-04-01 DIAGNOSIS — I619 Nontraumatic intracerebral hemorrhage, unspecified: Secondary | ICD-10-CM | POA: Diagnosis present

## 2023-04-01 DIAGNOSIS — I2510 Atherosclerotic heart disease of native coronary artery without angina pectoris: Secondary | ICD-10-CM | POA: Diagnosis present

## 2023-04-01 DIAGNOSIS — S0083XA Contusion of other part of head, initial encounter: Secondary | ICD-10-CM | POA: Diagnosis not present

## 2023-04-01 DIAGNOSIS — Y92000 Kitchen of unspecified non-institutional (private) residence as  the place of occurrence of the external cause: Secondary | ICD-10-CM

## 2023-04-01 DIAGNOSIS — W010XXA Fall on same level from slipping, tripping and stumbling without subsequent striking against object, initial encounter: Secondary | ICD-10-CM | POA: Diagnosis present

## 2023-04-01 DIAGNOSIS — S40022A Contusion of left upper arm, initial encounter: Secondary | ICD-10-CM | POA: Diagnosis present

## 2023-04-01 DIAGNOSIS — Z887 Allergy status to serum and vaccine status: Secondary | ICD-10-CM

## 2023-04-01 DIAGNOSIS — S0285XA Fracture of orbit, unspecified, initial encounter for closed fracture: Secondary | ICD-10-CM

## 2023-04-01 DIAGNOSIS — I6523 Occlusion and stenosis of bilateral carotid arteries: Secondary | ICD-10-CM | POA: Diagnosis not present

## 2023-04-01 DIAGNOSIS — Z955 Presence of coronary angioplasty implant and graft: Secondary | ICD-10-CM

## 2023-04-01 DIAGNOSIS — Z7901 Long term (current) use of anticoagulants: Secondary | ICD-10-CM

## 2023-04-01 DIAGNOSIS — S0232XA Fracture of orbital floor, left side, initial encounter for closed fracture: Secondary | ICD-10-CM | POA: Diagnosis present

## 2023-04-01 DIAGNOSIS — Z743 Need for continuous supervision: Secondary | ICD-10-CM | POA: Diagnosis not present

## 2023-04-01 LAB — CBC
HCT: 41.9 % (ref 36.0–46.0)
Hemoglobin: 14.6 g/dL (ref 12.0–15.0)
MCH: 32.6 pg (ref 26.0–34.0)
MCHC: 34.8 g/dL (ref 30.0–36.0)
MCV: 93.5 fL (ref 80.0–100.0)
Platelets: 149 10*3/uL — ABNORMAL LOW (ref 150–400)
RBC: 4.48 MIL/uL (ref 3.87–5.11)
RDW: 13.2 % (ref 11.5–15.5)
WBC: 6.2 10*3/uL (ref 4.0–10.5)
nRBC: 0 % (ref 0.0–0.2)

## 2023-04-01 LAB — BASIC METABOLIC PANEL
Anion gap: 9 (ref 5–15)
BUN: 18 mg/dL (ref 8–23)
CO2: 26 mmol/L (ref 22–32)
Calcium: 9 mg/dL (ref 8.9–10.3)
Chloride: 99 mmol/L (ref 98–111)
Creatinine, Ser: 0.97 mg/dL (ref 0.44–1.00)
GFR, Estimated: 58 mL/min — ABNORMAL LOW (ref >60)
Glucose, Bld: 173 mg/dL — ABNORMAL HIGH (ref 70–99)
Potassium: 3.5 mmol/L (ref 3.5–5.1)
Sodium: 134 mmol/L — ABNORMAL LOW (ref 135–145)

## 2023-04-01 LAB — PROTIME-INR
INR: 2.9 — ABNORMAL HIGH (ref 0.8–1.2)
Prothrombin Time: 30.8 seconds — ABNORMAL HIGH (ref 11.4–15.2)

## 2023-04-01 MED ORDER — PROTHROMBIN COMPLEX CONC HUMAN 500 UNITS IV KIT
1500.0000 [IU] | PACK | Status: AC
  Administered 2023-04-01: 1500 [IU] via INTRAVENOUS
  Filled 2023-04-01: qty 1000

## 2023-04-01 MED ORDER — VITAMIN K1 10 MG/ML IJ SOLN
10.0000 mg | INTRAVENOUS | Status: AC
  Administered 2023-04-02: 10 mg via INTRAVENOUS
  Filled 2023-04-01: qty 1

## 2023-04-01 MED ORDER — LIDOCAINE-EPINEPHRINE 2 %-1:100000 IJ SOLN
20.0000 mL | Freq: Once | INTRAMUSCULAR | Status: DC
  Administered 2023-04-01: 20 mL via INTRADERMAL
  Filled 2023-04-01: qty 1

## 2023-04-01 MED ORDER — ACETAMINOPHEN 500 MG PO TABS
1000.0000 mg | ORAL_TABLET | ORAL | Status: AC
  Administered 2023-04-01: 1000 mg via ORAL
  Filled 2023-04-01: qty 2

## 2023-04-01 NOTE — ED Triage Notes (Signed)
Patient arrived from home via [facility] due to a fall in the kitchen.   Patient is on a warfarin, + LOC, hematoma to the left eye and l upper and lower arm.

## 2023-04-01 NOTE — ED Provider Notes (Signed)
[facility] Provider Note    Event Date/Time   First MD Initiated Contact with Patient 04/01/23 1958     (approximate)   History   Fall   HPI  Carol Riley is a 82 y.o. female history of coronary disease COPD hypertension DVT active anticoagulation  Patient reports she was at her house and she was in her kitchen and the next thing she remembers that she woke up on the floor.  She knows that she has bruising and swelling around her eye.  Reports that happened this evening.  She was in her normal health until it happened.  EMS reports there was ice on the floor and they question if she might of tripped or slipped.  Patient reports mild soreness around her left eye.  No headache no neck pain.  Mild chronic back pain which is not new.  Denies any other injury denies pain in her legs arms or hips but has noticed some bruising over her left upper arm.  She is on Coumadin     Physical Exam   Triage Vital Signs: ED Triage Vitals  Encounter Vitals Group     BP      Systolic BP Percentile      Diastolic BP Percentile      Pulse      Resp      Temp      Temp src      SpO2      Weight      Height      Head Circumference      Peak Flow      Pain Score      Pain Loc      Pain Education      Exclude from Growth Chart    Vitals:   04/01/23 2000 04/01/23 2006  BP: (!) 151/82   Pulse: 80 81  Resp:  16  Temp:    SpO2: 98% 100%     Most recent vital signs: Vitals:   04/01/23 2330 04/01/23 2345  BP: (!) 157/58   Pulse: 82 84  Resp: (!) 22 15  Temp:    SpO2: 94% 92%     General: Awake, no distress.  Normocephalic atraumatic with exception to a approximately 1 inch very thin and superficial laceration just lateral to the left canthus with moderate periorbital edema vs supervcial abrasion (it appears to be quite shallow), bleeding controlled with bandaging.  She is able to open her eyes without having to have the left eye pried open, and she has  moderate edema and bruising surrounding the left orbit.  Extraocular movements are normal and she reports her vision is normal in both eyes.  No double vision in any direction Mild left-sided epistaxis, no septal hematoma. CV:  Good peripheral perfusion.  Normal tones and rate Resp:  Normal effort.  Clear bilateral Abd:  No distention.  Nontender nondistended Ice tenderness to palpation along the cervical and thoracic spine.  No step-offs or deformities.  Demonstrates good range of motion of the cervical spine without pain or discomfort.  Moves all extremities well no neurovascular deficits  Other:  Demonstrates normal range of motion of the upper extremities bilateral lower extremities bilateral hips etc.  No other injuries noted except she has a fairly large ecchymosis over her mid left humerus and slight swelling there, but she reports the area is just slightly sore and demonstrates good range of motion  Patient reports she sees her doctor tetanus  should be up-to-date  ED Results / Procedures / Treatments   Labs (all labs ordered are listed, but only abnormal results are displayed) Labs Reviewed  CBC - Abnormal; Notable for the following components:      Result Value   Platelets 149 (*)    All other components within normal limits  BASIC METABOLIC PANEL - Abnormal; Notable for the following components:   Sodium 134 (*)    Glucose, Bld 173 (*)    GFR, Estimated 58 (*)    All other components within normal limits  PROTIME-INR - Abnormal; Notable for the following components:   Prothrombin Time 30.8 (*)    INR 2.9 (*)    All other components within normal limits  PROTIME-INR  PROTIME-INR     EKG  Entered by me at 2010 heart rate 80 QRS 90 QTc 480 Normal sinus rhythm, occasional bigeminy.  No evidence of acute ischemia   RADIOLOGY  I personally interpreted the patient's CT scan of the head is concerning for an area of hyperdensity.  Further discussed with our radiologist as  well as Dr. Ernestine Mcmurray of neurosurgery   CT Head Wo Contrast  Addendum Date: 04/01/2023   ADDENDUM REPORT: 04/01/2023 22:01 ADDENDUM: These results were called by telephone at the time of interpretation on 04/01/2023 at 10:01 pm to provider Rosalio Catterton , who verbally acknowledged these results. Electronically Signed   By: Tish Frederickson M.D.   On: 04/01/2023 22:01   Result Date: 04/01/2023 CLINICAL DATA:  Facial trauma, blunt; Neck trauma (Age >= 65y) EXAM: CT HEAD WITHOUT CONTRAST CT MAXILLOFACIAL WITHOUT CONTRAST CT CERVICAL SPINE WITHOUT CONTRAST TECHNIQUE: Multidetector CT imaging of the head, cervical spine, and maxillofacial structures were performed using the standard protocol without intravenous contrast. Multiplanar CT image reconstructions of the cervical spine and maxillofacial structures were also generated. RADIATION DOSE REDUCTION: This exam was performed according to the departmental dose-optimization program which includes automated exposure control, adjustment of the mA and/or kV according to patient size and/or use of iterative reconstruction technique. COMPARISON:  CT head 06/17/2018 FINDINGS: CT HEAD FINDINGS Brain: Cerebral ventricle sizes are concordant with the degree of cerebral volume loss. Patchy and confluent areas of decreased attenuation are noted throughout the deep and periventricular white matter of the cerebral hemispheres bilaterally, compatible with chronic microvascular ischemic disease. No evidence of large-territorial acute infarction. No intraparenchymal hemorrhage. No mass lesion. No extra-axial collection. No mass effect or midline shift. An 8 mm rounded hyperdensity within the frontal horn of the left lateral ventricle.No hydrocephalus. Basilar cisterns are patent. Vascular: No hyperdense vessel. Atherosclerotic calcifications are present within the cavernous internal carotid arteries. Skull: No acute fracture or focal lesion. Other: Left frontal scalp hematoma  formation. CT MAXILLOFACIAL FINDINGS Osseous: Comminuted and displaced 3 mm depressed left orbital floor fracture. Sinuses/Orbits: Blood products partially opacified left maxillary sinus. Paranasal sinuses and mastoid air cells are clear. Bilateral lens replacement. Otherwise the orbits are unremarkable. Soft tissues: Left maxillary subcutaneus soft tissue hematoma formation. Left periorbital subcutaneus soft tissue hematoma formation. CT CERVICAL SPINE FINDINGS Alignment: Normal. Skull base and vertebrae: Multilevel moderate severe degenerative changes of the spine with associated multilevel posterior disc osteophyte complex formation. At least moderate to severe osseous neural foraminal stenosis at the C3-C4 level, right C4 - C5 level. No severe osseous central canal stenosis. No acute fracture. No aggressive appearing focal osseous lesion or focal pathologic process. Soft tissues and spinal canal: No prevertebral fluid or swelling. No visible canal hematoma. Upper chest:  Unremarkable. Other: None. IMPRESSION: 1. An 8 mm rounded hyperdensity within the frontal horn of the left lateral ventricle suggestive of blood products and acute intraventricular hemorrhage. No associated hydrocephalus. Differential diagnosis includes a mass. 2. Comminuted and displaced 3 mm depressed left orbital floor fracture. Correlate with physical exam for extraocular muscle entrapment. 3. No acute displaced fracture or traumatic listhesis of the cervical spine. Electronically Signed: By: Tish Frederickson M.D. On: 04/01/2023 21:55   CT Maxillofacial Wo Contrast  Addendum Date: 04/01/2023   ADDENDUM REPORT: 04/01/2023 22:01 ADDENDUM: These results were called by telephone at the time of interpretation on 04/01/2023 at 10:01 pm to provider Antonetta Clanton , who verbally acknowledged these results. Electronically Signed   By: Tish Frederickson M.D.   On: 04/01/2023 22:01   Result Date: 04/01/2023 CLINICAL DATA:  Facial trauma, blunt; Neck trauma  (Age >= 65y) EXAM: CT HEAD WITHOUT CONTRAST CT MAXILLOFACIAL WITHOUT CONTRAST CT CERVICAL SPINE WITHOUT CONTRAST TECHNIQUE: Multidetector CT imaging of the head, cervical spine, and maxillofacial structures were performed using the standard protocol without intravenous contrast. Multiplanar CT image reconstructions of the cervical spine and maxillofacial structures were also generated. RADIATION DOSE REDUCTION: This exam was performed according to the departmental dose-optimization program which includes automated exposure control, adjustment of the mA and/or kV according to patient size and/or use of iterative reconstruction technique. COMPARISON:  CT head 06/17/2018 FINDINGS: CT HEAD FINDINGS Brain: Cerebral ventricle sizes are concordant with the degree of cerebral volume loss. Patchy and confluent areas of decreased attenuation are noted throughout the deep and periventricular white matter of the cerebral hemispheres bilaterally, compatible with chronic microvascular ischemic disease. No evidence of large-territorial acute infarction. No intraparenchymal hemorrhage. No mass lesion. No extra-axial collection. No mass effect or midline shift. An 8 mm rounded hyperdensity within the frontal horn of the left lateral ventricle.No hydrocephalus. Basilar cisterns are patent. Vascular: No hyperdense vessel. Atherosclerotic calcifications are present within the cavernous internal carotid arteries. Skull: No acute fracture or focal lesion. Other: Left frontal scalp hematoma formation. CT MAXILLOFACIAL FINDINGS Osseous: Comminuted and displaced 3 mm depressed left orbital floor fracture. Sinuses/Orbits: Blood products partially opacified left maxillary sinus. Paranasal sinuses and mastoid air cells are clear. Bilateral lens replacement. Otherwise the orbits are unremarkable. Soft tissues: Left maxillary subcutaneus soft tissue hematoma formation. Left periorbital subcutaneus soft tissue hematoma formation. CT CERVICAL  SPINE FINDINGS Alignment: Normal. Skull base and vertebrae: Multilevel moderate severe degenerative changes of the spine with associated multilevel posterior disc osteophyte complex formation. At least moderate to severe osseous neural foraminal stenosis at the C3-C4 level, right C4 - C5 level. No severe osseous central canal stenosis. No acute fracture. No aggressive appearing focal osseous lesion or focal pathologic process. Soft tissues and spinal canal: No prevertebral fluid or swelling. No visible canal hematoma. Upper chest: Unremarkable. Other: None. IMPRESSION: 1. An 8 mm rounded hyperdensity within the frontal horn of the left lateral ventricle suggestive of blood products and acute intraventricular hemorrhage. No associated hydrocephalus. Differential diagnosis includes a mass. 2. Comminuted and displaced 3 mm depressed left orbital floor fracture. Correlate with physical exam for extraocular muscle entrapment. 3. No acute displaced fracture or traumatic listhesis of the cervical spine. Electronically Signed: By: Tish Frederickson M.D. On: 04/01/2023 21:55   CT Cervical Spine Wo Contrast  Addendum Date: 04/01/2023   ADDENDUM REPORT: 04/01/2023 22:01 ADDENDUM: These results were called by telephone at the time of interpretation on 04/01/2023 at 10:01 pm to provider Cleburn Maiolo , who verbally  acknowledged these results. Electronically Signed   By: Tish Frederickson M.D.   On: 04/01/2023 22:01   Result Date: 04/01/2023 CLINICAL DATA:  Facial trauma, blunt; Neck trauma (Age >= 65y) EXAM: CT HEAD WITHOUT CONTRAST CT MAXILLOFACIAL WITHOUT CONTRAST CT CERVICAL SPINE WITHOUT CONTRAST TECHNIQUE: Multidetector CT imaging of the head, cervical spine, and maxillofacial structures were performed using the standard protocol without intravenous contrast. Multiplanar CT image reconstructions of the cervical spine and maxillofacial structures were also generated. RADIATION DOSE REDUCTION: This exam was performed according to  the departmental dose-optimization program which includes automated exposure control, adjustment of the mA and/or kV according to patient size and/or use of iterative reconstruction technique. COMPARISON:  CT head 06/17/2018 FINDINGS: CT HEAD FINDINGS Brain: Cerebral ventricle sizes are concordant with the degree of cerebral volume loss. Patchy and confluent areas of decreased attenuation are noted throughout the deep and periventricular white matter of the cerebral hemispheres bilaterally, compatible with chronic microvascular ischemic disease. No evidence of large-territorial acute infarction. No intraparenchymal hemorrhage. No mass lesion. No extra-axial collection. No mass effect or midline shift. An 8 mm rounded hyperdensity within the frontal horn of the left lateral ventricle.No hydrocephalus. Basilar cisterns are patent. Vascular: No hyperdense vessel. Atherosclerotic calcifications are present within the cavernous internal carotid arteries. Skull: No acute fracture or focal lesion. Other: Left frontal scalp hematoma formation. CT MAXILLOFACIAL FINDINGS Osseous: Comminuted and displaced 3 mm depressed left orbital floor fracture. Sinuses/Orbits: Blood products partially opacified left maxillary sinus. Paranasal sinuses and mastoid air cells are clear. Bilateral lens replacement. Otherwise the orbits are unremarkable. Soft tissues: Left maxillary subcutaneus soft tissue hematoma formation. Left periorbital subcutaneus soft tissue hematoma formation. CT CERVICAL SPINE FINDINGS Alignment: Normal. Skull base and vertebrae: Multilevel moderate severe degenerative changes of the spine with associated multilevel posterior disc osteophyte complex formation. At least moderate to severe osseous neural foraminal stenosis at the C3-C4 level, right C4 - C5 level. No severe osseous central canal stenosis. No acute fracture. No aggressive appearing focal osseous lesion or focal pathologic process. Soft tissues and spinal  canal: No prevertebral fluid or swelling. No visible canal hematoma. Upper chest: Unremarkable. Other: None. IMPRESSION: 1. An 8 mm rounded hyperdensity within the frontal horn of the left lateral ventricle suggestive of blood products and acute intraventricular hemorrhage. No associated hydrocephalus. Differential diagnosis includes a mass. 2. Comminuted and displaced 3 mm depressed left orbital floor fracture. Correlate with physical exam for extraocular muscle entrapment. 3. No acute displaced fracture or traumatic listhesis of the cervical spine. Electronically Signed: By: Tish Frederickson M.D. On: 04/01/2023 21:55   DG Humerus Left  Result Date: 04/01/2023 CLINICAL DATA:  Fall, swelling, upper arm pain. EXAM: LEFT HUMERUS - 2+ VIEW COMPARISON:  None Available. FINDINGS: There is no evidence of fracture or other focal bone lesions. Superior subluxation of the humeral head abutting the undersurface of the acromion. Elbow alignment is maintained. There is soft tissue edema in the upper lateral arm. IMPRESSION: 1. No fracture of the left humerus. 2. Lateral soft tissue edema. 3. Superior subluxation of the humeral head abutting the undersurface of the acromion consistent with chronic rotator cuff arthropathy. Electronically Signed   By: Narda Rutherford M.D.   On: 04/01/2023 20:47      PROCEDURES:  Critical Care performed: Yes, see critical care procedure note(s)  CRITICAL CARE Performed by: Sharyn Creamer   Total critical care time: 40 minutes  Critical care time was exclusive of separately billable procedures and treating other patients.  Critical care was necessary to treat or prevent imminent or life-threatening deterioration.  Critical care was time spent personally by me on the following activities: development of treatment plan with patient and/or surrogate as well as nursing, discussions with consultants, evaluation of patient's response to treatment, examination of patient, obtaining  history from patient or surrogate, ordering and performing treatments and interventions, ordering and review of laboratory studies, ordering and review of radiographic studies, pulse oximetry and re-evaluation of patient's condition.   Procedures   MEDICATIONS ORDERED IN ED: Medications  acetaminophen (TYLENOL) tablet 1,000 mg (1,000 mg Oral Given 04/01/23 2055)  lidocaine-EPINEPHrine (XYLOCAINE W/EPI) 2 %-1:100000 (with pres) injection 20 mL (20 mLs Intradermal Given 04/01/23 2217)  prothrombin complex conc human (KCENTRA) IVPB 1,500 Units (0 Units Intravenous Stopped 04/02/23 0004)  phytonadione (VITAMIN K) 10 mg in dextrose 5 % 50 mL IVPB (0 mg Intravenous Stopped 04/02/23 0022)     IMPRESSION / MDM / ASSESSMENT AND PLAN / ED COURSE  I reviewed the triage vital signs and the nursing notes.                              Differential diagnosis includes, but is not limited to, orbital injury, facial fracture, intracranial hemorrhage, syncope, concussion, traumatic brain injury etc.  Patient denies any preceding symptoms of illness but he is unclear what caused her to fall, perhaps she slipped but is unknown.  She does recall waking on the floor.  She has obvious left periorbital swelling, but no proptosis intact vision, no conjunctival hematoma and no hyphema.  No evidence of cervical injury by examination and exam.  Neurologically intact no evidence of injury to extremities except for over the left mid humerus quite bit of bruising which I will order an x-ray though clinically seems unlikely to represent underlying fracture.  Patient's presentation is most consistent with acute presentation with potential threat to life or bodily function.   The patient is on the cardiac monitor to evaluate for evidence of arrhythmia and/or significant heart rate changes.    Clinical Course as of 04/02/23 0032  Sat Apr 01, 2023  2200 Radiology advises concern for possible mass or hemorrhage in the lateral  vent horn.  [MQ]  2242 Initially, discussed with patient and granddaughter in agreement with plan to transfer to [facility].  After patient's additional family arrived they have changed her mind, and requesting transfer to [facility] now.  Patient is awake alert in no distress.  I have not yet heard back from [facility] trauma surgery, at this juncture I think is reasonable to attempt transfer to [facility] for trauma evaluation.  Patient and family understanding agreeable with the plan [MQ]  2320 [facility] called back, advises trauma doctor aware of the transfer request but as present cannot return call as they are actively tied up/busy.  Awaiting callback from [facility] trauma team [MQ]    Clinical Course User Index [MQ] Sharyn Creamer, MD   Ongoing care and disposition assigned to Dr. Megan Salon.  We are currently awaiting callback from [facility] trauma surgeon to discuss case and request patient transfer to center with neurosurgical capabilities capable of managing the patient's concern for potential intracranial hemorrhage.  I have discussed and reviewed this case with Dr. Ernestine Mcmurray of neurosurgery, his recommendation is given the location of hemorrhage although mass cannot initially be excluded, but given her clinical history of immediate concern is for acute hemorrhage and  he recommends she be transferred to a facility with capable neurosurgical coverage is such as [facility] or [facility]  The patient and her family wish to transfer to [facility], they decline transfer to [facility] stating strong preference for [facility].  We did discuss that [facility] may be the most expeditious and closest location, but they wish to await transfer to [facility] at this time understanding the need for rapid transfer or being at a center with appropriate neurosurgical services in the event of worsening of hemorrhage  Discussed reversal of Coumadin with the patient and family at the bedside.  Discussed with our  pharmacist, [facility] ordered as recommended by our pharmacist.    FINAL CLINICAL IMPRESSION(S) / ED DIAGNOSES   Final diagnoses:  Traumatic intracerebral hemorrhage with loss of consciousness of 30 minutes or less, unspecified laterality, initial encounter (HCC)  Anticoagulated  Closed fracture of orbit, initial encounter (HCC)  Contusion of left upper extremity, initial encounter     Rx / DC Orders   ED Discharge Orders     None        Note:  This document was prepared using Dragon voice recognition software and may include unintentional dictation errors.   Sharyn Creamer, MD 04/02/23 [phone]

## 2023-04-01 NOTE — ED Notes (Signed)
Pt asking to use the restroom. RN informed pt that since we haven't done the CT scan yet we could help her with the bedpan. Pt states "Never mind I'll hold it" RN informed pt that it might be a while. Pt given her call light and instructed to call if she changes her mind. Rn cleaned up Pt's neck and new 2x2s placed. Family at bedside.

## 2023-04-02 ENCOUNTER — Inpatient Hospital Stay (HOSPITAL_COMMUNITY)
Admission: RE | Admit: 2023-04-02 | Discharge: 2023-04-06 | DRG: 083 | Disposition: A | Discharge status: Home Health | Payer: Medicare HMO | Source: Other Acute Inpatient Hospital | Attending: Internal Medicine | Admitting: Internal Medicine

## 2023-04-02 ENCOUNTER — Encounter (HOSPITAL_COMMUNITY): Payer: Self-pay

## 2023-04-02 ENCOUNTER — Emergency Department: Payer: Medicare HMO

## 2023-04-02 ENCOUNTER — Inpatient Hospital Stay (HOSPITAL_COMMUNITY): Payer: Medicare HMO

## 2023-04-02 DIAGNOSIS — M79622 Pain in left upper arm: Secondary | ICD-10-CM | POA: Diagnosis not present

## 2023-04-02 DIAGNOSIS — H25019 Cortical age-related cataract, unspecified eye: Secondary | ICD-10-CM | POA: Diagnosis not present

## 2023-04-02 DIAGNOSIS — D6832 Hemorrhagic disorder due to extrinsic circulating anticoagulants: Secondary | ICD-10-CM | POA: Diagnosis present

## 2023-04-02 DIAGNOSIS — I480 Paroxysmal atrial fibrillation: Secondary | ICD-10-CM | POA: Diagnosis present

## 2023-04-02 DIAGNOSIS — I6782 Cerebral ischemia: Secondary | ICD-10-CM | POA: Diagnosis not present

## 2023-04-02 DIAGNOSIS — I350 Nonrheumatic aortic (valve) stenosis: Secondary | ICD-10-CM | POA: Diagnosis not present

## 2023-04-02 DIAGNOSIS — I615 Nontraumatic intracerebral hemorrhage, intraventricular: Secondary | ICD-10-CM | POA: Diagnosis not present

## 2023-04-02 DIAGNOSIS — Z79899 Other long term (current) drug therapy: Secondary | ICD-10-CM

## 2023-04-02 DIAGNOSIS — E876 Hypokalemia: Secondary | ICD-10-CM | POA: Diagnosis present

## 2023-04-02 DIAGNOSIS — Z66 Do not resuscitate: Secondary | ICD-10-CM | POA: Diagnosis not present

## 2023-04-02 DIAGNOSIS — G4733 Obstructive sleep apnea (adult) (pediatric): Secondary | ICD-10-CM | POA: Diagnosis not present

## 2023-04-02 DIAGNOSIS — E785 Hyperlipidemia, unspecified: Secondary | ICD-10-CM | POA: Diagnosis not present

## 2023-04-02 DIAGNOSIS — Z955 Presence of coronary angioplasty implant and graft: Secondary | ICD-10-CM | POA: Diagnosis not present

## 2023-04-02 DIAGNOSIS — J449 Chronic obstructive pulmonary disease, unspecified: Secondary | ICD-10-CM | POA: Diagnosis present

## 2023-04-02 DIAGNOSIS — S069X9A Unspecified intracranial injury with loss of consciousness of unspecified duration, initial encounter: Secondary | ICD-10-CM | POA: Diagnosis not present

## 2023-04-02 DIAGNOSIS — Z887 Allergy status to serum and vaccine status: Secondary | ICD-10-CM | POA: Diagnosis not present

## 2023-04-02 DIAGNOSIS — S0292XA Unspecified fracture of facial bones, initial encounter for closed fracture: Secondary | ICD-10-CM | POA: Diagnosis not present

## 2023-04-02 DIAGNOSIS — G8929 Other chronic pain: Secondary | ICD-10-CM | POA: Diagnosis not present

## 2023-04-02 DIAGNOSIS — I471 Supraventricular tachycardia, unspecified: Secondary | ICD-10-CM | POA: Diagnosis not present

## 2023-04-02 DIAGNOSIS — D696 Thrombocytopenia, unspecified: Secondary | ICD-10-CM | POA: Diagnosis present

## 2023-04-02 DIAGNOSIS — R9089 Other abnormal findings on diagnostic imaging of central nervous system: Secondary | ICD-10-CM | POA: Diagnosis not present

## 2023-04-02 DIAGNOSIS — Z86711 Personal history of pulmonary embolism: Secondary | ICD-10-CM

## 2023-04-02 DIAGNOSIS — I609 Nontraumatic subarachnoid hemorrhage, unspecified: Secondary | ICD-10-CM | POA: Diagnosis not present

## 2023-04-02 DIAGNOSIS — Z7982 Long term (current) use of aspirin: Secondary | ICD-10-CM | POA: Diagnosis not present

## 2023-04-02 DIAGNOSIS — S0232XA Fracture of orbital floor, left side, initial encounter for closed fracture: Secondary | ICD-10-CM | POA: Diagnosis present

## 2023-04-02 DIAGNOSIS — I2510 Atherosclerotic heart disease of native coronary artery without angina pectoris: Secondary | ICD-10-CM | POA: Diagnosis not present

## 2023-04-02 DIAGNOSIS — S0003XA Contusion of scalp, initial encounter: Secondary | ICD-10-CM | POA: Diagnosis not present

## 2023-04-02 DIAGNOSIS — R55 Syncope and collapse: Principal | ICD-10-CM

## 2023-04-02 DIAGNOSIS — Z9981 Dependence on supplemental oxygen: Secondary | ICD-10-CM

## 2023-04-02 DIAGNOSIS — Z87891 Personal history of nicotine dependence: Secondary | ICD-10-CM

## 2023-04-02 DIAGNOSIS — Z8541 Personal history of malignant neoplasm of cervix uteri: Secondary | ICD-10-CM

## 2023-04-02 DIAGNOSIS — S0083XA Contusion of other part of head, initial encounter: Secondary | ICD-10-CM | POA: Diagnosis not present

## 2023-04-02 DIAGNOSIS — Z8616 Personal history of COVID-19: Secondary | ICD-10-CM

## 2023-04-02 DIAGNOSIS — S06359A Traumatic hemorrhage of left cerebrum with loss of consciousness of unspecified duration, initial encounter: Secondary | ICD-10-CM | POA: Diagnosis not present

## 2023-04-02 DIAGNOSIS — R296 Repeated falls: Secondary | ICD-10-CM | POA: Diagnosis present

## 2023-04-02 DIAGNOSIS — F32A Depression, unspecified: Secondary | ICD-10-CM | POA: Diagnosis not present

## 2023-04-02 DIAGNOSIS — Z7901 Long term (current) use of anticoagulants: Secondary | ICD-10-CM

## 2023-04-02 DIAGNOSIS — S0636AA Traumatic hemorrhage of cerebrum, unspecified, with loss of consciousness status unknown, initial encounter: Secondary | ICD-10-CM | POA: Diagnosis not present

## 2023-04-02 DIAGNOSIS — W010XXA Fall on same level from slipping, tripping and stumbling without subsequent striking against object, initial encounter: Secondary | ICD-10-CM | POA: Diagnosis not present

## 2023-04-02 DIAGNOSIS — W1830XA Fall on same level, unspecified, initial encounter: Secondary | ICD-10-CM | POA: Diagnosis not present

## 2023-04-02 DIAGNOSIS — Y92000 Kitchen of unspecified non-institutional (private) residence as  the place of occurrence of the external cause: Secondary | ICD-10-CM | POA: Diagnosis not present

## 2023-04-02 DIAGNOSIS — I619 Nontraumatic intracerebral hemorrhage, unspecified: Secondary | ICD-10-CM | POA: Diagnosis not present

## 2023-04-02 DIAGNOSIS — Z86718 Personal history of other venous thrombosis and embolism: Secondary | ICD-10-CM | POA: Diagnosis not present

## 2023-04-02 DIAGNOSIS — I825Z2 Chronic embolism and thrombosis of unspecified deep veins of left distal lower extremity: Secondary | ICD-10-CM | POA: Diagnosis not present

## 2023-04-02 DIAGNOSIS — S0635AA Traumatic hemorrhage of left cerebrum with loss of consciousness status unknown, initial encounter: Secondary | ICD-10-CM | POA: Diagnosis not present

## 2023-04-02 DIAGNOSIS — S40022A Contusion of left upper arm, initial encounter: Secondary | ICD-10-CM | POA: Diagnosis not present

## 2023-04-02 DIAGNOSIS — I10 Essential (primary) hypertension: Secondary | ICD-10-CM | POA: Diagnosis present

## 2023-04-02 DIAGNOSIS — I6523 Occlusion and stenosis of bilateral carotid arteries: Secondary | ICD-10-CM | POA: Diagnosis not present

## 2023-04-02 DIAGNOSIS — H1132 Conjunctival hemorrhage, left eye: Secondary | ICD-10-CM | POA: Diagnosis not present

## 2023-04-02 DIAGNOSIS — M549 Dorsalgia, unspecified: Secondary | ICD-10-CM | POA: Diagnosis not present

## 2023-04-02 DIAGNOSIS — I739 Peripheral vascular disease, unspecified: Secondary | ICD-10-CM | POA: Diagnosis present

## 2023-04-02 DIAGNOSIS — S0512XA Contusion of eyeball and orbital tissues, left eye, initial encounter: Secondary | ICD-10-CM | POA: Diagnosis not present

## 2023-04-02 DIAGNOSIS — J9611 Chronic respiratory failure with hypoxia: Secondary | ICD-10-CM | POA: Diagnosis present

## 2023-04-02 DIAGNOSIS — Z8673 Personal history of transient ischemic attack (TIA), and cerebral infarction without residual deficits: Secondary | ICD-10-CM

## 2023-04-02 HISTORY — DX: Unspecified intracranial injury with loss of consciousness of unspecified duration, initial encounter: S06.9X9A

## 2023-04-02 LAB — BASIC METABOLIC PANEL
Anion gap: 8 (ref 5–15)
BUN: 16 mg/dL (ref 8–23)
CO2: 29 mmol/L (ref 22–32)
Calcium: 9 mg/dL (ref 8.9–10.3)
Chloride: 99 mmol/L (ref 98–111)
Creatinine, Ser: 0.9 mg/dL (ref 0.44–1.00)
GFR, Estimated: >60 mL/min (ref >60)
Glucose, Bld: 155 mg/dL — ABNORMAL HIGH (ref 70–99)
Potassium: 3.5 mmol/L (ref 3.5–5.1)
Sodium: 136 mmol/L (ref 135–145)

## 2023-04-02 LAB — PROTIME-INR
INR: 1.3 — ABNORMAL HIGH (ref 0.8–1.2)
INR: 1.2 (ref 0.8–1.2)
Prothrombin Time: 15.9 s — ABNORMAL HIGH (ref 11.4–15.2)
Prothrombin Time: 15.6 s — ABNORMAL HIGH (ref 11.4–15.2)

## 2023-04-02 LAB — CBC
HCT: 41.1 % (ref 36.0–46.0)
Hemoglobin: 14.3 g/dL (ref 12.0–15.0)
MCH: 32.4 pg (ref 26.0–34.0)
MCHC: 34.8 g/dL (ref 30.0–36.0)
MCV: 93 fL (ref 80.0–100.0)
Platelets: 147 10*3/uL — ABNORMAL LOW (ref 150–400)
RBC: 4.42 MIL/uL (ref 3.87–5.11)
RDW: 13.1 % (ref 11.5–15.5)
WBC: 7.1 10*3/uL (ref 4.0–10.5)
nRBC: 0 % (ref 0.0–0.2)

## 2023-04-02 LAB — MAGNESIUM
Magnesium: 1.5 mg/dL — ABNORMAL LOW (ref 1.7–2.4)
Magnesium: 1.3 mg/dL — ABNORMAL LOW (ref 1.7–2.4)

## 2023-04-02 LAB — TROPONIN I (HIGH SENSITIVITY): Troponin I (High Sensitivity): 12 ng/L (ref <18)

## 2023-04-02 LAB — PHOSPHORUS: Phosphorus: 3.3 mg/dL (ref 2.5–4.6)

## 2023-04-02 LAB — MRSA NEXT GEN BY PCR, NASAL: MRSA by PCR Next Gen: NOT DETECTED

## 2023-04-02 MED ORDER — SENNOSIDES-DOCUSATE SODIUM 8.6-50 MG PO TABS
1.0000 | ORAL_TABLET | Freq: Two times a day (BID) | ORAL | Status: DC
  Filled 2023-04-02: qty 1

## 2023-04-02 MED ORDER — ORAL CARE MOUTH RINSE: 15.0000 mL | OROMUCOSAL | Status: DC | PRN

## 2023-04-02 MED ORDER — POLYETHYLENE GLYCOL 3350 17 G PO PACK: 17.0000 g | PACK | Freq: Every day | ORAL | Status: DC | PRN

## 2023-04-02 MED ORDER — DOCUSATE SODIUM 100 MG PO CAPS: 100.0000 mg | ORAL_CAPSULE | Freq: Two times a day (BID) | ORAL | Status: DC | PRN

## 2023-04-02 MED ORDER — IPRATROPIUM-ALBUTEROL 0.5-2.5 (3) MG/3ML IN SOLN: 3.0000 mL | Freq: Four times a day (QID) | RESPIRATORY_TRACT | Status: DC | PRN

## 2023-04-02 MED ORDER — LABETALOL HCL 5 MG/ML IV SOLN: 20.0000 mg | Freq: Once | INTRAVENOUS | Status: DC

## 2023-04-02 MED ORDER — ACETAMINOPHEN 160 MG/5ML PO SOLN: 650.0000 mg | ORAL | Status: DC | PRN

## 2023-04-02 MED ORDER — STROKE: EARLY STAGES OF RECOVERY BOOK: Freq: Once | Status: DC

## 2023-04-02 MED ORDER — CHLORHEXIDINE GLUCONATE CLOTH 2 % EX PADS
6.0000 | MEDICATED_PAD | Freq: Every day | CUTANEOUS | Status: DC
  Administered 2023-04-02 – 2023-04-06 (×4): 6 via TOPICAL

## 2023-04-02 MED ORDER — LABETALOL HCL 5 MG/ML IV SOLN
5.0000 mg | INTRAVENOUS | Status: DC | PRN
  Administered 2023-04-02: 5 mg via INTRAVENOUS
  Filled 2023-04-02: qty 4

## 2023-04-02 MED ORDER — CLEVIDIPINE BUTYRATE 0.5 MG/ML IV EMUL: 0.0000 mg/h | INTRAVENOUS | Status: DC

## 2023-04-02 MED ORDER — LABETALOL HCL 5 MG/ML IV SOLN
10.0000 mg | INTRAVENOUS | Status: DC | PRN
  Administered 2023-04-02: 10 mg via INTRAVENOUS
  Filled 2023-04-02: qty 4

## 2023-04-02 MED ORDER — PANTOPRAZOLE SODIUM 40 MG IV SOLR: 40.0000 mg | Freq: Every day | INTRAVENOUS | Status: DC

## 2023-04-02 MED ORDER — LISINOPRIL 20 MG PO TABS
40.0000 mg | ORAL_TABLET | Freq: Every day | ORAL | Status: DC
  Administered 2023-04-02 – 2023-04-06 (×5): 40 mg via ORAL
  Filled 2023-04-02 (×5): qty 2

## 2023-04-02 MED ORDER — ACETAMINOPHEN 325 MG RE SUPP: 650.0000 mg | RECTAL | Status: DC | PRN

## 2023-04-02 MED ORDER — METOPROLOL TARTRATE 5 MG/5ML IV SOLN
2.5000 mg | INTRAVENOUS | Status: DC | PRN
  Administered 2023-04-02: 2.5 mg via INTRAVENOUS
  Administered 2023-04-04: 5 mg via INTRAVENOUS
  Filled 2023-04-02 (×2): qty 5

## 2023-04-02 MED ORDER — METOPROLOL SUCCINATE ER 25 MG PO TB24
12.5000 mg | ORAL_TABLET | Freq: Every day | ORAL | Status: DC
  Administered 2023-04-03 – 2023-04-06 (×4): 12.5 mg via ORAL
  Filled 2023-04-02 (×5): qty 1

## 2023-04-02 MED ORDER — ACETAMINOPHEN 325 MG PO TABS: 650.0000 mg | ORAL_TABLET | ORAL | Status: DC | PRN

## 2023-04-02 MED ORDER — MAGNESIUM SULFATE 2 GM/50ML IV SOLN
2.0000 g | Freq: Once | INTRAVENOUS | Status: AC
  Administered 2023-04-02: 2 g via INTRAVENOUS
  Filled 2023-04-02: qty 50

## 2023-04-02 MED ORDER — ACETAMINOPHEN 325 MG PO TABS
650.0000 mg | ORAL_TABLET | ORAL | Status: DC | PRN
  Administered 2023-04-03: 650 mg via ORAL
  Filled 2023-04-02: qty 2

## 2023-04-02 MED ORDER — SIMVASTATIN 20 MG PO TABS
20.0000 mg | ORAL_TABLET | Freq: Every day | ORAL | Status: DC
  Administered 2023-04-02 – 2023-04-05 (×4): 20 mg via ORAL
  Filled 2023-04-02 (×4): qty 1

## 2023-04-02 MED ORDER — ONDANSETRON HCL 4 MG/2ML IJ SOLN: 4.0000 mg | Freq: Four times a day (QID) | INTRAMUSCULAR | Status: DC | PRN

## 2023-04-02 NOTE — Consult Note (Signed)
Reason for Consult: Syncope with fall and intraventricular hemorrhage. Referring Physician: Critical care  Carol Riley is an 82 y.o. female.  HPI: 82 year old female with multiple medical issues including DVT on chronic Coumadin.  Patient with syncopal episode last night with resultant fall.  Workup demonstrated evidence of a small intraventricular hemorrhage without any parenchymal involvement.  Follow-up CT scan demonstrated some mild enlargement of her intraventricular hemorrhage again without the parenchymal involvement.  INR currently acceptable at 1.2.  Patient is amnestic to the events of her fall.  She otherwise feels reasonably good with some mild pain and swelling around her left eye.  No visual symptoms.  Past Medical History:  Diagnosis Date   Arthritis    Cancer (HCC)    cervical cancer   Cataract cortical, senile    COPD (chronic obstructive pulmonary disease) (HCC)    Coronary artery disease    Depression    DVT (deep venous thrombosis) (HCC)    Edema    FEET/LEGS   Hyperglycemia    Hypertension    Leukopenia    Neuropathy    Shortness of breath dyspnea    Sleep apnea    Thrombocytopenia (HCC)     Past Surgical History:  Procedure Laterality Date   BACK SURGERY     BREAST BIOPSY Left    benign needle   CHONDROPLASTY Left 05/13/2019   Procedure: CHONDROPLASTY;  Surgeon: Signa Kell, MD;  Location: [facility] ORS;  Service: Orthopedics;  Laterality: Left;   COLONOSCOPY     COLONOSCOPY WITH PROPOFOL N/A 02/24/2017   Procedure: COLONOSCOPY WITH PROPOFOL;  Surgeon: Christena Deem, MD;  Location: [facility];  Service: Endoscopy;  Laterality: N/A;   CORONARY ANGIOPLASTY     STENT   EYE SURGERY     GAS INSERTION Right 05/06/2015   Procedure: INSERTION OF GAS;  Surgeon: Marcelene Butte, MD;  Location: [facility] ORS;  Service: Ophthalmology;  Laterality: Right;   KNEE ARTHROSCOPY WITH MEDIAL MENISECTOMY Left 05/13/2019   Procedure: KNEE ARTHROSCOPY WITH PARTIAL  MEDIAL  and lateral MENISECTOMY;  Surgeon: Signa Kell, MD;  Location: [facility] OR[facility];  Service: Orthopedics;  Laterality: Left;   MEMBRANE PEEL Right 05/06/2015   Procedure: MEMBRANE PEEL;  Surgeon: Marcelene Butte, MD;  Location: [facility] ORS;  Service: Ophthalmology;  Laterality: Right;   PARS PLANA VITRECTOMY Right 05/06/2015   Procedure: PARS PLANA VITRECTOMY WITH 23 GAUGE;  Surgeon: Marcelene Butte, MD;  Location: [facility] ORS;  Service: Ophthalmology;  Laterality: Right;    Family History  Problem Relation Age of Onset   Breast cancer Sister 87   Cancer Mother     Social History:  reports that she has quit smoking. She has never used smokeless tobacco. She reports that she does not drink alcohol and does not use drugs.  Allergies:  Allergies  Allergen Reactions   Pneumovax [Pneumococcal Polysaccharide Vaccine] Swelling    Redness    Medications: I have reviewed the patient's current medications.  Results for orders placed or performed during the hospital encounter of 04/02/23 (from the past 48 hour(s))  MRSA Next Gen by PCR, Nasal     Status: None   Collection Time: 04/02/23  5:54 AM   Specimen: Nasal Mucosa; Nasal Swab  Result Value Ref Range   MRSA by PCR Next Gen NOT DETECTED NOT DETECTED    Comment: (NOTE) The GeneXpert MRSA Assay (FDA approved for NASAL specimens only), is one component of a comprehensive MRSA colonization surveillance program. It is not intended to diagnose MRSA  infection nor to guide or monitor treatment for MRSA infections. Test performance is not FDA approved in patients less than 102 years old. Performed at [facility], [address] [place], [place] [address]   Magnesium     Status: Abnormal   Collection Time: 04/02/23  6:15 AM  Result Value Ref Range   Magnesium 1.3 (L) 1.7 - 2.4 mg/dL    Comment: Performed at [facility], [address] [place], [place] [address]    CT Head Wo Contrast  Result Date: 04/02/2023 CLINICAL DATA:  Follow-up  examination for intracranial hemorrhage. EXAM: CT HEAD WITHOUT CONTRAST TECHNIQUE: Contiguous axial images were obtained from the base of the skull through the vertex without intravenous contrast. RADIATION DOSE REDUCTION: This exam was performed according to the departmental dose-optimization program which includes automated exposure control, adjustment of the mA and/or kV according to patient size and/or use of iterative reconstruction technique. COMPARISON:  CT from 04/01/2023. FINDINGS: Brain: Interval increase in size of small volume intraventricular hemorrhage within the left lateral ventricle, now measuring up to 11 mm. Trace layering blood present within the left occipital horn. Trace subarachnoid hemorrhage now seen at the left sylvian fissure. No other acute intracranial hemorrhage. No acute large vessel territory infarct. No mass lesion or midline shift. No hydrocephalus or significant extra-axial fluid collection. Vascular: No abnormal hyperdense vessel. Scattered vascular calcifications noted within the carotid siphons. Skull: Evolving left periorbital and facial contusion. Calvarium demonstrates no new finding. Sinuses/Orbits: Left-sided facial fracture with associated left maxillary hemosinus. Globes and orbital soft tissues demonstrate no other acute finding. Trace left mastoid effusion noted. Other: None. IMPRESSION: 1. Interval increase in size of small volume intraventricular hemorrhage within the left lateral ventricle, now measuring up to 11 mm. 2. Trace subarachnoid hemorrhage at the left Sylvian fissure. 3. Evolving left periorbital and facial contusion. Left-sided facial fractures described on prior exam. Electronically Signed   By: Rise Mu M.D.   On: 04/02/2023 02:35   CT Head Wo Contrast  Addendum Date: 04/01/2023   ADDENDUM REPORT: 04/01/2023 22:01 ADDENDUM: These results were called by telephone at the time of interpretation on 04/01/2023 at 10:01 pm to provider MARK QUALE ,  who verbally acknowledged these results. Electronically Signed   By: Tish Frederickson M.D.   On: 04/01/2023 22:01   Result Date: 04/01/2023 CLINICAL DATA:  Facial trauma, blunt; Neck trauma (Age >= 65y) EXAM: CT HEAD WITHOUT CONTRAST CT MAXILLOFACIAL WITHOUT CONTRAST CT CERVICAL SPINE WITHOUT CONTRAST TECHNIQUE: Multidetector CT imaging of the head, cervical spine, and maxillofacial structures were performed using the standard protocol without intravenous contrast. Multiplanar CT image reconstructions of the cervical spine and maxillofacial structures were also generated. RADIATION DOSE REDUCTION: This exam was performed according to the departmental dose-optimization program which includes automated exposure control, adjustment of the mA and/or kV according to patient size and/or use of iterative reconstruction technique. COMPARISON:  CT head 06/17/2018 FINDINGS: CT HEAD FINDINGS Brain: Cerebral ventricle sizes are concordant with the degree of cerebral volume loss. Patchy and confluent areas of decreased attenuation are noted throughout the deep and periventricular white matter of the cerebral hemispheres bilaterally, compatible with chronic microvascular ischemic disease. No evidence of large-territorial acute infarction. No intraparenchymal hemorrhage. No mass lesion. No extra-axial collection. No mass effect or midline shift. An 8 mm rounded hyperdensity within the frontal horn of the left lateral ventricle.No hydrocephalus. Basilar cisterns are patent. Vascular: No hyperdense vessel. Atherosclerotic calcifications are present within the cavernous internal carotid  arteries. Skull: No acute fracture or focal lesion. Other: Left frontal scalp hematoma formation. CT MAXILLOFACIAL FINDINGS Osseous: Comminuted and displaced 3 mm depressed left orbital floor fracture. Sinuses/Orbits: Blood products partially opacified left maxillary sinus. Paranasal sinuses and mastoid air cells are clear. Bilateral lens replacement.  Otherwise the orbits are unremarkable. Soft tissues: Left maxillary subcutaneus soft tissue hematoma formation. Left periorbital subcutaneus soft tissue hematoma formation. CT CERVICAL SPINE FINDINGS Alignment: Normal. Skull base and vertebrae: Multilevel moderate severe degenerative changes of the spine with associated multilevel posterior disc osteophyte complex formation. At least moderate to severe osseous neural foraminal stenosis at the C3-C4 level, right C4 - C5 level. No severe osseous central canal stenosis. No acute fracture. No aggressive appearing focal osseous lesion or focal pathologic process. Soft tissues and spinal canal: No prevertebral fluid or swelling. No visible canal hematoma. Upper chest: Unremarkable. Other: None. IMPRESSION: 1. An 8 mm rounded hyperdensity within the frontal horn of the left lateral ventricle suggestive of blood products and acute intraventricular hemorrhage. No associated hydrocephalus. Differential diagnosis includes a mass. 2. Comminuted and displaced 3 mm depressed left orbital floor fracture. Correlate with physical exam for extraocular muscle entrapment. 3. No acute displaced fracture or traumatic listhesis of the cervical spine. Electronically Signed: By: Tish Frederickson M.D. On: 04/01/2023 21:55   CT Maxillofacial Wo Contrast  Addendum Date: 04/01/2023   ADDENDUM REPORT: 04/01/2023 22:01 ADDENDUM: These results were called by telephone at the time of interpretation on 04/01/2023 at 10:01 pm to provider MARK QUALE , who verbally acknowledged these results. Electronically Signed   By: Tish Frederickson M.D.   On: 04/01/2023 22:01   Result Date: 04/01/2023 CLINICAL DATA:  Facial trauma, blunt; Neck trauma (Age >= 65y) EXAM: CT HEAD WITHOUT CONTRAST CT MAXILLOFACIAL WITHOUT CONTRAST CT CERVICAL SPINE WITHOUT CONTRAST TECHNIQUE: Multidetector CT imaging of the head, cervical spine, and maxillofacial structures were performed using the standard protocol without  intravenous contrast. Multiplanar CT image reconstructions of the cervical spine and maxillofacial structures were also generated. RADIATION DOSE REDUCTION: This exam was performed according to the departmental dose-optimization program which includes automated exposure control, adjustment of the mA and/or kV according to patient size and/or use of iterative reconstruction technique. COMPARISON:  CT head 06/17/2018 FINDINGS: CT HEAD FINDINGS Brain: Cerebral ventricle sizes are concordant with the degree of cerebral volume loss. Patchy and confluent areas of decreased attenuation are noted throughout the deep and periventricular white matter of the cerebral hemispheres bilaterally, compatible with chronic microvascular ischemic disease. No evidence of large-territorial acute infarction. No intraparenchymal hemorrhage. No mass lesion. No extra-axial collection. No mass effect or midline shift. An 8 mm rounded hyperdensity within the frontal horn of the left lateral ventricle.No hydrocephalus. Basilar cisterns are patent. Vascular: No hyperdense vessel. Atherosclerotic calcifications are present within the cavernous internal carotid arteries. Skull: No acute fracture or focal lesion. Other: Left frontal scalp hematoma formation. CT MAXILLOFACIAL FINDINGS Osseous: Comminuted and displaced 3 mm depressed left orbital floor fracture. Sinuses/Orbits: Blood products partially opacified left maxillary sinus. Paranasal sinuses and mastoid air cells are clear. Bilateral lens replacement. Otherwise the orbits are unremarkable. Soft tissues: Left maxillary subcutaneus soft tissue hematoma formation. Left periorbital subcutaneus soft tissue hematoma formation. CT CERVICAL SPINE FINDINGS Alignment: Normal. Skull base and vertebrae: Multilevel moderate severe degenerative changes of the spine with associated multilevel posterior disc osteophyte complex formation. At least moderate to severe osseous neural foraminal stenosis at the  C3-C4 level, right C4 - C5 level. No severe osseous central  canal stenosis. No acute fracture. No aggressive appearing focal osseous lesion or focal pathologic process. Soft tissues and spinal canal: No prevertebral fluid or swelling. No visible canal hematoma. Upper chest: Unremarkable. Other: None. IMPRESSION: 1. An 8 mm rounded hyperdensity within the frontal horn of the left lateral ventricle suggestive of blood products and acute intraventricular hemorrhage. No associated hydrocephalus. Differential diagnosis includes a mass. 2. Comminuted and displaced 3 mm depressed left orbital floor fracture. Correlate with physical exam for extraocular muscle entrapment. 3. No acute displaced fracture or traumatic listhesis of the cervical spine. Electronically Signed: By: Tish Frederickson M.D. On: 04/01/2023 21:55   CT Cervical Spine Wo Contrast  Addendum Date: 04/01/2023   ADDENDUM REPORT: 04/01/2023 22:01 ADDENDUM: These results were called by telephone at the time of interpretation on 04/01/2023 at 10:01 pm to provider MARK QUALE , who verbally acknowledged these results. Electronically Signed   By: Tish Frederickson M.D.   On: 04/01/2023 22:01   Result Date: 04/01/2023 CLINICAL DATA:  Facial trauma, blunt; Neck trauma (Age >= 65y) EXAM: CT HEAD WITHOUT CONTRAST CT MAXILLOFACIAL WITHOUT CONTRAST CT CERVICAL SPINE WITHOUT CONTRAST TECHNIQUE: Multidetector CT imaging of the head, cervical spine, and maxillofacial structures were performed using the standard protocol without intravenous contrast. Multiplanar CT image reconstructions of the cervical spine and maxillofacial structures were also generated. RADIATION DOSE REDUCTION: This exam was performed according to the departmental dose-optimization program which includes automated exposure control, adjustment of the mA and/or kV according to patient size and/or use of iterative reconstruction technique. COMPARISON:  CT head 06/17/2018 FINDINGS: CT HEAD FINDINGS Brain:  Cerebral ventricle sizes are concordant with the degree of cerebral volume loss. Patchy and confluent areas of decreased attenuation are noted throughout the deep and periventricular white matter of the cerebral hemispheres bilaterally, compatible with chronic microvascular ischemic disease. No evidence of large-territorial acute infarction. No intraparenchymal hemorrhage. No mass lesion. No extra-axial collection. No mass effect or midline shift. An 8 mm rounded hyperdensity within the frontal horn of the left lateral ventricle.No hydrocephalus. Basilar cisterns are patent. Vascular: No hyperdense vessel. Atherosclerotic calcifications are present within the cavernous internal carotid arteries. Skull: No acute fracture or focal lesion. Other: Left frontal scalp hematoma formation. CT MAXILLOFACIAL FINDINGS Osseous: Comminuted and displaced 3 mm depressed left orbital floor fracture. Sinuses/Orbits: Blood products partially opacified left maxillary sinus. Paranasal sinuses and mastoid air cells are clear. Bilateral lens replacement. Otherwise the orbits are unremarkable. Soft tissues: Left maxillary subcutaneus soft tissue hematoma formation. Left periorbital subcutaneus soft tissue hematoma formation. CT CERVICAL SPINE FINDINGS Alignment: Normal. Skull base and vertebrae: Multilevel moderate severe degenerative changes of the spine with associated multilevel posterior disc osteophyte complex formation. At least moderate to severe osseous neural foraminal stenosis at the C3-C4 level, right C4 - C5 level. No severe osseous central canal stenosis. No acute fracture. No aggressive appearing focal osseous lesion or focal pathologic process. Soft tissues and spinal canal: No prevertebral fluid or swelling. No visible canal hematoma. Upper chest: Unremarkable. Other: None. IMPRESSION: 1. An 8 mm rounded hyperdensity within the frontal horn of the left lateral ventricle suggestive of blood products and acute  intraventricular hemorrhage. No associated hydrocephalus. Differential diagnosis includes a mass. 2. Comminuted and displaced 3 mm depressed left orbital floor fracture. Correlate with physical exam for extraocular muscle entrapment. 3. No acute displaced fracture or traumatic listhesis of the cervical spine. Electronically Signed: By: Tish Frederickson M.D. On: 04/01/2023 21:55   DG Humerus Left  Result Date: 04/01/2023  CLINICAL DATA:  Fall, swelling, upper arm pain. EXAM: LEFT HUMERUS - 2+ VIEW COMPARISON:  None Available. FINDINGS: There is no evidence of fracture or other focal bone lesions. Superior subluxation of the humeral head abutting the undersurface of the acromion. Elbow alignment is maintained. There is soft tissue edema in the upper lateral arm. IMPRESSION: 1. No fracture of the left humerus. 2. Lateral soft tissue edema. 3. Superior subluxation of the humeral head abutting the undersurface of the acromion consistent with chronic rotator cuff arthropathy. Electronically Signed   By: Narda Rutherford M.D.   On: 04/01/2023 20:47    Pertinent items noted in HPI and remainder of comprehensive ROS otherwise negative. Blood pressure 120/62, pulse 73, resp. rate 18, height 5\' 4"  (1.626 m), weight 80.4 kg, SpO2 96%. Patient is awake and alert.  She is oriented and appropriate.  Her speech is fluent.  Judgment and insight are intact.  Cranial nerve function normal bilateral.  Motor examination 5/5 bilaterally.  No evidence of pronator..  Chest and abdomen are benign.  Extremities are free from injury or deformity.  Examination head ears eyes nose and throat demonstrates evidence of significant bruising and ecchymosis around her left orbital region.  No gross bony abnormality.  Assessment/Plan: Status post syncopal event with resultant fall.  Patient with minimal posttraumatic intraventricular hemorrhage complicated by anticoagulation.  Recommend holding anticoagulation.  I will follow-up head CT scan  in morning.  Okay to mobilize from her standpoint.  Kathaleen Maser Rosette Bellavance 04/02/2023, 9:41 AM

## 2023-04-02 NOTE — Progress Notes (Signed)
PCCM Progress note   Called both ENT and ophthalmology regarding orbital fracture. Both consulting services agree not no inpatient interventions are needed for fracture and she can follow up with ENT one week from discharge.   Reinhard Schack D. Harris, NP-C [facility] Personal contact information can be found on Amion  If no contact or response made please call [phone] 04/02/2023, 3:32 PM

## 2023-04-02 NOTE — H&P (Signed)
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  Per EMS run sheet and ED notes, patient reports that she was standing in the kitchen when she slipped on something and fell hitting her left side on the floor.  Patient states that the next thing she remembers was waking up on the floor.  On EMS arrival patient was alert and oriented x 3 with left periorbital swelling and left arm hematoma.  They also noted ice puddle on the floor suggesting she may have tripped or slipped on ice.  Patient was offered to be transported to tertiary center given history of DVT on Coumadin but she refused insisting to come to [facility].   ED Course: Initial vital signs showed HR of 85 beats/minute, BP 151/72 mm Hg, the RR 18 breaths/minute, and the oxygen saturation 100 % on RA  and a temperature of 97.60F (36.6C).Pertinent Labs/Diagnostics Findings: Na+/ K+: 134/3.5 Glucose: 173 Plts:149 PT/INR:30.8/2.9 CTH showed acute IVH, CT maxillofacial showed comminuted and displaced depressed left orbital floor fracture, CT cervical spine with no acute displaced fractures of the cervical spine. Left arm x-ray with no fractures.    Case discussed with [facility] transfer center and [facility] who are currently reviewing.  Findings discussed with on-call neurosurgeon who recommended close monitoring in the ICU.  However given increase in size of bleed from 8 mm to 11 mm with maxillofacial injury will transfer to tertiary care center.  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 Interval increase in size of small volume intraventricular hemorrhage within the left lateral ventricle, now measuring up to 11 mm. 2. Trace subarachnoid hemorrhage at the left Sylvian fissure. 3. Evolving left periorbital and facial contusion. Left-sided facial fractures described on prior exam.  04/01/23: CTH, CT Cervical Spine>CT Maxillofacial> IMPRESSION: 1. An 8 mm rounded hyperdensity within the frontal horn of the left lateral ventricle suggestive of blood products and acute intraventricular hemorrhage. No associated hydrocephalus. Differential diagnosis includes a mass. 2. Comminuted and displaced 3 mm depressed left orbital floor fracture. Correlate with physical exam for extraocular muscle entrapment. 3. No acute displaced fracture or traumatic listhesis of the cervical spine.  04/01/23: Xray Left Humerus> IMPRESSION: 1. No fracture of the left humerus. 2. Lateral soft tissue edema. 3. Superior subluxation of the humeral head abutting the undersurface of the acromion consistent with chronic rotator cuff arthropathy.  Interim History / Subjective:    -  Micro Data:  None  Antimicrobials:  None  OBJECTIVE  Blood pressure (!) 167/76, pulse 77, temperature 97.8 F (36.6 C), resp. rate (!) 24, height 5\' 6"  (1.676 m), weight 85.2 kg, SpO2 100%.       No intake  or output data in the 24 hours ending 04/02/23 0321 Filed Weights   04/01/23 1959  Weight: 85.2 kg   Physical Examination  GENERAL:82 year-old critically ill patient lying in the bed in no acute distress EYES: PEERLA.  Periorbital hematoma. See below.  No scleral icterus. Extraocular muscles intact.  HEENT: Head traumatic see below, normocephalic. Oropharynx and nasopharynx clear.  NECK:  No JVD, supple  LUNGS: Decreased breath sounds bilaterally. Mild wheezing. No use of accessory muscles of respiration.  CARDIOVASCULAR: S1, S2 normal. No murmurs, rubs, or gallops.  ABDOMEN: Soft, NTND EXTREMITIES: Left Humerus hematoma. See below.   Capillary refill < 3 seconds in all extremities. Pulses palpable distally. NEUROLOGIC: The patient is alert and oriented x 4 . No new or focal neurological deficit appreciated. Cranial nerves are intact.  SKIN: Multiple skin bruising. Ecchymoses. Warm to touch       Labs/imaging that I havepersonally reviewed  (right click and "Reselect all SmartList Selections" daily)     Labs   CBC: Recent Labs  Lab 04/01/23 2002  WBC 6.2  HGB 14.6  HCT 41.9  MCV 93.5  PLT 149*    Basic Metabolic Panel: Recent Labs  Lab 04/01/23 2002  NA 134*  K 3.5  CL 99  CO2 26  GLUCOSE 173*  BUN 18  CREATININE 0.97  CALCIUM 9.0   GFR: Estimated Creatinine Clearance: 48.4 mL/min (by C-G formula based on SCr of 0.97 mg/dL). Recent Labs  Lab 04/01/23 2002  WBC 6.2    Liver Function Tests: No results for input(s): "AST", "ALT", "ALKPHOS", "BILITOT", "PROT", "ALBUMIN" in the last 168 hours. No results for input(s): "LIPASE", "AMYLASE" in the last 168 hours. No results for input(s): "AMMONIA" in the last 168 hours.  ABG No results found for: "PHART", "PCO2ART", "PO2ART", "HCO3", "TCO2", "ACIDBASEDEF", "O2SAT"   Coagulation Profile: Recent Labs  Lab 04/01/23 2002 04/02/23 0039  INR 2.9* 1.3*    Cardiac Enzymes: No results for input(s): "CKTOTAL", "CKMB", "CKMBINDEX", "TROPONINI" in the last 168 hours.  HbA1C: No results found for: "HGBA1C"  CBG: No results for input(s): "GLUCAP" in the last 168 hours.  Review of Systems:   Review of Systems  Constitutional:  Negative.   HENT: Negative.    Eyes:  Positive for pain.  Respiratory:  Positive for shortness of breath and wheezing.   Cardiovascular:  Positive for chest pain.  Gastrointestinal: Negative.   Genitourinary: Negative.   Musculoskeletal:  Positive for back pain, falls, joint pain and neck pain.  Skin: Negative.   Neurological: Negative.   Endo/Heme/Allergies:  Bruises/bleeds easily.  Psychiatric/Behavioral:  Positive for depression. The patient is nervous/anxious.    Past Medical History  She,  has a past medical history of Arthritis, Cancer (HCC), Cataract cortical, senile, COPD (chronic obstructive pulmonary disease) (HCC), Coronary artery disease, Depression, DVT (deep venous thrombosis) (HCC), Edema, Hyperglycemia, Hypertension, Leukopenia, Neuropathy, Shortness of breath dyspnea, Sleep apnea, and Thrombocytopenia (HCC).   Surgical History    Past Surgical History:  Procedure Laterality Date   BACK SURGERY     BREAST BIOPSY Left    benign needle   CHONDROPLASTY Left 05/13/2019   Procedure: CHONDROPLASTY;  Surgeon: Signa Kell, MD;  Location: [facility] ORS;  Service: Orthopedics;  Laterality: Left;   COLONOSCOPY     COLONOSCOPY WITH PROPOFOL N/A 02/24/2017   Procedure: COLONOSCOPY WITH PROPOFOL;  Surgeon: Christena Deem, MD;  Location: [facility] ENDOSCOPY;  Service: Endoscopy;  Laterality: N/A;   CORONARY ANGIOPLASTY     STENT  EYE SURGERY     GAS INSERTION Right 05/06/2015   Procedure: INSERTION OF GAS;  Surgeon: Marcelene Butte, MD;  Location: [facility] ORS;  Service: Ophthalmology;  Laterality: Right;   KNEE ARTHROSCOPY WITH MEDIAL MENISECTOMY Left 05/13/2019   Procedure: KNEE ARTHROSCOPY WITH PARTIAL  MEDIAL and lateral MENISECTOMY;  Surgeon: Signa Kell, MD;  Location: [facility] ORS;  Service: Orthopedics;  Laterality: Left;   MEMBRANE PEEL Right 05/06/2015   Procedure: MEMBRANE PEEL;  Surgeon: Marcelene Butte, MD;  Location: [facility] ORS;  Service: Ophthalmology;  Laterality: Right;   PARS  PLANA VITRECTOMY Right 05/06/2015   Procedure: PARS PLANA VITRECTOMY WITH 23 GAUGE;  Surgeon: Marcelene Butte, MD;  Location: [facility] ORS;  Service: Ophthalmology;  Laterality: Right;     Social History   reports that she has quit smoking. She has never used smokeless tobacco. She reports that she does not drink alcohol and does not use drugs.   Family History   Her family history includes Breast cancer (age of onset: 89) in her sister; Cancer in her mother.   Allergies Allergies  Allergen Reactions   Pneumovax [Pneumococcal Polysaccharide Vaccine] Swelling    Redness     Home Medications  Prior to Admission medications   Medication Sig Start Date End Date Taking? Authorizing Provider  citalopram (CELEXA) 20 MG tablet Take 20 mg by mouth daily.   Yes [provider]  clobetasol ointment (TEMOVATE) 0.05 % Apply 1 Application topically as directed.  APPLY FINGERTIP SIZE AMOUNT TO AFFECTED AREA 2-3 TIMES WEEKLY FOR MAINTENANCE   Yes [provider]  gabapentin (NEURONTIN) 300 MG capsule Take 300 mg by mouth 2 (two) times daily.    Yes [provider]  lisinopril (ZESTRIL) 40 MG tablet Take 40 mg by mouth daily.   Yes [provider]  metoprolol succinate (TOPROL-XL) 25 MG 24 hr tablet Take 12.5 mg by mouth daily.   Yes [provider]  simvastatin (ZOCOR) 20 MG tablet Take 20 mg by mouth at bedtime.    Yes [provider]  warfarin (COUMADIN) 1 MG tablet Take 1 mg by mouth daily.    Yes [provider]  warfarin (COUMADIN) 5 MG tablet Take 5 mg by mouth daily.    Yes [provider]  aspirin EC 81 MG tablet Take 81 mg by mouth daily.    [provider]  Cholecalciferol (VITAMIN D3 PO) Take 1 tablet by mouth daily.    [provider]  enoxaparin (LOVENOX) 40 MG/0.4ML injection Inject 0.4 mLs (40 mg total) into the skin daily for 5 days. 05/13/19 05/18/19  Signa Kell, MD  HYDROcodone-acetaminophen (NORCO)  5-325 MG tablet Take 1-2 tablets by mouth every 4 (four) hours as needed for moderate pain or severe pain. 05/13/19   Signa Kell, MD  hydrocortisone cream 1 % Apply 1 application topically daily as needed for itching.    [provider]  Multiple Vitamin (MULTIVITAMIN) tablet Take 1 tablet by mouth daily.    [provider]  ondansetron (ZOFRAN ODT) 4 MG disintegrating tablet Take 1 tablet (4 mg total) by mouth every 8 (eight) hours as needed for nausea or vomiting. 05/13/19   Signa Kell, MD  Scheduled Meds:  [START ON 04/03/2023]  stroke: early stages of recovery book   Does not apply Once   lidocaine-EPINEPHrine  20 mL Intradermal Once   pantoprazole (PROTONIX) IV  40 mg Intravenous QHS   senna-docusate  1 tablet Oral BID   Continuous Infusions: PRN Meds:.acetaminophen **  OR** acetaminophen (TYLENOL) oral liquid 160 mg/5 mL **OR** acetaminophen, docusate sodium, labetalol **AND** [DISCONTINUED] clevidipine, polyethylene glycol  Active Hospital Problem list   See below  Assessment & Plan:  #Traumatic Intracerebral Hemorrhage CTH shows acute IVH with no hydrocephalus or mass effect -Neuro checks per protocol -Pain control -No HepSQ, antiplatelet or anticoagulant agents at this time.SCDs for now,  -Hold antiplatelets and anticoagulation for now. -IV fluids gentle hydration. -Repeat CT head in 6 hours (or sooner if clinical worsening). -Keep platelets >100k, INR<1.4 -Labetalol 20mg  every 10 minutes as needed if SBP>140. -Replete electrolytes as needed. -Labs: Mg, Phos, lipids, HbA1c, urinalysis. -Normothermia - For temperature >37.5C - acetaminophen 650mg  q4-6 hours PRN. -Relative euglycemia (~ <180) and treat if hyperglycemia (>200 mg/dL)/hypoglycemia (< 60mg /dL).  -Euvolemia - Strict I/Os. -Precautions: Aspiration/seizure/fall. HOB >= 30 degrees with aspiration precautions -Discussed with PCCM on-call at [facility] for  transfer.  Patient [facility] service on  [address] by Dr. Marchelle Gearing.  Pending transfer [facility].  Consult neurosurgery upon patient's arrival  #Traumatic Maxillofacial injury CT maxillofacial shows comminuted and displaced 3 mm depressed left orbital floor fracture -Consult ophthalmologist to evaluate for ocular injury -Hold prophylactic antibiotic for now pending further evaluation  #COPD without evidence of exacerbation #OSA not on CPAP -Supplemental oxygen as needed, maintain SpO2 > 88% -Continue Bronchodilators as needed -Avoid CPAP/BiPAP due to maxillofacial injury  #Left leg DVT on Coumadin #Thrombocytopenia #Supratherapeutic INR s/p reversal with Kcentra and vitamin K -Hold Coumadin -Follow PT/INR -Monitor for signs symptoms of bleeding -Follow-up platelets  #Coronary Artery Disease  #HTN  #HLD  -Hold ASA 81mg  PO daily for now -BP management per [facility] protocol -Continue simvastatin 20mg  PO qhs  #Pre Diabetes -Check Hemoglobin A1c -CBG's q4; Target range of 140 to 180 -SSI -Follow ICU Hypo/Hyperglycemia protocol    Best practice:  Diet:  NPO Pain/Anxiety/Delirium protocol (if indicated): No VAP protocol (if indicated): Not indicated DVT prophylaxis: Contraindicated GI prophylaxis: PPI Glucose control:  SSI Yes Central venous access:  N/A Arterial line:  N/A Foley:  N/A Mobility:  bed rest  PT consulted: N/A Last date of multidisciplinary goals of care discussion [04/02/23] Code Status:  full code Disposition: ICU   = Goals of Care = Code Status Order: FULL  Primary Emergency Contact: Whitmore,KEVIN B, Home Phone: [phone] Wishes to pursue full aggressive treatment and intervention options, including CPR and intubation, but goals of care will be addressed on going with family if that should become necessary.   Critical care time: 45 minutes        Webb Silversmith DNP, CCRN, FNP-C, AGACNP-BC Acute Care & Family Nurse Practitioner [facility] PCCM on call pager  [phone]

## 2023-04-02 NOTE — Evaluation (Signed)
Physical Therapy Evaluation Patient Details Name: Carol Riley MRN: [id number] DOB: 14-Jul-1941 Today's Date: 04/02/2023  History of Present Illness  The pt is an 81 yo female presenting to [facility] on 8/3 after a fall at home. Pt found to have acute IVH of L lateral ventrical which increased from 8mm to 11mm on repeat CT and comminuted and displaced depressed left orbital floor fracture, pt transferred to [facility]. PMH includes: COPD, HTN, OSA, PVD, CAD, cervical cancer, and DVT on anticoagulation.   Clinical Impression  Pt in bed upon arrival of PT, agreeable to evaluation at this time. Prior to admission the pt was independent with use of SPC in the home and yard, reports limited community activity but is independent at home. The pt required minA to complete OOB transfers and at least single UE support. Will attempt with RW next session to see if this improves the pt's independence with transfers and longer distance ambulation. Anticipate the pt will be able to return home with support from her son (who can provide 24/7 assist) once medically stable. Will continue to benefit from skilled PT acutely to progress functional stability and endurance.   BP stable with changes in position this session, SBP from 124 in supine, 117 sitting EOB, 104 standing, and 136 after standing 3 min with MAP >70 throughout.     If plan is discharge home, recommend the following: A little help with walking and/or transfers;A little help with bathing/dressing/bathroom;Assistance with cooking/housework;Assist for transportation;Help with stairs or ramp for entrance   Can travel by private vehicle        Equipment Recommendations Rolling walker (2 wheels)  Recommendations for Other Services       Functional Status Assessment Patient has had a recent decline in their functional status and demonstrates the ability to make significant improvements in function in a reasonable and predictable amount of time.      Precautions / Restrictions Precautions Precautions: Fall Precaution Comments: admitted after fall Restrictions Weight Bearing Restrictions: No      Mobility  Bed Mobility Overal bed mobility: Needs Assistance Bed Mobility: Supine to Sit     Supine to sit: Supervision     General bed mobility comments: supervision with HOB elevated and increased time    Transfers Overall transfer level: Needs assistance Equipment used: 1 person hand held assist Transfers: Sit to/from Stand Sit to Stand: Min assist           General transfer comment: mina to rise and steady, improved within session.    Ambulation/Gait Ambulation/Gait assistance: Min assist, Mod assist Gait Distance (Feet): 10 Feet (+ 10 ft) Assistive device: 1 person hand held assist, IV Pole Gait Pattern/deviations: Step-to pattern, Decreased stride length Gait velocity: decreased Gait velocity interpretation: <1.31 ft/sec, indicative of household ambulator   General Gait Details: pt with slow, guarded steps, reaching for UE support. able to use HHA or IV pole, would benefit from RW. no overt LOB but increased sway. Pt reports no use of O2 with mobility at home, increased SOB and SpO2 to 90%, placed back on 2L O2  Modified Rankin (Stroke Patients Only) Modified Rankin (Stroke Patients Only) Pre-Morbid Rankin Score: No symptoms Modified Rankin: Moderately severe disability     Balance Overall balance assessment: Needs assistance Sitting-balance support: No upper extremity supported, Feet supported Sitting balance-Leahy Scale: Good     Standing balance support: Single extremity supported, During functional activity Standing balance-Leahy Scale: Fair  Pertinent Vitals/Pain Pain Assessment Pain Assessment: Faces Faces Pain Scale: Hurts a little bit Pain Location: headache Pain Descriptors / Indicators: Headache Pain Intervention(s): Monitored during session     Home Living Family/patient expects to be discharged to:: Private residence Living Arrangements: Alone Available Help at Discharge: Family;Available 24 hours/day (son lives "within walking distance") Type of Home: House Home Access: Stairs to enter Entrance Stairs-Rails: None Entrance Stairs-Number of Steps: 2 at front, 8 at back   Home Layout: One level Home Equipment: Cane - quad;Cane - single point;Shower seat;Grab bars - toilet;Grab bars - tub/shower;Hand held Programmer, systems (2 wheels) Additional Comments: son has offered to move in, pt wants to remain independent    Prior Function Prior Level of Function : Independent/Modified Independent             Mobility Comments: use of cane, no other falls ADLs Comments: pt reports independence with ADLs, and IADLs, has valid drivers license but no car     Hand Dominance   Dominant Hand: Right    Extremity/Trunk Assessment   Upper Extremity Assessment Upper Extremity Assessment: Overall WFL for tasks assessed    Lower Extremity Assessment Lower Extremity Assessment: Generalized weakness (poor endurance, good strength to MMT, gross 4/5)    Cervical / Trunk Assessment Cervical / Trunk Assessment: Normal  Communication   Communication: No difficulties  Cognition Arousal/Alertness: Awake/alert Behavior During Therapy: WFL for tasks assessed/performed Overall Cognitive Status: Within Functional Limits for tasks assessed                                          General Comments General comments (skin integrity, edema, etc.): SpO2 to 90% on RA with pt SOB, increased to 98% on 2L. orthostatic BP negative, SBP from 124-117-104-136        Assessment/Plan    PT Assessment Patient needs continued PT services  PT Problem List Decreased activity tolerance;Decreased mobility;Decreased balance;Decreased coordination       PT Treatment Interventions DME instruction;Gait training;Stair  training;Functional mobility training;Therapeutic activities;Balance training;Therapeutic exercise;Neuromuscular re-education;Patient/family education    PT Goals (Current goals can be found in the Care Plan section)  Acute Rehab PT Goals Patient Stated Goal: return home PT Goal Formulation: With patient Time For Goal Achievement: 04/16/23 Potential to Achieve Goals: Good    Frequency Min 1X/week        AM-PAC PT "6 Clicks" Mobility  Outcome Measure Help needed turning from your back to your side while in a flat bed without using bedrails?: A Little Help needed moving from lying on your back to sitting on the side of a flat bed without using bedrails?: A Little Help needed moving to and from a bed to a chair (including a wheelchair)?: A Little Help needed standing up from a chair using your arms (e.g., wheelchair or bedside chair)?: A Little Help needed to walk in hospital room?: A Lot Help needed climbing 3-5 steps with a railing? : Total 6 Click Score: 15    End of Session Equipment Utilized During Treatment: Gait belt;Oxygen Activity Tolerance: Patient tolerated treatment well Patient left: in chair;with call bell/phone within reach;with chair alarm set Nurse Communication: Mobility status PT Visit Diagnosis: Other abnormalities of gait and mobility (R26.89);Unsteadiness on feet (R26.81)    Time: 1012-1050 PT Time Calculation (min) (ACUTE ONLY): 38 min   Charges:   PT Evaluation $PT Eval Low Complexity: 1 Low  PT Treatments $Gait Training: 8-22 mins $Therapeutic Activity: 8-22 mins PT General Charges $$ ACUTE PT VISIT: 1 Visit         Vickki Muff, PT, DPT   Acute Rehabilitation Department Office [phone] Secure Chat Communication Preferred  Ronnie Derby 04/02/2023, 12:57 PM

## 2023-04-02 NOTE — ED Notes (Signed)
Attempt to call report to 4N at [facility]. Request that this author calls back in 10 minutes. Will reattempt.

## 2023-04-02 NOTE — H&P (Signed)
NAME:  Carol Riley, MRN:  [id number], DOB:  Feb 21, 1941, LOS: 0 ADMISSION DATE:  04/02/2023, CONSULTATION DATE:  04/02/23 REFERRING MD:  Pryor Montes CCM NP, CHIEF COMPLAINT:  IVH   History of Present Illness:  82 yo f PMH DVT on coumadin, COPD, CAD who was BIB EMS to [facility] 8/3 after fall at home. Last she remembers she was in the kitchen, has no recollection of fall or immediate preceding events. Awoke on the floor. With active AC, fall with + LOC and obvious facial bruising EMS recommended bypassing [facility] for [facility]. Pt refused this and presented to [facility] where CT H revealed 8mm IVH and CT max revealed a displaced L orbital floor fx. Given Kcentra.   It looks like there were lots of calls related to dispo/transfer -- [facility] and [facility], as well as [facility] trauma, before admitting to [facility]CM at [facility] and discussion w on call NSGY. Repeat CT H was obtained and revealed incr in size of IVH to 11mm. EM d/w NSGY who was comfortable continuing care at [facility]. It sounds like decision for transfer is related to possible ophtho needs, not NSGY.  Pt transferred to [facility] ICU in this setting.   Pertinent  Medical History  DVT Chronic AC CAD  COPD  HTN  Significant Hospital Events: Including procedures, antibiotic start and stop dates in addition to other pertinent events   8/3 [facility] after fall at home. 8mm IVH and L  orbital floor fx. Given Kcentra 8/4 admit to [facility] but then transferred to [facility] ICU-- had expansion in IVH to 11mm   Interim History / Subjective:  Arrives to [facility] ICU   [facility] as [facility]   Objective   Blood pressure (!) 149/63, pulse 77, resp. rate 20, height 5\' 4"  (1.626 m), weight 80.4 kg, SpO2 98%.       No intake or output data in the 24 hours ending 04/02/23 0639 Filed Weights   04/02/23 0551  Weight: 80.4 kg    Examination: General: WDWN obese elderly F  HENT: L periorbital edema and ecchymosis. 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 Initial IVF 8mm, incr to 11mm on repeat CT at [facility] P -consult NSGY this morning.  -repeat CT H -SBP < 140 -- will order for PRN metop in addition to her home meds  -holding AC and antiplts  -q1hr neuro checks  -PT/OT   COPD P -IS, mobility  -PRN duoneb -wean O2, goal 88-92   HTN CAD HLD P -holding antiplt  -cont home antihypertensives and crestor   Hx DVT -chronic coumadin P -holding AC   Hypomagnesemia P -replace  DNR status -in paper chart there is a DNR form, at [facility] it looks like there was a conversation specific to code status and pt indicated full code. -for [place], both her and son are indicating DNR  -will change code status back to DNR   Best Practice (right click and "Reselect all SmartList Selections" daily)   Diet/type: NPO w/ oral meds DVT prophylaxis: SCD GI prophylaxis: N/A Lines:  N/A Foley:  N/A Code Status:  DNR Last date of multidisciplinary goals of care discussion [--]  Labs   CBC: Recent Labs  Lab 04/01/23 2002 04/02/23 0459  WBC 6.2 7.1  HGB 14.6 14.3  HCT 41.9 41.1  MCV 93.5 93.0  PLT 149* 147*    Basic Metabolic Panel: Recent Labs  Lab 04/01/23 2002 04/02/23 0459  NA 134* 136  K 3.5 3.5  CL 99 99  CO2 26 29  GLUCOSE 173* 155*  BUN 18 16  CREATININE 0.97 0.90  CALCIUM 9.0 9.0  MG  --  1.5*  PHOS  --  3.3   GFR: Estimated Creatinine Clearance: 48.6 mL/min (by C-G formula based on SCr of 0.9 mg/dL). Recent Labs  Lab 04/01/23 2002 04/02/23 0459  WBC 6.2 7.1    Liver Function Tests: No results for input(s): "AST", "ALT", "ALKPHOS", "BILITOT", "PROT", "ALBUMIN" in the  last 168 hours. No results for input(s): "LIPASE", "AMYLASE" in the last 168 hours. No results for input(s): "AMMONIA" in the last 168 hours.  ABG No results found for: "PHART", "PCO2ART", "PO2ART", "HCO3", "TCO2", "ACIDBASEDEF", "O2SAT"   Coagulation Profile: Recent Labs  Lab 04/01/23 2002 04/02/23 0039 04/02/23 0459  INR 2.9* 1.3* 1.2    Cardiac Enzymes: No results for input(s): "CKTOTAL", "CKMB", "CKMBINDEX", "TROPONINI" in the last 168 hours.  HbA1C: No results found for: "HGBA1C"  CBG: No results for input(s): "GLUCAP" in the last 168 hours.  Review of Systems:   Review of Systems  Constitutional: Negative.   HENT: Negative.    Eyes:  Positive for redness. Negative for blurred vision, double vision, photophobia and pain.  Respiratory: Negative.    Cardiovascular: Negative.   Gastrointestinal: Negative.   Genitourinary: Negative.   Musculoskeletal: Negative.   Skin: Negative.   Neurological:  Positive for loss of consciousness. Negative for dizziness, tingling, tremors, sensory change, speech change, focal weakness, weakness and headaches.  Endo/Heme/Allergies:  Bruises/bleeds easily.  Psychiatric/Behavioral:  Positive for memory loss.      Past Medical History:  She,  has a past medical history of Arthritis, Cancer (HCC), Cataract cortical, senile, COPD (chronic obstructive pulmonary disease) (HCC), Coronary artery disease, Depression, DVT (deep venous thrombosis) (HCC), Edema, Hyperglycemia, Hypertension, Leukopenia, Neuropathy, Shortness of breath dyspnea, Sleep apnea, and Thrombocytopenia (HCC).   Surgical History:   Past Surgical History:  Procedure Laterality Date   BACK SURGERY     BREAST BIOPSY Left    benign needle   CHONDROPLASTY Left 05/13/2019   Procedure: CHONDROPLASTY;  Surgeon: Signa Kell, MD;  Location: [facility] ORS;  Service: Orthopedics;  Laterality: Left;   COLONOSCOPY     COLONOSCOPY WITH PROPOFOL N/A 02/24/2017   Procedure: COLONOSCOPY WITH  PROPOFOL;  Surgeon: Christena Deem, MD;  Location: [facility] ENDOSCOPY;  Service: Endoscopy;  Laterality: N/A;   CORONARY ANGIOPLASTY     STENT   EYE SURGERY     GAS INSERTION Right 05/06/2015   Procedure: INSERTION OF GAS;  Surgeon: Marcelene Butte, MD;  Location: [facility] ORS;  Service: Ophthalmology;  Laterality: Right;   KNEE ARTHROSCOPY WITH MEDIAL MENISECTOMY Left 05/13/2019   Procedure: KNEE ARTHROSCOPY WITH PARTIAL  MEDIAL and lateral MENISECTOMY;  Surgeon: Signa Kell, MD;  Location: [facility] ORS;  Service: Orthopedics;  Laterality: Left;   MEMBRANE PEEL Right 05/06/2015   Procedure: MEMBRANE PEEL;  Surgeon: Marcelene Butte, MD;  Location: [facility] ORS;  Service: Ophthalmology;  Laterality: Right;   PARS PLANA VITRECTOMY Right 05/06/2015   Procedure: PARS PLANA VITRECTOMY WITH 23  GAUGE;  Surgeon: Marcelene Butte, MD;  Location: [facility] ORS;  Service: Ophthalmology;  Laterality: Right;     Social History:   reports that she has quit smoking. She has never used smokeless tobacco. She reports that she does not drink alcohol and does not use drugs.   Family History:  Her family history includes Breast cancer (age of onset: 69) in her sister; Cancer in her mother.   Allergies Allergies  Allergen Reactions   Pneumovax [Pneumococcal Polysaccharide Vaccine] Swelling    Redness     Home Medications  Prior to Admission medications   Medication Sig Start Date End Date Taking? Authorizing Provider  aspirin EC 81 MG tablet Take 81 mg by mouth daily.    [provider]  Cholecalciferol (VITAMIN D3 PO) Take 1 tablet by mouth daily.    [provider]  citalopram (CELEXA) 20 MG tablet Take 20 mg by mouth daily.    [provider]  clobetasol ointment (TEMOVATE) 0.05 % Apply 1 Application topically as directed.  APPLY FINGERTIP SIZE AMOUNT TO AFFECTED AREA 2-3 TIMES WEEKLY FOR MAINTENANCE    [provider]  gabapentin (NEURONTIN) 300 MG capsule Take 300 mg by mouth 2 (two)  times daily.     [provider]  hydrocortisone cream 1 % Apply 1 application topically daily as needed for itching.    [provider]  lisinopril (ZESTRIL) 40 MG tablet Take 40 mg by mouth daily.    [provider]  metoprolol succinate (TOPROL-XL) 25 MG 24 hr tablet Take 12.5 mg by mouth daily.    [provider]  Multiple Vitamin (MULTIVITAMIN) tablet Take 1 tablet by mouth daily.    [provider]  simvastatin (ZOCOR) 20 MG tablet Take 20 mg by mouth at bedtime.     [provider]     Critical care time: 31       CRITICAL CARE Performed by: Lanier Clam   Total critical care time: 40 minutes  Critical care time was exclusive of separately billable procedures and treating other patients. Critical care was necessary to treat or prevent imminent or life-threatening deterioration.  Critical care was time spent personally by me on the following activities: development of treatment plan with patient and/or surrogate as well as nursing, discussions with consultants, evaluation of patient's response to treatment, examination of patient, obtaining history from patient or surrogate, ordering and performing treatments and interventions, ordering and review of laboratory studies, ordering and review of radiographic studies, pulse oximetry and re-evaluation of patient's condition.  Tessie Fass MSN, AGACNP-BC [facility] Pulmonary/Critical Care Medicine Amion for pager  04/02/2023, 6:39 AM

## 2023-04-02 NOTE — Progress Notes (Signed)
eLink Physician-Brief Progress Note Patient Name: KHAYLA KOPPENHAVER DOB: 1940/09/28 MRN: [id number]   Date of Service  04/02/2023  HPI/Events of Note  52F with COPD on oxygen, HTN, OSA, LLE DVT on coumadin, PVD, thrombocytopenia, CAD, hx cervical cancer who p/w fall resulting in left periorbital swelling and left arm hematoma.  CT head/maxillofacial/cervical spine: 8 mm rounded hyperdensity within the frontal horn of the left lateral ventricle suggestive of blood products and acute intraventricular hemorrhage, no hydrocephalus. Comminuted and displaced 3 mm depressed left orbital floor Fracture  Repeat CT with increase from 8 mm to 11 mm. CT  Transferred to [facility] for NSY evaluation and patient preference for tertiary center. PCCM accepted to ICU.  Admission INR 2.9. Unclear what blood products received. Current INR 1.2  Plt 147  No pressors. No vent needs  eICU Interventions  Neuro checks q1 Repeat CT head this am SBP goal <140 Holding antiplatelets/AC Remain NPO     Intervention Category Evaluation Type: New Patient Evaluation  Karla Vines Mechele Collin 04/02/2023, 5:48 AM

## 2023-04-02 NOTE — ED Provider Notes (Signed)
-----------------------------------------   12:36 AM on 04/02/2023 -----------------------------------------   Transfer center called to update that trauma team has not yet called back.  Updated patient and her granddaughter of delay.  They are agreeable with calling [facility] for transfer.  Remains alert and oriented.   ----------------------------------------- 12:48 AM on 04/02/2023 -----------------------------------------   Spoke with [facility] transfer center who discussed case with ED attending who recommends neurosurgery to call back.   ----------------------------------------- 1:00 AM on 04/02/2023 -----------------------------------------   Spoke with neurosurgery at [facility] who reviewed patient's images.  He is not convinced it is a true bleed.  Recommends repeat CT or MRI and admitting patient to our facility especially since she is lucid and stable.   ----------------------------------------- 1:24 AM on 04/02/2023 -----------------------------------------   Discussed with Dr. Katrinka Blazing, on-call neurosurgery at our facility who is agreeable with plan for repeat imaging and admission to CCU for observation.  He will evaluate patient in the morning.  Recommends CT scan over MRI.  I have updated patient and her family members who are agreeable with plan of care.   ----------------------------------------- 2:46 AM on 04/02/2023 -----------------------------------------   Spoke again with Dr. Katrinka Blazing regarding interval increase in size of bleed from 8 mm to 11 mm; he is comfortable admitting patient to our facility and will see patient in the a.m.  Will discuss with CCU intensivist O[facility] for evaluation and admission.   Irean Hong, MD 04/02/23 [phone]

## 2023-04-02 NOTE — Progress Notes (Addendum)
MEDICATION RELATED CONSULT NOTE - INITIAL   Pharmacy Consult for K-Centra Indication: Warfarin reversal   Allergies  Allergen Reactions   Pneumovax [Pneumococcal Polysaccharide Vaccine] Swelling    Redness    Patient Measurements: Height: 5\' 6"  (167.6 cm) Weight: 85.2 kg (187 lb 13.3 oz) IBW/kg (Calculated) : 59.3 Adjusted Body Weight:   Vital Signs: Temp: 97.8 F (36.6 C) (08/03 1958) BP: 157/58 (08/03 2330) Pulse Rate: 84 (08/03 2345) Intake/Output from previous day: No intake/output data recorded. Intake/Output from this shift: No intake/output data recorded.  Labs: Recent Labs    04/01/23 2002  WBC 6.2  HGB 14.6  HCT 41.9  PLT 149*  CREATININE 0.97   Estimated Creatinine Clearance: 48.4 mL/min (by C-G formula based on SCr of 0.97 mg/dL).   Microbiology: No results found for this or any previous visit (from the past 720 hour(s)).  Medical History: Past Medical History:  Diagnosis Date   Arthritis    Cancer (HCC)    cervical cancer   Cataract cortical, senile    COPD (chronic obstructive pulmonary disease) (HCC)    Coronary artery disease    Depression    DVT (deep venous thrombosis) (HCC)    Edema    FEET/LEGS   Hyperglycemia    Hypertension    Leukopenia    Neuropathy    Shortness of breath dyspnea    Sleep apnea    Thrombocytopenia (HCC)     Medications:  (Not in a hospital admission)   Assessment: Pharmacy consulted to dose/monitor K-Centra in this 82 year old female on warfarin PTA for DVT prophylaxis who presents to ED following fall, head trauma.  Pt was apparently started on warfarin in 2020 for DVT.   Radiologist report notes acute intracranial hemorrhaging.     8/3:  INR @ 2002 = 2.9, therapeutic   KCentra 1500 mg IV X 1 given @ 2303. Vit K 10 mg IVPB X 1 given on 8/4 @ 0002  8/4:  INR @ 0039 = 1.3   Pt was on warfarin 6 mg PO daily PTA, unsure of last dose.  Goal of Therapy:  Reversal of warfarin , INR < 1.5  Plan:   KCentra 1500 mg IV X 1 given on 8/3 @ 2303. Vit K 10 mg IVPB X 1 given on 8/4 @ 0002.  - Will check INR 60 min following K-Centra then daily X 2. - Need to f/u plans for anticoag if cleared to restart by medical staff.  May be refractory to anticoag from warfarin following IV Vit K.    Lanessa Shill D 04/02/2023,12:54 AM

## 2023-04-02 NOTE — Progress Notes (Addendum)
PCCM progress note  Patient admitted early a.m. with traumatic IVH on chronic anticoagulation with left orbital floor fracture.  See full H&P for further details.  Overall this a.m. remains stable with intact neuroexam.  Repeat head CT planned for today at noon. Neurosurgery has been consulted and assessment pending.  Will continue n.p.o. until evaluated by neurosurgery.  No signs of optical entrapment but given orbital floor fracture will consult ophthalmology to ensure no additional interventions needed and set up outpatient follow-up.  Per chart review and review of care everywhere records it appears patient has a history of DVT with COVID-pneumonia and inhibitors of clotting factors (elevated PTT) resulting in need for lifelong anticoagulation.  Will coordinate with neurosurgery regarding timing for resumption of anticoagulation.  On further discussion with it appears fall was related to syncopal episode.  Patient states she does not recall falling or any prodromal symptoms prior to waking up on the floor.  Will begin syncopal workup.   30 mins additional critical care time   Danta Baumgardner D. Harris, NP-C [facility] Personal contact information can be found on Amion  If no contact or response made please call [phone] 04/02/2023, 9:53 AM

## 2023-04-02 NOTE — Progress Notes (Addendum)
eLink Physician-Brief Progress Note Patient Name: MERISSA RENWICK DOB: Jan 12, 1941 MRN: [id number]   Date of Service  04/02/2023  HPI/Events of Note  Bedside RN reports systolic blood pressure greater than 140 even though goal is a systolic blood pressure of less than 140.  Current blood pressure is 142/58.  eICU Interventions  Recheck blood pressure is 136/60.  No intervention planned. Discussed with bedside RN     Intervention Category Major Interventions: Hypertension - evaluation and management  Carilyn Goodpasture 04/02/2023, 11:04 PM

## 2023-04-03 ENCOUNTER — Inpatient Hospital Stay (HOSPITAL_COMMUNITY): Payer: Medicare HMO

## 2023-04-03 DIAGNOSIS — R55 Syncope and collapse: Secondary | ICD-10-CM | POA: Diagnosis not present

## 2023-04-03 DIAGNOSIS — I615 Nontraumatic intracerebral hemorrhage, intraventricular: Secondary | ICD-10-CM | POA: Diagnosis not present

## 2023-04-03 LAB — PROTIME-INR
INR: 1 (ref 0.8–1.2)
Prothrombin Time: 13.7 seconds (ref 11.4–15.2)

## 2023-04-03 LAB — MAGNESIUM: Magnesium: 1.8 mg/dL (ref 1.7–2.4)

## 2023-04-03 MED ORDER — POTASSIUM CHLORIDE 10 MEQ/100ML IV SOLN
10.0000 meq | INTRAVENOUS | Status: DC
  Administered 2023-04-03: 10 meq via INTRAVENOUS
  Filled 2023-04-03: qty 100

## 2023-04-03 MED ORDER — POTASSIUM CHLORIDE CRYS ER 20 MEQ PO TBCR
20.0000 meq | EXTENDED_RELEASE_TABLET | ORAL | Status: AC
  Administered 2023-04-03 (×2): 20 meq via ORAL
  Filled 2023-04-03 (×2): qty 1

## 2023-04-03 MED ORDER — POTASSIUM CHLORIDE 20 MEQ PO PACK
40.0000 meq | PACK | Freq: Once | ORAL | Status: AC
  Administered 2023-04-03: 40 meq via ORAL
  Filled 2023-04-03: qty 2

## 2023-04-03 MED ORDER — MAGNESIUM SULFATE 2 GM/50ML IV SOLN
2.0000 g | Freq: Once | INTRAVENOUS | Status: AC
  Administered 2023-04-03: 2 g via INTRAVENOUS
  Filled 2023-04-03: qty 50

## 2023-04-03 NOTE — Progress Notes (Signed)
Physical Therapy Treatment Patient Details Name: Carol Riley MRN: [id number] DOB: Jun 09, 1941 Today's Date: 04/03/2023   History of Present Illness The pt is an 82 yo female presenting to [place] on 8/3 after a fall at home. Pt found to have acute IVH of L lateral ventrical which increased from 8mm to 11mm on repeat CT and comminuted and displaced depressed left orbital floor fracture, pt transferred to [facility]. PMH includes: COPD, HTN, OSA, PVD, CAD, cervical cancer, and DVT on anticoagulation.    PT Comments  Pt progressing well towards her physical therapy goals, exhibiting improved activity tolerance and ambulation distance. Pt ambulating 150 ft with a walker at a min guard assist level. SpO2 >/= 90% on RA. Displays decreased cardiopulmonary endurance and impaired balance. Will benefit from HHPT at d/c.    If plan is discharge home, recommend the following: A little help with walking and/or transfers;A little help with bathing/dressing/bathroom;Assistance with cooking/housework;Assist for transportation;Help with stairs or ramp for entrance   Can travel by private vehicle        Equipment Recommendations  Rolling walker (2 wheels)    Recommendations for Other Services       Precautions / Restrictions Precautions Precautions: Fall Precaution Comments: admitted after fall Restrictions Weight Bearing Restrictions: No     Mobility  Bed Mobility Overal bed mobility: Needs Assistance Bed Mobility: Supine to Sit     Supine to sit: Supervision     General bed mobility comments: supervision with HOB elevated and increased time    Transfers Overall transfer level: Needs assistance Equipment used: Rolling walker (2 wheels) Transfers: Sit to/from Stand Sit to Stand: Supervision           General transfer comment: Cues for hand placement    Ambulation/Gait Ambulation/Gait assistance: Min guard Gait Distance (Feet): 150 Feet Assistive device: Rolling walker (2  wheels) Gait Pattern/deviations: Decreased stride length, Step-through pattern Gait velocity: decreased     General Gait Details: Slow and steady pace, min cues for environmental negotiation, min guard for balance   Stairs             Wheelchair Mobility     Tilt Bed    Modified Rankin (Stroke Patients Only) Modified Rankin (Stroke Patients Only) Pre-Morbid Rankin Score: No symptoms Modified Rankin: Moderately severe disability     Balance Overall balance assessment: Needs assistance Sitting-balance support: No upper extremity supported, Feet supported Sitting balance-Leahy Scale: Good     Standing balance support: Single extremity supported, During functional activity Standing balance-Leahy Scale: Fair                              Cognition Arousal/Alertness: Awake/alert Behavior During Therapy: WFL for tasks assessed/performed Overall Cognitive Status: Within Functional Limits for tasks assessed                                          Exercises      General Comments        Pertinent Vitals/Pain Pain Assessment Pain Assessment: No/denies pain    Home Living                          Prior Function            PT Goals (current goals can now be found in the care  plan section) Acute Rehab PT Goals Patient Stated Goal: return home PT Goal Formulation: With patient Time For Goal Achievement: 04/16/23 Potential to Achieve Goals: Good Progress towards PT goals: Progressing toward goals    Frequency    Min 1X/week      PT Plan Current plan remains appropriate    Co-evaluation              AM-PAC PT "6 Clicks" Mobility   Outcome Measure  Help needed turning from your back to your side while in a flat bed without using bedrails?: A Little Help needed moving from lying on your back to sitting on the side of a flat bed without using bedrails?: A Little Help needed moving to and from a bed to a  chair (including a wheelchair)?: A Little Help needed standing up from a chair using your arms (e.g., wheelchair or bedside chair)?: A Little Help needed to walk in hospital room?: A Little Help needed climbing 3-5 steps with a railing? : A Lot 6 Click Score: 17    End of Session Equipment Utilized During Treatment: Gait belt Activity Tolerance: Patient tolerated treatment well Patient left: with call bell/phone within reach;in bed;Other (comment) (for ultrasound) Nurse Communication: Mobility status PT Visit Diagnosis: Other abnormalities of gait and mobility (R26.89);Unsteadiness on feet (R26.81)     Time: 7829-5621 PT Time Calculation (min) (ACUTE ONLY): 17 min  Charges:    $Therapeutic Activity: 8-22 mins PT General Charges $$ ACUTE PT VISIT: 1 Visit                     Lillia Pauls, PT, DPT Acute Rehabilitation Services Office [phone]    Norval Morton 04/03/2023, 10:36 AM

## 2023-04-03 NOTE — TOC CAGE-AID Note (Signed)
Transition of Care [facility]) - CAGE-AID Screening   Patient Details  Name: Carol Riley MRN: [id number] Date of Birth: 1941-07-17   Hewitt Shorts, RN Trauma Response Nurse Phone Number: [phone] 04/03/2023, 9:12 AM   Clinical Narrative:  Pt admitted as a transfer from [facility] with posttraumatic IVH, denies any alcohol /drug use. Former smoker.   CAGE-AID Screening:    Have You Ever Felt You Ought to Cut Down on Your Drinking or Drug Use?: No Have People Annoyed You By Critizing Your Drinking Or Drug Use?: No Have You Felt Bad Or Guilty About Your Drinking Or Drug Use?: No Have You Ever Had a Drink or Used Drugs First Thing In The Morning to Steady Your Nerves or to Get Rid of a Hangover?: No CAGE-AID Score: 0  Substance Abuse Education Offered: No (denies etoh/drug use)

## 2023-04-03 NOTE — Progress Notes (Addendum)
NAME:  Carol Riley, MRN:  [id number], DOB:  August 06, 1941, LOS: 1 ADMISSION DATE:  04/02/2023, CONSULTATION DATE:  04/02/23 REFERRING MD:  Pryor Montes CCM NP, CHIEF COMPLAINT:  IVH   History of Present Illness:  82 yo f PMH DVT on coumadin, COPD, CAD who was BIB EMS to [facility] 8/3 after fall at home. Last she remembers she was in the kitchen, has no recollection of fall or immediate preceding events. Awoke on the floor. With active AC, fall with + LOC and obvious facial bruising EMS recommended bypassing [facility] for [facility]. Pt refused this and presented to [facility] where CT H revealed 8mm IVH and CT max revealed a displaced L orbital floor fx. Given Kcentra.   It looks like there were lots of calls related to dispo/transfer -- [facility], as well as [facility] trauma, before admitting to CCM at [facility] and discussion w on call NSGY. Repeat CT H was obtained and revealed incr in size of IVH to 11mm. EM d/w NSGY who was comfortable continuing care at [facility]. It sounds like decision for transfer is related to possible ophtho needs, not NSGY.  Pt transferred to [facility] ICU in this setting.   Pertinent  Medical History  DVT Chronic AC CAD  COPD  HTN  Significant Hospital Events: Including procedures, antibiotic start and stop dates in addition to other pertinent events   8/3 [facility] after fall at home. 8mm IVH and L  orbital floor fx. Given Kcentra 8/4 admit to [facility] but then transferred to [facility] ICU-- had expansion in IVH to 11mm  04/03/2023 Continued interval increase in volume of intraventricular hemorrhage. No Hydrocephalus, trace residual subarachnoid hemorrhage, left Sylvian fissure. INR pending   Interim History / Subjective:  Pt,. Is awake and alert. She is oriented x 3 and conversational appropriate, MAE x 4.  Per the repeat Ct head done done 04/03/23 at midnight, there has been continued increase in volume of intraventricular hemorrhage, now with increased blood involving left >right ventricles. Ventricular size not  significantly changed , no hydrocephalus.  Trace residual subarachnoid hemorrhage.    Objective   Blood pressure (!) 147/94, pulse 86, temperature 98.9 F (37.2 C), temperature source Axillary, resp. rate (!) 21, height 5\' 4"  (1.626 m), weight 77.3 kg, SpO2 93%.       No intake or output data in the 24 hours ending 04/03/23 0807 Filed Weights   04/02/23 0551 04/03/23 0500  Weight: 80.4 kg 77.3 kg    Examination: General: WDWN obese elderly F , supine in bed on nasal oxygen, NAD HENT: L periorbital edema and ecchymosis. subconjunctival hemorrhage, No LAD Lungs: Bilateral chest excursion , even unlabored respirations, slightly diminished per bases Cardiovascular: S1, S2, rrr, No RMG Abdomen: soft round , ND, BS +, Body mass index is 29.25 kg/m. , pt with BM 8/5/am Extremities: no acute joint deformity , Bruising to L side and face Neuro: AAOx4 5/5 strength BUE BLE PERRLA 3mm  GU: defer  Labs reviewed 04/03/2023 HGB 13.3, WBC 6.2, Platelets 147 Na 135, K 3.3, Cl 92,  CO2 27, Creatinine 1.06, GFR 52, Gap 16 Mag was low at 1.3 on  8/4, received 2 grams, will recheck level this am  No INR >> will draw now  Resolved Hospital Problem list     Assessment & Plan:   Fall with + LOC  Traumatic IVH in pt on chronic AC -- 11mm  L orbital floor fx in setting of above  -rcvd Kcentra in ED.  Initial IVF 8mm, incr to 11mm on repeat CT at [facility] 8/5 CT Head shows Continued interval increase in volume of intraventricular hemorrhage. No Hydrocephalus, Trace SubDural P -Appreciate neurosurgery assist.  -repeat CT H results as above - Will need re-imaging , most likely within next 24 hours or with any neuro change -SBP < 140 -- will order for PRN metoprolol in addition to her home meds  -continue holding AC and antiplts  -q1hr neuro checks  -PT/OT  - Will check INR now -Will remain in ICU until CT Head is stable  COPD P -IS, mobility  -PRN duoneb -wean O2, goal 88-92    HTN CAD HLD P -holding antiplt  -cont home antihypertensives and crestor  -Goal as noted above at < 140 systolic  Hx DVT -chronic coumadin P -holding AC  -PAS hose   Hypomagnesemia P -replaced 8/4 - Will check Mag today ( 8/5) to ensure > 2  Hypokalemia P - Repletion orders written  - Will repeat K at 1400 on 8/5 to see if additional needed  DNR status -in paper chart there is a DNR form, at [facility] it looks like there was a conversation specific to code status and pt indicated full code. -for [place], both her and son are indicating DNR  -will change code status back to DNR   Will need OP follow up within 1 week after discharge with ENT regarding orbital fracture. ( Per ENT and Ophthalmology recommendations , see note 8/4 at 3:30 pm)   Best Practice (right click and "Reselect all SmartList Selections" daily)   Diet/type: Heart DVT prophylaxis: SCD GI prophylaxis: N/A Lines: N/A Foley:  N/A Code Status:  DNR Last date of multidisciplinary goals of care discussion [Updated patient on  CT Head results 8/5/am)]  Labs   CBC: Recent Labs  Lab 04/01/23 2002 04/02/23 0459 04/03/23 0237  WBC 6.2 7.1 6.2  HGB 14.6 14.3 13.3  HCT 41.9 41.1 38.4  MCV 93.5 93.0 92.1  PLT 149* 147* 147*    Basic Metabolic Panel: Recent Labs  Lab 04/01/23 2002 04/02/23 0459 04/02/23 0615 04/03/23 0237  NA 134* 136  --  135  K 3.5 3.5  --  3.3*  CL 99 99  --  92*  CO2 26 29  --  27  GLUCOSE 173* 155*  --  150*  BUN 18 16  --  13  CREATININE 0.97 0.90  --  1.06*  CALCIUM 9.0 9.0  --  9.1  MG  --  1.5* 1.3*  --   PHOS  --  3.3  --   --    GFR: Estimated Creatinine Clearance: 40.4 mL/min (A) (by C-G formula based on SCr of 1.06 mg/dL (H)). Recent Labs  Lab 04/01/23 2002 04/02/23 0459 04/03/23 0237  WBC 6.2 7.1 6.2    Liver Function Tests: No results for input(s): "AST", "ALT", "ALKPHOS", "BILITOT", "PROT", "ALBUMIN" in the last 168 hours. No results for input(s):  "LIPASE", "AMYLASE" in the last 168 hours. No results for input(s): "AMMONIA" in the last 168 hours.  ABG No results found for: "PHART", "PCO2ART", "PO2ART", "HCO3", "TCO2", "ACIDBASEDEF", "O2SAT"   Coagulation Profile: Recent Labs  Lab 04/01/23 2002 04/02/23 0039 04/02/23 0459  INR 2.9* 1.3* 1.2    Cardiac Enzymes: No results for input(s): "CKTOTAL", "CKMB", "CKMBINDEX", "TROPONINI" in the last 168 hours.  HbA1C: No results found for: "HGBA1C"  CBG: No results for input(s): "GLUCAP" in the last 168 hours.    Past Medical History:  She,  has a past medical history of Arthritis, Cancer (HCC), Cataract cortical, senile, COPD (chronic obstructive pulmonary disease) (HCC), Coronary artery disease, Depression, DVT (deep venous thrombosis) (HCC), Edema, Hyperglycemia, Hypertension, Leukopenia, Neuropathy, Shortness of breath dyspnea, Sleep apnea, and Thrombocytopenia (HCC).   Surgical History:   Past Surgical History:  Procedure Laterality Date   BACK SURGERY     BREAST BIOPSY Left    benign needle   CHONDROPLASTY Left 05/13/2019   Procedure: CHONDROPLASTY;  Surgeon: Signa Kell, MD;  Location: [facility] ORS;  Service: Orthopedics;  Laterality: Left;   COLONOSCOPY     COLONOSCOPY WITH PROPOFOL N/A 02/24/2017   Procedure: COLONOSCOPY WITH PROPOFOL;  Surgeon: Christena Deem, MD;  Location: [facility] ENDOSCOPY;  Service: Endoscopy;  Laterality: N/A;   CORONARY ANGIOPLASTY     STENT   EYE SURGERY     GAS INSERTION Right 05/06/2015   Procedure: INSERTION OF GAS;  Surgeon: Marcelene Butte, MD;  Location: [facility] ORS;  Service: Ophthalmology;  Laterality: Right;   KNEE ARTHROSCOPY WITH MEDIAL MENISECTOMY Left 05/13/2019   Procedure: KNEE ARTHROSCOPY WITH PARTIAL  MEDIAL and lateral MENISECTOMY;  Surgeon: Signa Kell, MD;  Location: [facility] ORS;  Service: Orthopedics;  Laterality: Left;   MEMBRANE PEEL Right 05/06/2015   Procedure: MEMBRANE PEEL;  Surgeon: Marcelene Butte, MD;  Location: [facility]  ORS;  Service: Ophthalmology;  Laterality: Right;   PARS PLANA VITRECTOMY Right 05/06/2015   Procedure: PARS PLANA VITRECTOMY WITH 23 GAUGE;  Surgeon: Marcelene Butte, MD;  Location: [facility] ORS;  Service: Ophthalmology;  Laterality: Right;     Social History:   reports that she has quit smoking. She has never used smokeless tobacco. She reports that she does not drink alcohol and does not use drugs.   Family History:  Her family history includes Breast cancer (age of onset: 41) in her sister; Cancer in her mother.   Allergies Allergies  Allergen Reactions   Pneumovax [Pneumococcal Polysaccharide Vaccine] Swelling    Redness     Home Medications  Prior to Admission medications   Medication Sig Start Date End Date Taking? Authorizing Provider  aspirin EC 81 MG tablet Take 81 mg by mouth daily.    [provider]  Cholecalciferol (VITAMIN D3 PO) Take 1 tablet by mouth daily.    [provider]  citalopram (CELEXA) 20 MG tablet Take 20 mg by mouth daily.    [provider]  clobetasol ointment (TEMOVATE) 0.05 % Apply 1 Application topically as directed.  APPLY FINGERTIP SIZE AMOUNT TO AFFECTED AREA 2-3 TIMES WEEKLY FOR MAINTENANCE    [provider]  gabapentin (NEURONTIN) 300 MG capsule Take 300 mg by mouth 2 (two) times daily.     [provider]  hydrocortisone cream 1 % Apply 1 application topically daily as needed for itching.    [provider]  lisinopril (ZESTRIL) 40 MG tablet Take 40 mg by mouth daily.    [provider]  metoprolol succinate (TOPROL-XL) 25 MG 24 hr tablet Take 12.5 mg by mouth daily.    [provider]  Multiple Vitamin (MULTIVITAMIN) tablet Take 1 tablet by mouth daily.    [provider]  simvastatin (ZOCOR) 20 MG tablet Take 20 mg by mouth at bedtime.     [provider]     Critical care time: 10       CRITICAL CARE Performed by: Bevelyn Ngo   Total critical  care time: 33 minutes  Critical care time was exclusive of  separately billable procedures and treating other patients. Critical care was necessary to treat or prevent imminent or life-threatening deterioration.  Critical care was time spent personally by me on the following activities: development of treatment plan with patient and/or surrogate as well as nursing, discussions with consultants, evaluation of patient's response to treatment, examination of patient, obtaining history from patient or surrogate, ordering and performing treatments and interventions, ordering and review of laboratory studies, ordering and review of radiographic studies, pulse oximetry and re-evaluation of patient's condition.  Bevelyn Ngo, MSN, AGACNP-BC [facility] See Amion for personal pager PCCM on call pager [phone]  04/03/2023, 8:07 AM   I personally saw the patient and performed a substantive portion of this encounter, including a complete performance of at least one of the key components (MDM, Hx and/or Exam), in conjunction with the Advanced Practice Provider.    Afebrile Normotensive On exam -awake, interactive, nonfocal, extensive bruising on left side of face, S1-S2 regular, clear breath sounds bilateral. Labs show mild hypokalemia 3.3, no leukocytosis Head CT shows increased volume of intraventricular hemorrhage especially left ventricle, no hydrocephalus  Impression/plan Fall with traumatic intraventricular hemorrhage and trace SAH with orbital fracture -On Coumadin for unprovoked DVT -Discontinue Coumadin -Monitor in ICU, repeat head CT tomorrow -DNR CODE STATUS. -Outpatient follow-up with ENT  Lane Kjos V. Vassie Loll MD

## 2023-04-03 NOTE — Progress Notes (Signed)
Echocardiogram 2D Echocardiogram has been performed.  Carol Riley 04/03/2023, 11:03 AM

## 2023-04-03 NOTE — Progress Notes (Signed)
[facility] ADULT ICU REPLACEMENT PROTOCOL   The patient does apply for the [facility] Adult ICU Electrolyte Replacment Protocol based on the criteria listed below:   1.Exclusion criteria: TCTS, ECMO, Dialysis, and Myasthenia Gravis patients 2. Is GFR >/= 30 ml/min? Yes.    Patient's GFR today is 52 3. Is SCr </= 2? Yes.   Patient's SCr is 1.06 mg/dL 4. Did SCr increase >/= 0.5 in 24 hours? No. 5.Pt's weight >40kg  Yes.   6. Abnormal electrolyte(s): K+ 3.3  7. Electrolytes replaced per protocol 8.  Call MD STAT for K+ </= 2.5, Phos </= 1, or Mag </= 1 Physician:  Dr Cory Roughen, Jettie Booze 04/03/2023 4:19 AM

## 2023-04-03 NOTE — Plan of Care (Signed)
  Problem: Clinical Measurements: Goal: Will remain free from infection Outcome: Progressing Goal: Diagnostic test results will improve Outcome: Progressing Goal: Respiratory complications will improve Outcome: Progressing Goal: Cardiovascular complication will be avoided Outcome: Progressing   Problem: Activity: Goal: Risk for activity intolerance will decrease Outcome: Progressing   Problem: Coping: Goal: Level of anxiety will decrease Outcome: Progressing   

## 2023-04-03 NOTE — Progress Notes (Signed)
OT Cancellation Note  Patient Details Name: Carol Riley MRN: [id number] DOB: [birth year]   Cancelled Treatment:    Reason Eval/Treat Not Completed: Fatigue/lethargy limiting ability to participate- pt returned back to bed with RN, requesting to rest prior to OT session. Will follow and see as able.   Barry Brunner, OT Acute Rehabilitation Services Office [phone]   Carol Riley 04/03/2023, 8:49 AM

## 2023-04-03 NOTE — Progress Notes (Signed)
Occupational Therapy Evaluation Patient Details Name: Carol Riley MRN: [id number] DOB: 1941/04/12 Today's Date: 04/03/2023   History of Present Illness The pt is an 82 yo female presenting to [place] on 8/3 after a fall at home. Pt found to have acute IVH of L lateral ventrical which increased from 8mm to 11mm on repeat CT and comminuted and displaced depressed left orbital floor fracture, pt transferred to [facility]. PMH includes: COPD, HTN, OSA, PVD, CAD, cervical cancer, and DVT on anticoagulation.   Clinical Impression   PTA patient independent.  Admitted for above and presents with problem list below.  Pt completing bed mobility with supervision, transfers and mobility in room with min guard, and ADLs with up to min assist.  She requires cueing for safety, but overall cognition appears [facility].  Pt denies visual changes. She is limited by impaired balance, decreased activity tolerance and generalized weakness.  Based on performance today, believe she will best benefit from further OT services acutely and after dc at [facility] level to optimize independence, safety and return to PLOF.  Also recommend initial 24/7 support after dc home.       Recommendations for follow up therapy are one component of a multi-disciplinary discharge planning process, led by the attending physician.  Recommendations may be updated based on patient status, additional functional criteria and insurance authorization.   Assistance Recommended at Discharge Frequent or constant Supervision/Assistance  Patient can return home with the following A little help with walking and/or transfers;A little help with bathing/dressing/bathroom;Assistance with cooking/housework;Assist for transportation;Help with stairs or ramp for entrance    Functional Status Assessment  Patient has had a recent decline in their functional status and demonstrates the ability to make significant improvements in function in a reasonable and predictable amount  of time.  Equipment Recommendations  None recommended by OT    Recommendations for Other Services       Precautions / Restrictions Precautions Precautions: Fall Precaution Comments: admitted after fall Restrictions Weight Bearing Restrictions: No      Mobility Bed Mobility Overal bed mobility: Needs Assistance Bed Mobility: Supine to Sit     Supine to sit: Supervision     General bed mobility comments: supervision with HOB elevated and increased time    Transfers Overall transfer level: Needs assistance Equipment used: Rolling walker (2 wheels) Transfers: Sit to/from Stand Sit to Stand: Min guard           General transfer comment: cueing for hand placement      Balance Overall balance assessment: Needs assistance Sitting-balance support: No upper extremity supported, Feet supported Sitting balance-Leahy Scale: Good Sitting balance - Comments: dynamically min guard required for safety/balance   Standing balance support: No upper extremity supported, Bilateral upper extremity supported, During functional activity Standing balance-Leahy Scale: Fair                             ADL either performed or assessed with clinical judgement   ADL Overall ADL's : Needs assistance/impaired     Grooming: Min guard;Standing           Upper Body Dressing : Set up;Sitting   Lower Body Dressing: Minimal assistance;Sit to/from stand Lower Body Dressing Details (indicate cue type and reason): pt typically wear slip on shoes, not socks at home Toilet Transfer: Min guard;Ambulation;Rolling walker (2 wheels)   Toileting- Clothing Manipulation and Hygiene: Min guard;Sit to/from stand       Functional mobility during  ADLs: Min guard;Rolling walker (2 wheels)       Vision   Vision Assessment?: No apparent visual deficits     Perception     Praxis      Pertinent Vitals/Pain Pain Assessment Pain Assessment: No/denies pain     Hand Dominance  Right   Extremity/Trunk Assessment Upper Extremity Assessment Upper Extremity Assessment: Generalized weakness   Lower Extremity Assessment Lower Extremity Assessment: Defer to PT evaluation       Communication Communication Communication: No difficulties   Cognition Arousal/Alertness: Awake/alert Behavior During Therapy: WFL for tasks assessed/performed Overall Cognitive Status: Within Functional Limits for tasks assessed                                       General Comments  pt on RA with Spo2 stable, reports wearing 2L intermittently as needed at home    Exercises     Shoulder Instructions      Home Living Family/patient expects to be discharged to:: Private residence Living Arrangements: Alone Available Help at Discharge: Family;Available 24 hours/day (son lives "within walking distance") Type of Home: House Home Access: Stairs to enter [employer] of Steps: 2 at front, 8 at back Entrance Stairs-Rails: None Home Layout: One level     Bathroom Shower/Tub: Producer, television/film/video: Handicapped height Bathroom Accessibility: No   Home Equipment: Cane - quad;Cane - single point;Shower seat;Grab bars - toilet;Grab bars - tub/shower;Hand held Programmer, systems (2 wheels)   Additional Comments: son has offered to move in, pt wants to remain independent      Prior Functioning/Environment Prior Level of Function : Independent/Modified Independent             Mobility Comments: use of cane, no other falls ADLs Comments: pt reports independence with ADLs, and IADLs, has valid drivers license but no car        OT Problem List: Decreased strength;Decreased activity tolerance;Impaired balance (sitting and/or standing);Decreased safety awareness;Decreased knowledge of use of DME or AE;Decreased knowledge of precautions;Obesity      OT Treatment/Interventions: Self-care/ADL training;Therapeutic exercise;DME and/or AE  instruction;Therapeutic activities;Balance training;Patient/family education    OT Goals(Current goals can be found in the care plan section) Acute Rehab OT Goals Patient Stated Goal: get better and get home OT Goal Formulation: With patient Time For Goal Achievement: 04/17/23 Potential to Achieve Goals: Good  OT Frequency: Min 1X/week    Co-evaluation              AM-PAC OT "6 Clicks" Daily Activity     Outcome Measure Help from another person eating meals?: A Little Help from another person taking care of personal grooming?: A Little Help from another person toileting, which includes using toliet, bedpan, or urinal?: A Little Help from another person bathing (including washing, rinsing, drying)?: A Little Help from another person to put on and taking off regular upper body clothing?: A Little Help from another person to put on and taking off regular lower body clothing?: A Little 6 Click Score: 18   End of Session Equipment Utilized During Treatment: Gait belt;Rolling walker (2 wheels);Oxygen Nurse Communication: Mobility status  Activity Tolerance: Patient tolerated treatment well Patient left: in chair;with call bell/phone within reach;with chair alarm set  OT Visit Diagnosis: Other abnormalities of gait and mobility (R26.89);Muscle weakness (generalized) (M62.81);History of falling (Z91.81)  Time: 6213-0865 OT Time Calculation (min): 26 min Charges:  OT General Charges $OT Visit: 1 Visit OT Evaluation $OT Eval Moderate Complexity: 1 Mod OT Treatments $Self Care/Home Management : 8-22 mins  Barry Brunner, OT Acute Rehabilitation Services Office [phone]   Chancy Milroy 04/03/2023, 1:12 PM

## 2023-04-03 NOTE — Plan of Care (Signed)
  Problem: Education: Goal: Knowledge of General Education information will improve Description: Including pain rating scale, medication(s)/side effects and non-pharmacologic comfort measures Outcome: Progressing   Problem: Health Behavior/Discharge Planning: Goal: Ability to manage health-related needs will improve Outcome: Progressing   Problem: Clinical Measurements: Goal: Ability to maintain clinical measurements within normal limits will improve Outcome: Progressing Goal: Will remain free from infection Outcome: Progressing Goal: Diagnostic test results will improve Outcome: Progressing Goal: Respiratory complications will improve Outcome: Progressing Goal: Cardiovascular complication will be avoided Outcome: Progressing   Problem: Activity: Goal: Risk for activity intolerance will decrease Outcome: Progressing   Problem: Nutrition: Goal: Adequate nutrition will be maintained Outcome: Progressing   Problem: Coping: Goal: Level of anxiety will decrease Outcome: Progressing   Problem: Elimination: Goal: Will not experience complications related to bowel motility Outcome: Progressing Goal: Will not experience complications related to urinary retention Outcome: Progressing   Problem: Pain Managment: Goal: General experience of comfort will improve Outcome: Progressing   Problem: Safety: Goal: Ability to remain free from injury will improve Outcome: Progressing   Problem: Skin Integrity: Goal: Risk for impaired skin integrity will decrease Outcome: Progressing   Problem: Education: Goal: Knowledge of disease or condition will improve Outcome: Progressing Goal: Knowledge of secondary prevention will improve (MUST DOCUMENT ALL) Outcome: Progressing Goal: Knowledge of patient specific risk factors will improve (Mark N/A or DELETE if not current risk factor) Outcome: Progressing   Problem: Intracerebral Hemorrhage Tissue Perfusion: Goal: Complications of  Intracerebral Hemorrhage will be minimized Outcome: Progressing   Problem: Coping: Goal: Will verbalize positive feelings about self Outcome: Progressing Goal: Will identify appropriate support needs Outcome: Progressing   Problem: Health Behavior/Discharge Planning: Goal: Ability to manage health-related needs will improve Outcome: Progressing Goal: Goals will be collaboratively established with patient/family Outcome: Progressing   Problem: Self-Care: Goal: Ability to participate in self-care as condition permits will improve Outcome: Progressing Goal: Verbalization of feelings and concerns over difficulty with self-care will improve Outcome: Progressing Goal: Ability to communicate needs accurately will improve Outcome: Progressing   Problem: Nutrition: Goal: Risk of aspiration will decrease Outcome: Progressing Goal: Dietary intake will improve Outcome: Progressing   

## 2023-04-03 NOTE — Progress Notes (Signed)
Overall doing well.  Minimal headache.  No other neurologic symptoms.  Afebrile.  Vital signs are stable.  Awake and alert.  Oriented and appropriate.  Patient seen while up ambulatory in the hallway.  Follow-up head CT scan demonstrates some increased blood in her left lateral ventricle which is likely secondary to circulation of her intraventricular hematoma.  I am not worried that there is any active hemorrhage ongoing.  I do not think follow-up scans are necessary unless her clinical situation changes.  Patient may be mobilized ad lib..  May work to discharge once other medical issues are resolved.  Given her risk of falling and advanced age I think continued use of anticoagulation is contraindicated.

## 2023-04-04 DIAGNOSIS — I615 Nontraumatic intracerebral hemorrhage, intraventricular: Secondary | ICD-10-CM | POA: Diagnosis not present

## 2023-04-04 DIAGNOSIS — R55 Syncope and collapse: Secondary | ICD-10-CM | POA: Diagnosis not present

## 2023-04-04 MED ORDER — GUAIFENESIN ER 600 MG PO TB12
600.0000 mg | ORAL_TABLET | Freq: Two times a day (BID) | ORAL | Status: DC
  Administered 2023-04-04 – 2023-04-06 (×5): 600 mg via ORAL
  Filled 2023-04-04 (×7): qty 1

## 2023-04-04 NOTE — Consult Note (Addendum)
Cardiology Consultation   Patient ID: Carol Riley MRN: [id number]; DOB: 06/07/1941  Admit date: 04/02/2023 Date of Consult: 04/04/2023  PCP:  Lynnea Ferrier, MD   [facility] Providers Cardiologist:  None        Patient Profile:   Carol Riley is a 82 y.o. 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 Vassie Loll.  History of Present Illness:   Carol Riley was originally admitted to [facility] on 04/02/23 after a fall at home with LOC. Per EMS runsheet, patient reported that she was standing in the kitchen when she slipped, falling onto the floor on her left side (EMS apparently noted a wet spot on the floor in the kitchen). Given periorbital swelling and anticoagulated status, EMS recommended transport to [facility] but patient requested to be taken to [facility]. Initial trauma workup found acute IVH, CT maxillofacial showed comminuted and displaced depressed left orbital floor fracture. Because of repeat head CT showing increase in size of bleed, patient transferred to [facility] trauma ICU. Per Dr. Vassie Loll noted with primary team, telemetry with an episode concerning for afib with RVR with spontaneous resolution.   On my exam, patient alert and talkative. Reports that she is overall feeling well given her recent fall. She reports that she lives independently and though she does not drive, she remains active. Denies recent symptoms concerning for cardiac etiology including chest pain, palpitations, lightheadedness/dizziness, lower extremity edema, orthopnea. She has essentially no recollection of the events immediately proceeding her fall and is not sure if  she slipped/tripped. Does report several additional falls in recent months, though these were all secondary to tripping or losing balance.    Patient with remote cardiac history of CAD with PCI as above. Was last seen by [facility] cardiology in 2019.    Past Medical History:  Diagnosis Date   Arthritis    Cancer (HCC)    cervical cancer   Cataract cortical, senile    COPD (chronic obstructive pulmonary disease) (HCC)    Coronary artery disease    Depression    DVT (deep venous thrombosis) (HCC)    Edema    FEET/LEGS   Hyperglycemia    Hypertension    Leukopenia    Neuropathy    Shortness of breath dyspnea    Sleep apnea    Thrombocytopenia (HCC)     Past Surgical History:  Procedure Laterality Date   BACK SURGERY     BREAST BIOPSY Left    benign needle   CHONDROPLASTY Left 05/13/2019   Procedure: CHONDROPLASTY;  Surgeon: Signa Kell, MD;  Location: [facility] ORS;  Service: Orthopedics;  Laterality: Left;   COLONOSCOPY     COLONOSCOPY WITH PROPOFOL N/A 02/24/2017   Procedure: COLONOSCOPY WITH PROPOFOL;  Surgeon: Christena Deem, MD;  Location: [facility] ENDOSCOPY;  Service: Endoscopy;  Laterality: N/A;   CORONARY ANGIOPLASTY     STENT   EYE SURGERY     GAS INSERTION Right 05/06/2015   Procedure: INSERTION OF GAS;  Surgeon: Marcelene Butte, MD;  Location: [facility] ORS;  Service: Ophthalmology;  Laterality: Right;   KNEE ARTHROSCOPY WITH MEDIAL MENISECTOMY Left 05/13/2019   Procedure: KNEE ARTHROSCOPY WITH PARTIAL  MEDIAL and lateral  MENISECTOMY;  Surgeon: Signa Kell, MD;  Location: [facility] ORS;  Service: Orthopedics;  Laterality: Left;   MEMBRANE PEEL Right 05/06/2015   Procedure: MEMBRANE PEEL;  Surgeon: Marcelene Butte, MD;  Location: [facility] ORS;  Service: Ophthalmology;  Laterality: Right;   PARS PLANA VITRECTOMY Right 05/06/2015   Procedure: PARS PLANA VITRECTOMY WITH 23 GAUGE;  Surgeon: Marcelene Butte, MD;  Location: [facility] ORS;  Service: Ophthalmology;  Laterality: Right;     Home  Medications:  Prior to Admission medications   Medication Sig Start Date End Date Taking? Authorizing Provider  aspirin EC 81 MG tablet Take 81 mg by mouth daily.    [provider]  Cholecalciferol (VITAMIN D3 PO) Take 1 tablet by mouth daily.    [provider]  citalopram (CELEXA) 20 MG tablet Take 20 mg by mouth daily.    [provider]  clobetasol ointment (TEMOVATE) 0.05 % Apply 1 Application topically as directed.  APPLY FINGERTIP SIZE AMOUNT TO AFFECTED AREA 2-3 TIMES WEEKLY FOR MAINTENANCE    [provider]  gabapentin (NEURONTIN) 300 MG capsule Take 300 mg by mouth 2 (two) times daily.     [provider]  hydrocortisone cream 1 % Apply 1 application topically daily as needed for itching.    [provider]  lisinopril (ZESTRIL) 40 MG tablet Take 40 mg by mouth daily.    [provider]  metoprolol succinate (TOPROL-XL) 25 MG 24 hr tablet Take 12.5 mg by mouth daily.    [provider]  Multiple Vitamin (MULTIVITAMIN) tablet Take 1 tablet by mouth daily.    [provider]  simvastatin (ZOCOR) 20 MG tablet Take 20 mg by mouth at bedtime.     [provider]    Inpatient Medications: Scheduled Meds:  Chlorhexidine Gluconate Cloth  6 each Topical Daily   guaiFENesin  600 mg Oral BID   lisinopril  40 mg Oral Daily   metoprolol succinate  12.5 mg Oral Daily   simvastatin  20 mg Oral q1800   Continuous Infusions:  PRN Meds: acetaminophen, docusate sodium, ipratropium-albuterol, labetalol, metoprolol tartrate, ondansetron (ZOFRAN) IV, mouth rinse, polyethylene glycol  Allergies:    Allergies  Allergen Reactions   Pneumovax [Pneumococcal Polysaccharide Vaccine] Swelling    Redness    Social History:   Social History   Socioeconomic History   Marital status: Divorced    Spouse name: Not on file   Number of children: Not on file   Years of education: Not on file   Highest education  level: Not on file  Occupational History   Not on file  Tobacco Use   Smoking status: Former   Smokeless tobacco: Never  Vaping Use   Vaping status: Never Used  Substance and Sexual Activity   Alcohol use: No   Drug use: No   Sexual activity: Not on file  Other Topics Concern   Not on file  Social History Narrative   Not on file   Social Determinants of Health   Financial Resource Strain: Not on file  Food Insecurity: Not on file  Transportation Needs: Not on file  Physical Activity: Not on file  Stress: Not on file  Social Connections: Not on file  Intimate Partner Violence: Not on file    Family History:    Family History  Problem Relation Age of Onset   Breast cancer Sister 22   Cancer Mother      ROS:  Please see the history of present illness.  All other ROS reviewed and negative.     Physical Exam/Data:   Vitals:   04/04/23 0900 04/04/23 1000 04/04/23 1100 04/04/23 1200  BP: (!) 129/52 (!) 164/77 135/63 (!) 153/59  Pulse: 76 88 82   Resp: 18 18 16    Temp:    97.9 F (36.6 C)  TempSrc:    Oral  SpO2: 94% 96% 90%   Weight:      Height:        Intake/Output Summary (Last 24 hours) at 04/04/2023 1249 Last data filed at 04/04/2023 1200 Gross per 24 hour  Intake 450 ml  Output --  Net 450 ml      04/04/2023    7:21 AM 04/03/2023    5:00 AM 04/02/2023    5:51 AM  Last 3 Weights  Weight (lbs) 169 lb 12.1 oz 170 lb 6.7 oz 177 lb 4 oz  Weight (kg) 77 kg 77.3 kg 80.4 kg     Body mass index is 29.14 kg/m.  General:  Well nourished, well developed, in no acute distress HEENT: left side of face/periorbital region with notable bruising. Left eye with erythematous scleral injection Neck: no JVD Vascular: No carotid bruits; Distal pulses 2+ bilaterally Cardiac:  normal S1, S2; RRR; 3/6 systolic murmur at RUSB Lungs:  upper lobe end-inspiratory wheezing. Otherwise clear Abd: soft, nontender, no hepatomegaly  Ext: no edema Musculoskeletal:  No deformities,  BUE and BLE strength normal and equal Skin: warm and dry  Neuro:  CNs 2-12 intact, no focal abnormalities noted Psych:  Normal affect   EKG:  The EKG was personally reviewed and demonstrates:  sinus rhythm with PVCs Telemetry:  Telemetry was personally reviewed and demonstrates:  sinus rhythm with intermittient PACs and PVCs  Relevant CV Studies:  05/14/2018 Gated scintigraphy   Revealed normal left ventricular function, with LVEF 68%. Transient ischemic dilatation ratio was 1.36. SPECT imaging revealed mild anterior wall ischemia.   04/03/23 TTE  IMPRESSIONS     1. Left ventricular ejection fraction, by estimation, is 60 to 65%. The  left ventricle has normal function. The left ventricle has no regional  wall motion abnormalities. There is mild left ventricular hypertrophy.  Left ventricular diastolic parameters  are consistent with Grade I diastolic dysfunction (impaired relaxation).   2. Right ventricular systolic function is normal. The right ventricular  size is normal.   3. No evidence of mitral valve regurgitation.   4. The aortic valve is calcified. Aortic valve regurgitation is not  visualized. Moderate aortic valve stenosis.   5. The inferior vena cava is normal in size with greater than 50%  respiratory variability, suggesting right atrial pressure of 3 mmHg.   FINDINGS   Left Ventricle: Left ventricular ejection fraction, by estimation, is 60  to 65%. The left ventricle has normal function. The left ventricle has no  regional wall motion abnormalities. The left ventricular internal cavity  size was normal in size. There is   mild left ventricular hypertrophy. Left ventricular diastolic parameters  are consistent with Grade I diastolic dysfunction (impaired relaxation).   Right Ventricle: The right ventricular size is normal. Right ventricular  systolic function is normal.   Left Atrium: Left atrial size was normal in size.   Right Atrium: Right atrial size was  normal in size.   Pericardium: There is no evidence of pericardial effusion.   Mitral Valve: No evidence of mitral valve regurgitation.   Tricuspid Valve: The tricuspid valve is normal in structure. Tricuspid  valve regurgitation  is mild.   Aortic Valve: The aortic valve is calcified. Aortic valve regurgitation is  not visualized. Moderate aortic stenosis is present. Aortic valve mean  gradient measures 21.7 mmHg. Aortic valve peak gradient measures 37.7  mmHg. Aortic valve area, by VTI  measures 1.11 cm.   Pulmonic Valve: The pulmonic valve was normal in structure. Pulmonic valve  regurgitation is trivial.   Aorta: The aortic root and ascending aorta are structurally normal, with  no evidence of dilitation.   Venous: The inferior vena cava is normal in size with greater than 50%  respiratory variability, suggesting right atrial pressure of 3 mmHg.   IAS/Shunts: No atrial level shunt detected by color flow Doppler.   Laboratory Data:  High Sensitivity Troponin:   Recent Labs  Lab 04/02/23 1028  TROPONINIHS 12     Chemistry Recent Labs  Lab 04/02/23 0459 04/02/23 0615 04/03/23 0230 04/03/23 0237 04/04/23 0429  NA 136  --   --  135 135  K 3.5  --   --  3.3* 4.0  CL 99  --   --  92* 98  CO2 29  --   --  27 27  GLUCOSE 155*  --   --  150* 136*  BUN 16  --   --  13 18  CREATININE 0.90  --   --  1.06* 1.12*  CALCIUM 9.0  --   --  9.1 9.3  MG 1.5* 1.3* 1.8  --  2.0  GFRNONAA >60  --   --  52* 49*  ANIONGAP 8  --   --  16* 10    No results for input(s): "PROT", "ALBUMIN", "AST", "ALT", "ALKPHOS", "BILITOT" in the last 168 hours. Lipids No results for input(s): "CHOL", "TRIG", "HDL", "LABVLDL", "LDLCALC", "CHOLHDL" in the last 168 hours.  Hematology Recent Labs  Lab 04/02/23 0459 04/03/23 0237 04/04/23 0429  WBC 7.1 6.2 6.4  RBC 4.42 4.17 4.28  HGB 14.3 13.3 13.7  HCT 41.1 38.4 39.9  MCV 93.0 92.1 93.2  MCH 32.4 31.9 32.0  MCHC 34.8 34.6 34.3  RDW 13.1  13.3 13.4  PLT 147* 147* 159   Thyroid No results for input(s): "TSH", "FREET4" in the last 168 hours.  BNPNo results for input(s): "BNP", "PROBNP" in the last 168 hours.  DDimer No results for input(s): "DDIMER" in the last 168 hours.   Radiology/Studies:  ECHOCARDIOGRAM COMPLETE  Result Date: 04/03/2023    ECHOCARDIOGRAM REPORT   Patient Name:   Carol Riley Date of Exam: 04/03/2023 Medical Rec #:  [id number]       Height:       64.0 in Accession #:    [id number]      Weight:       170.4 lb Date of Birth:  Jun 19, 1941       BSA:          1.828 m Patient Age:    81 years        BP:           147/94 mmHg Patient Gender: F               HR:           76 bpm. Exam Location:  Inpatient Procedure: 2D Echo, Cardiac Doppler and Color Doppler Indications:    Syncope R55  History:        Patient has no prior history of Echocardiogram examinations.  CAD, COPD, Signs/Symptoms:Shortness of Breath; Risk                 Factors:Hypertension and Sleep Apnea.  Sonographer:    Lucendia Herrlich Referring Phys: [phone] WHITNEY D HARRIS IMPRESSIONS  1. Left ventricular ejection fraction, by estimation, is 60 to 65%. The left ventricle has normal function. The left ventricle has no regional wall motion abnormalities. There is mild left ventricular hypertrophy. Left ventricular diastolic parameters are consistent with Grade I diastolic dysfunction (impaired relaxation).  2. Right ventricular systolic function is normal. The right ventricular size is normal.  3. No evidence of mitral valve regurgitation.  4. The aortic valve is calcified. Aortic valve regurgitation is not visualized. Moderate aortic valve stenosis.  5. The inferior vena cava is normal in size with greater than 50% respiratory variability, suggesting right atrial pressure of 3 mmHg. FINDINGS  Left Ventricle: Left ventricular ejection fraction, by estimation, is 60 to 65%. The left ventricle has normal function. The left ventricle has no regional wall  motion abnormalities. The left ventricular internal cavity size was normal in size. There is  mild left ventricular hypertrophy. Left ventricular diastolic parameters are consistent with Grade I diastolic dysfunction (impaired relaxation). Right Ventricle: The right ventricular size is normal. Right ventricular systolic function is normal. Left Atrium: Left atrial size was normal in size. Right Atrium: Right atrial size was normal in size. Pericardium: There is no evidence of pericardial effusion. Mitral Valve: No evidence of mitral valve regurgitation. Tricuspid Valve: The tricuspid valve is normal in structure. Tricuspid valve regurgitation is mild. Aortic Valve: The aortic valve is calcified. Aortic valve regurgitation is not visualized. Moderate aortic stenosis is present. Aortic valve mean gradient measures 21.7 mmHg. Aortic valve peak gradient measures 37.7 mmHg. Aortic valve area, by VTI measures 1.11 cm. Pulmonic Valve: The pulmonic valve was normal in structure. Pulmonic valve regurgitation is trivial. Aorta: The aortic root and ascending aorta are structurally normal, with no evidence of dilitation. Venous: The inferior vena cava is normal in size with greater than 50% respiratory variability, suggesting right atrial pressure of 3 mmHg. IAS/Shunts: No atrial level shunt detected by color flow Doppler.  LEFT VENTRICLE PLAX 2D LVIDd:         3.60 cm   Diastology LVIDs:         2.30 cm   LV e' medial:    6.53 cm/s LV PW:         1.10 cm   LV E/e' medial:  11.1 LV IVS:        1.10 cm   LV e' lateral:   5.75 cm/s LVOT diam:     2.10 cm   LV E/e' lateral: 12.6 LV SV:         72 LV SV Index:   39 LVOT Area:     3.46 cm  RIGHT VENTRICLE             IVC RV S prime:     14.60 cm/s  IVC diam: 1.60 cm TAPSE (M-mode): 2.5 cm LEFT ATRIUM             Index        RIGHT ATRIUM           Index LA diam:        3.90 cm 2.13 cm/m   RA Area:     10.70 cm LA Vol (A2C):   38.0 ml 20.76 ml/m  RA Volume:   21.20 ml  11.60  ml/m LA Vol (A4C):   43.6 ml 23.86 ml/m LA Biplane Vol: 45.4 ml 24.84 ml/m  AORTIC VALVE AV Area (Vmax):    1.15 cm AV Area (Vmean):   1.11 cm AV Area (VTI):     1.11 cm AV Vmax:           307.00 cm/s AV Vmean:          215.667 cm/s AV VTI:            0.648 m AV Peak Grad:      37.7 mmHg AV Mean Grad:      21.7 mmHg LVOT Vmax:         101.77 cm/s LVOT Vmean:        69.067 cm/s LVOT VTI:          0.208 m LVOT/AV VTI ratio: 0.32  AORTA Ao Root diam: 3.20 cm Ao Asc diam:  3.00 cm MITRAL VALVE                TRICUSPID VALVE MV Area (PHT): 2.95 cm     TR Peak grad:   39.4 mmHg MV Decel Time: 257 msec     TR Vmax:        314.00 cm/s MV E velocity: 72.60 cm/s MV A velocity: 109.00 cm/s  SHUNTS MV E/A ratio:  0.67         Systemic VTI:  0.21 m                             Systemic Diam: 2.10 cm Carolan Clines Electronically signed by Carolan Clines Signature Date/Time: 04/03/2023/11:21:12 AM    Final    CT HEAD WO CONTRAST ( )  Result Date: 04/03/2023 CLINICAL DATA:  Follow-up examination for intracranial hemorrhage. EXAM: CT HEAD WITHOUT CONTRAST TECHNIQUE: Contiguous axial images were obtained from the base of the skull through the vertex without intravenous contrast. RADIATION DOSE REDUCTION: This exam was performed according to the departmental dose-optimization program which includes automated exposure control, adjustment of the mA and/or kV according to patient size and/or use of iterative reconstruction technique. COMPARISON:  Prior CT from 04/02/2023. FINDINGS: Brain: There has been continued interval increase in volume of intraventricular hemorrhage, with increased blood now seen involving the left greater than right lateral ventricles. Ventricular size is overall not significantly changed without hydrocephalus. No more than trace residual subarachnoid hemorrhage at the left sylvian fissure. No other new acute intracranial hemorrhage. No acute large vessel territory infarct. No mass lesion or midline shift. No  extra-axial fluid collection. Atrophy with chronic small vessel ischemic disease again noted. Vascular: No abnormal hyperdense vessel. Calcified atherosclerosis present at the skull base. Skull: Evolving left-sided scalp contusion.  Calvarium intact. Sinuses/Orbits: Left-sided facial fractures with associated left maxillary hemosinus, partially visualized. Findings described on prior maxillofacial CT. No new finding. Other: No significant mastoid effusion. IMPRESSION: 1. Continued interval increase in volume of intraventricular hemorrhage, with increased blood now seen involving the left greater than right lateral ventricles. Ventricular size is overall not significantly changed without hydrocephalus. 2. No more than trace residual subarachnoid hemorrhage at the left Sylvian fissure. 3. No other new acute intracranial abnormality. Electronically Signed   By: Rise Mu M.D.   On: 04/03/2023 00:36   CT HEAD WO CONTRAST ( )  Result Date: 04/02/2023 CLINICAL DATA:  IVH EXAM: CT HEAD WITHOUT CONTRAST TECHNIQUE: Contiguous axial images were obtained from the base of the skull through the vertex without  intravenous contrast. RADIATION DOSE REDUCTION: This exam was performed according to the departmental dose-optimization program which includes automated exposure control, adjustment of the mA and/or kV according to patient size and/or use of iterative reconstruction technique. COMPARISON:  CT head 04/02/23 FINDINGS: Brain: Progressive increase in size intraventricular blood products, now with increased size of the blood products along the septum pellucidum measuring 1.4 x 1.1 cm, previously 1.1 x 0.9 cm. There is also increased blood products in the left occipital horn. There is interval decrease in subarachnoid blood products in the left sylvian fissure. No hydrocephalus. No extra-axial fluid collection. No CT evidence of an acute cortical infarct. Sequela of moderate to severe chronic microvascular ischemic  change. Vascular: No hyperdense vessel or unexpected calcification. Skull: Soft hematoma along the left frontal scalp, unchanged from prior exam. No evidence of underlying calvarial fracture. Sinuses/Orbits: No middle ear or mastoid effusion. Near-complete opacification of the left maxillary sinus. Left-sided facial bone fractures are better described on prior facial bone CT. Bilateral lens replacement. Other: None. IMPRESSION: Progressive increase in volume of intraventricular blood products, now with increased volume of the blood products along the septum pellucidum measuring 1.4 x 1.1 cm, previously 1.1 x 0.9 cm. There is also increased layering blood products in the left occipital horn. No hydrocephalus Electronically Signed   By: Lorenza Cambridge M.D.   On: 04/02/2023 12:09   CT Head Wo Contrast  Result Date: 04/02/2023 CLINICAL DATA:  Follow-up examination for intracranial hemorrhage. EXAM: CT HEAD WITHOUT CONTRAST TECHNIQUE: Contiguous axial images were obtained from the base of the skull through the vertex without intravenous contrast. RADIATION DOSE REDUCTION: This exam was performed according to the departmental dose-optimization program which includes automated exposure control, adjustment of the mA and/or kV according to patient size and/or use of iterative reconstruction technique. COMPARISON:  CT from 04/01/2023. FINDINGS: Brain: Interval increase in size of small volume intraventricular hemorrhage within the left lateral ventricle, now measuring up to 11 mm. Trace layering blood present within the left occipital horn. Trace subarachnoid hemorrhage now seen at the left sylvian fissure. No other acute intracranial hemorrhage. No acute large vessel territory infarct. No mass lesion or midline shift. No hydrocephalus or significant extra-axial fluid collection. Vascular: No abnormal hyperdense vessel. Scattered vascular calcifications noted within the carotid siphons. Skull: Evolving left periorbital and  facial contusion. Calvarium demonstrates no new finding. Sinuses/Orbits: Left-sided facial fracture with associated left maxillary hemosinus. Globes and orbital soft tissues demonstrate no other acute finding. Trace left mastoid effusion noted. Other: None. IMPRESSION: 1. Interval increase in size of small volume intraventricular hemorrhage within the left lateral ventricle, now measuring up to 11 mm. 2. Trace subarachnoid hemorrhage at the left Sylvian fissure. 3. Evolving left periorbital and facial contusion. Left-sided facial fractures described on prior exam. Electronically Signed   By: Rise Mu M.D.   On: 04/02/2023 02:35   CT Head Wo Contrast  Addendum Date: 04/01/2023   ADDENDUM REPORT: 04/01/2023 22:01 ADDENDUM: These results were called by telephone at the time of interpretation on 04/01/2023 at 10:01 pm to provider MARK QUALE , who verbally acknowledged these results. Electronically Signed   By: Tish Frederickson M.D.   On: 04/01/2023 22:01   Result Date: 04/01/2023 CLINICAL DATA:  Facial trauma, blunt; Neck trauma (Age >= 65y) EXAM: CT HEAD WITHOUT CONTRAST CT MAXILLOFACIAL WITHOUT CONTRAST CT CERVICAL SPINE WITHOUT CONTRAST TECHNIQUE: Multidetector CT imaging of the head, cervical spine, and maxillofacial structures were performed using the standard protocol without intravenous contrast. Multiplanar  CT image reconstructions of the cervical spine and maxillofacial structures were also generated. RADIATION DOSE REDUCTION: This exam was performed according to the departmental dose-optimization program which includes automated exposure control, adjustment of the mA and/or kV according to patient size and/or use of iterative reconstruction technique. COMPARISON:  CT head 06/17/2018 FINDINGS: CT HEAD FINDINGS Brain: Cerebral ventricle sizes are concordant with the degree of cerebral volume loss. Patchy and confluent areas of decreased attenuation are noted throughout the deep and periventricular  white matter of the cerebral hemispheres bilaterally, compatible with chronic microvascular ischemic disease. No evidence of large-territorial acute infarction. No intraparenchymal hemorrhage. No mass lesion. No extra-axial collection. No mass effect or midline shift. An 8 mm rounded hyperdensity within the frontal horn of the left lateral ventricle.No hydrocephalus. Basilar cisterns are patent. Vascular: No hyperdense vessel. Atherosclerotic calcifications are present within the cavernous internal carotid arteries. Skull: No acute fracture or focal lesion. Other: Left frontal scalp hematoma formation. CT MAXILLOFACIAL FINDINGS Osseous: Comminuted and displaced 3 mm depressed left orbital floor fracture. Sinuses/Orbits: Blood products partially opacified left maxillary sinus. Paranasal sinuses and mastoid air cells are clear. Bilateral lens replacement. Otherwise the orbits are unremarkable. Soft tissues: Left maxillary subcutaneus soft tissue hematoma formation. Left periorbital subcutaneus soft tissue hematoma formation. CT CERVICAL SPINE FINDINGS Alignment: Normal. Skull base and vertebrae: Multilevel moderate severe degenerative changes of the spine with associated multilevel posterior disc osteophyte complex formation. At least moderate to severe osseous neural foraminal stenosis at the C3-C4 level, right C4 - C5 level. No severe osseous central canal stenosis. No acute fracture. No aggressive appearing focal osseous lesion or focal pathologic process. Soft tissues and spinal canal: No prevertebral fluid or swelling. No visible canal hematoma. Upper chest: Unremarkable. Other: None. IMPRESSION: 1. An 8 mm rounded hyperdensity within the frontal horn of the left lateral ventricle suggestive of blood products and acute intraventricular hemorrhage. No associated hydrocephalus. Differential diagnosis includes a mass. 2. Comminuted and displaced 3 mm depressed left orbital floor fracture. Correlate with physical  exam for extraocular muscle entrapment. 3. No acute displaced fracture or traumatic listhesis of the cervical spine. Electronically Signed: By: Tish Frederickson M.D. On: 04/01/2023 21:55   CT Maxillofacial Wo Contrast  Addendum Date: 04/01/2023   ADDENDUM REPORT: 04/01/2023 22:01 ADDENDUM: These results were called by telephone at the time of interpretation on 04/01/2023 at 10:01 pm to provider MARK QUALE , who verbally acknowledged these results. Electronically Signed   By: Tish Frederickson M.D.   On: 04/01/2023 22:01   Result Date: 04/01/2023 CLINICAL DATA:  Facial trauma, blunt; Neck trauma (Age >= 65y) EXAM: CT HEAD WITHOUT CONTRAST CT MAXILLOFACIAL WITHOUT CONTRAST CT CERVICAL SPINE WITHOUT CONTRAST TECHNIQUE: Multidetector CT imaging of the head, cervical spine, and maxillofacial structures were performed using the standard protocol without intravenous contrast. Multiplanar CT image reconstructions of the cervical spine and maxillofacial structures were also generated. RADIATION DOSE REDUCTION: This exam was performed according to the departmental dose-optimization program which includes automated exposure control, adjustment of the mA and/or kV according to patient size and/or use of iterative reconstruction technique. COMPARISON:  CT head 06/17/2018 FINDINGS: CT HEAD FINDINGS Brain: Cerebral ventricle sizes are concordant with the degree of cerebral volume loss. Patchy and confluent areas of decreased attenuation are noted throughout the deep and periventricular white matter of the cerebral hemispheres bilaterally, compatible with chronic microvascular ischemic disease. No evidence of large-territorial acute infarction. No intraparenchymal hemorrhage. No mass lesion. No extra-axial collection. No mass effect or midline shift.  An 8 mm rounded hyperdensity within the frontal horn of the left lateral ventricle.No hydrocephalus. Basilar cisterns are patent. Vascular: No hyperdense vessel. Atherosclerotic  calcifications are present within the cavernous internal carotid arteries. Skull: No acute fracture or focal lesion. Other: Left frontal scalp hematoma formation. CT MAXILLOFACIAL FINDINGS Osseous: Comminuted and displaced 3 mm depressed left orbital floor fracture. Sinuses/Orbits: Blood products partially opacified left maxillary sinus. Paranasal sinuses and mastoid air cells are clear. Bilateral lens replacement. Otherwise the orbits are unremarkable. Soft tissues: Left maxillary subcutaneus soft tissue hematoma formation. Left periorbital subcutaneus soft tissue hematoma formation. CT CERVICAL SPINE FINDINGS Alignment: Normal. Skull base and vertebrae: Multilevel moderate severe degenerative changes of the spine with associated multilevel posterior disc osteophyte complex formation. At least moderate to severe osseous neural foraminal stenosis at the C3-C4 level, right C4 - C5 level. No severe osseous central canal stenosis. No acute fracture. No aggressive appearing focal osseous lesion or focal pathologic process. Soft tissues and spinal canal: No prevertebral fluid or swelling. No visible canal hematoma. Upper chest: Unremarkable. Other: None. IMPRESSION: 1. An 8 mm rounded hyperdensity within the frontal horn of the left lateral ventricle suggestive of blood products and acute intraventricular hemorrhage. No associated hydrocephalus. Differential diagnosis includes a mass. 2. Comminuted and displaced 3 mm depressed left orbital floor fracture. Correlate with physical exam for extraocular muscle entrapment. 3. No acute displaced fracture or traumatic listhesis of the cervical spine. Electronically Signed: By: Tish Frederickson M.D. On: 04/01/2023 21:55   CT Cervical Spine Wo Contrast  Addendum Date: 04/01/2023   ADDENDUM REPORT: 04/01/2023 22:01 ADDENDUM: These results were called by telephone at the time of interpretation on 04/01/2023 at 10:01 pm to provider MARK QUALE , who verbally acknowledged these  results. Electronically Signed   By: Tish Frederickson M.D.   On: 04/01/2023 22:01   Result Date: 04/01/2023 CLINICAL DATA:  Facial trauma, blunt; Neck trauma (Age >= 65y) EXAM: CT HEAD WITHOUT CONTRAST CT MAXILLOFACIAL WITHOUT CONTRAST CT CERVICAL SPINE WITHOUT CONTRAST TECHNIQUE: Multidetector CT imaging of the head, cervical spine, and maxillofacial structures were performed using the standard protocol without intravenous contrast. Multiplanar CT image reconstructions of the cervical spine and maxillofacial structures were also generated. RADIATION DOSE REDUCTION: This exam was performed according to the departmental dose-optimization program which includes automated exposure control, adjustment of the mA and/or kV according to patient size and/or use of iterative reconstruction technique. COMPARISON:  CT head 06/17/2018 FINDINGS: CT HEAD FINDINGS Brain: Cerebral ventricle sizes are concordant with the degree of cerebral volume loss. Patchy and confluent areas of decreased attenuation are noted throughout the deep and periventricular white matter of the cerebral hemispheres bilaterally, compatible with chronic microvascular ischemic disease. No evidence of large-territorial acute infarction. No intraparenchymal hemorrhage. No mass lesion. No extra-axial collection. No mass effect or midline shift. An 8 mm rounded hyperdensity within the frontal horn of the left lateral ventricle.No hydrocephalus. Basilar cisterns are patent. Vascular: No hyperdense vessel. Atherosclerotic calcifications are present within the cavernous internal carotid arteries. Skull: No acute fracture or focal lesion. Other: Left frontal scalp hematoma formation. CT MAXILLOFACIAL FINDINGS Osseous: Comminuted and displaced 3 mm depressed left orbital floor fracture. Sinuses/Orbits: Blood products partially opacified left maxillary sinus. Paranasal sinuses and mastoid air cells are clear. Bilateral lens replacement. Otherwise the orbits are  unremarkable. Soft tissues: Left maxillary subcutaneus soft tissue hematoma formation. Left periorbital subcutaneus soft tissue hematoma formation. CT CERVICAL SPINE FINDINGS Alignment: Normal. Skull base and vertebrae: Multilevel moderate severe degenerative changes  of the spine with associated multilevel posterior disc osteophyte complex formation. At least moderate to severe osseous neural foraminal stenosis at the C3-C4 level, right C4 - C5 level. No severe osseous central canal stenosis. No acute fracture. No aggressive appearing focal osseous lesion or focal pathologic process. Soft tissues and spinal canal: No prevertebral fluid or swelling. No visible canal hematoma. Upper chest: Unremarkable. Other: None. IMPRESSION: 1. An 8 mm rounded hyperdensity within the frontal horn of the left lateral ventricle suggestive of blood products and acute intraventricular hemorrhage. No associated hydrocephalus. Differential diagnosis includes a mass. 2. Comminuted and displaced 3 mm depressed left orbital floor fracture. Correlate with physical exam for extraocular muscle entrapment. 3. No acute displaced fracture or traumatic listhesis of the cervical spine. Electronically Signed: By: Tish Frederickson M.D. On: 04/01/2023 21:55   DG Humerus Left  Result Date: 04/01/2023 CLINICAL DATA:  Fall, swelling, upper arm pain. EXAM: LEFT HUMERUS - 2+ VIEW COMPARISON:  None Available. FINDINGS: There is no evidence of fracture or other focal bone lesions. Superior subluxation of the humeral head abutting the undersurface of the acromion. Elbow alignment is maintained. There is soft tissue edema in the upper lateral arm. IMPRESSION: 1. No fracture of the left humerus. 2. Lateral soft tissue edema. 3. Superior subluxation of the humeral head abutting the undersurface of the acromion consistent with chronic rotator cuff arthropathy. Electronically Signed   By: Narda Rutherford M.D.   On: 04/01/2023 20:47     Assessment and Plan:    Fall at home with loss of consciousness  Patient admitted at [facility] following a fall at home with LOC with subsequent transfer to [facility] with acute IVH. No clear etiology of fall as patient does not remember the incident. Cardiology asked to see patient today given concern of isolated episode of afib on telemetry in the context of recent fall.   Patient without arrhythmia history prior to this admission and on my review of telemetry, no afib noted. Patient with sinus rhythm and isolated premature atrial contractions.  Given fall without clear cause, would be reasonable to place a live Zio monitor at discharge with outpatient follow up afterwards. Please page cardiology prior to discharge so this may be arranged.  Aortic valve stenosis  TTE this admission shows moderate aortic stenosis with mean gradient 21.48mmHg and peak gradient 37.7 mmHg. VTI 1.11 cm2.   No clear evidence of symptomatic aortic stenosis. Low suspicion that AS would have contributed to her fall. Will need ongoing monitoring for progressing AS.  CAD  Patient with remote history of CAD with Cypher stents in the proximal left circumflex and mid right coronary artery in 06/2004.   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  Continue Simvastatin 20mg . Last LDL 57 per April 2024 lipid panel.  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 In reviewing old records, I also found statements of "pulmonary embolism" but not clear if this also occurred with DVT. In the acute setting of IVH, anticoagulation will be held. If patient truly only had a DVT, with mention of several recent falls, would consider holding OAC longterm with risks of bleeding. If she did have a PE historically, would switch to Eliquis from Warfarin.   Hypertension  BP generally stable this admission. Home med list includes Chlorthalidone 25mg , Lisinoprol 40mg ,  Toprol XL 25mg . Plan to resume at discharge.   Per primary team:  COPD  Risk Assessment/Risk Scores:                For questions or updates, please contact [facility] Please consult [email or web address] for contact info under    Signed, Perlie Gold, PA-C  04/04/2023 12:49 PM  Agree with note by Perlie Gold, PA-C  Patient admitted with head trauma from a fall.  It is unclear what the precipitating factors were.  She was on Coumadin for remote DVT.  She has had several falls in the last year with some being mechanical.  I reviewed her monitor and see only sinus rhythm.  Her exam is benign.  She does have a soft outflow tract murmur consistent with aortic stenosis.  I concur that a 30-day event monitor would be reasonable to see if there were any arrhythmogenic precipitating causes.  I am wondering whether she still needs to be on oral anticoagulation given her fall risk.  We will arrange a 30-day monitor at discharge and follow-up with primary cardiologist at [facility].  Runell Gess, M.D., FACP, [facility], [facility], [facility] [address] [place], [place]  [address]  [phone] 04/04/2023 2:59 PM

## 2023-04-04 NOTE — Progress Notes (Signed)
Physical Therapy Treatment Patient Details Name: Carol Riley MRN: [id number] DOB: June 30, 1941 Today's Date: 04/04/2023   History of Present Illness The pt is an 82 yo female presenting to [facility] on 8/3 after a fall at home. Pt found to have acute IVH of L lateral ventrical which increased from 8mm to 11mm on repeat CT and comminuted and displaced depressed left orbital floor fracture, pt transferred to [facility]. PMH includes: COPD, HTN, OSA, PVD, CAD, cervical cancer, and DVT on anticoagulation.    PT Comments  Pt progressing steadily towards her physical therapy goals, exhibiting increased activity tolerance and ambulation distance this session. Ambulating 300 ft with a walker at a min guard assist level. Will benefit from HHPT at d/c to address endurance, balance, and strengthening.    If plan is discharge home, recommend the following: A little help with walking and/or transfers;A little help with bathing/dressing/bathroom;Assistance with cooking/housework;Assist for transportation;Help with stairs or ramp for entrance   Can travel by private vehicle        Equipment Recommendations  Rolling walker (2 wheels)    Recommendations for Other Services       Precautions / Restrictions Precautions Precautions: Fall Precaution Comments: admitted after fall Restrictions Weight Bearing Restrictions: No     Mobility  Bed Mobility Overal bed mobility: Needs Assistance Bed Mobility: Supine to Sit     Supine to sit: Min assist     General bed mobility comments: Light minA to sit up fully    Transfers Overall transfer level: Needs assistance Equipment used: Rolling walker (2 wheels) Transfers: Sit to/from Stand Sit to Stand: Supervision                Ambulation/Gait Ambulation/Gait assistance: Min guard Gait Distance (Feet): 300 Feet Assistive device: Rolling walker (2 wheels) Gait Pattern/deviations: Decreased stride length, Step-through pattern Gait velocity:  decreased     General Gait Details: Slow and steady pace,  min guard for balance   Stairs             Wheelchair Mobility     Tilt Bed    Modified Rankin (Stroke Patients Only) Modified Rankin (Stroke Patients Only) Pre-Morbid Rankin Score: No symptoms Modified Rankin: Moderately severe disability     Balance Overall balance assessment: Needs assistance Sitting-balance support: No upper extremity supported, Feet supported Sitting balance-Leahy Scale: Good     Standing balance support: Single extremity supported, During functional activity Standing balance-Leahy Scale: Fair                              Cognition Arousal/Alertness: Awake/alert Behavior During Therapy: WFL for tasks assessed/performed Overall Cognitive Status: Within Functional Limits for tasks assessed                                          Exercises      General Comments General comments (skin integrity, edema, etc.): SpO2 90-92% on RA      Pertinent Vitals/Pain Pain Assessment Pain Assessment: No/denies pain    Home Living                          Prior Function            PT Goals (current goals can now be found in the care plan section) Acute Rehab PT Goals  Patient Stated Goal: return home PT Goal Formulation: With patient Time For Goal Achievement: 04/16/23 Potential to Achieve Goals: Good Progress towards PT goals: Progressing toward goals    Frequency    Min 1X/week      PT Plan Current plan remains appropriate    Co-evaluation              AM-PAC PT "6 Clicks" Mobility   Outcome Measure  Help needed turning from your back to your side while in a flat bed without using bedrails?: A Little Help needed moving from lying on your back to sitting on the side of a flat bed without using bedrails?: A Little Help needed moving to and from a bed to a chair (including a wheelchair)?: A Little Help needed standing up from a  chair using your arms (e.g., wheelchair or bedside chair)?: A Little Help needed to walk in hospital room?: A Little Help needed climbing 3-5 steps with a railing? : A Lot 6 Click Score: 17    End of Session Equipment Utilized During Treatment: Gait belt Activity Tolerance: Patient tolerated treatment well Patient left: with call bell/phone within reach;in chair;with chair alarm set Nurse Communication: Mobility status PT Visit Diagnosis: Other abnormalities of gait and mobility (R26.89);Unsteadiness on feet (R26.81)     Time: 2725-3664 PT Time Calculation (min) (ACUTE ONLY): 19 min  Charges:    $Therapeutic Activity: 8-22 mins PT General Charges $$ ACUTE PT VISIT: 1 Visit                     Lillia Pauls, PT, DPT Acute Rehabilitation Services Office [phone]    Norval Morton 04/04/2023, 12:20 PM

## 2023-04-04 NOTE — Progress Notes (Signed)
NAME:  SHANISHA YANO, MRN:  [id number], DOB:  Feb 27, 1941, LOS: 2 ADMISSION DATE:  04/02/2023, CONSULTATION DATE:  04/02/23 REFERRING MD:  Pryor Montes CCM NP, CHIEF COMPLAINT:  IVH   History of Present Illness:  82 yo f PMH DVT on coumadin, COPD, CAD who was BIB EMS to [facility] 8/3 after fall at home. Last she remembers she was in the kitchen, has no recollection of fall or immediate preceding events. Awoke on the floor. With active AC, fall with + LOC and obvious facial bruising EMS recommended bypassing [facility] for [facility]. Pt refused this and presented to [facility] where CT H revealed 8mm IVH and CT max revealed a displaced L orbital floor fx. Given Kcentra.   It looks like there were lots of calls related to dispo/transfer -- [facility], as well as [facility] trauma, before admitting to CCM at [facility] and discussion w on call NSGY. Repeat CT H was obtained and revealed incr in size of IVH to 11mm. EM d/w NSGY who was comfortable continuing care at [facility]. It sounds like decision for transfer is related to possible ophtho needs, not NSGY.  Pt transferred to [facility] ICU in this setting.   Pertinent  Medical History  DVT Chronic AC CAD  COPD  HTN  Significant Hospital Events: Including procedures, antibiotic start and stop dates in addition to other pertinent events   8/3 [facility] after fall at home. 8mm IVH and L  orbital floor fx. Given Kcentra 8/4 admit to [facility] but then transferred to [facility] ICU-- had expansion in IVH to 11mm  04/03/2023 Continued interval increase in volume of intraventricular hemorrhage. No Hydrocephalus, trace residual subarachnoid hemorrhage, left Sylvian fissure. INR pending   Interim History / Subjective:  Slight tearing from left eye (clear), mild slight headache earlier she thinks from caffeine/ coffee withdrawal but slept better and overall feels better.   Short run of afib with RVR on tele review> asymptomatic, resolved spont.   Objective   Blood pressure 139/61, pulse 81, temperature 97.8 F (36.6  C), temperature source Oral, resp. rate 20, height 5\' 4"  (1.626 m), weight 77 kg, SpO2 94%.        Intake/Output Summary (Last 24 hours) at 04/04/2023 4098 Last data filed at 04/04/2023 0800 Gross per 24 hour  Intake 300.35 ml  Output --  Net 300.35 ml   Filed Weights   04/02/23 0551 04/03/23 0500 04/04/23 0721  Weight: 80.4 kg 77.3 kg 77 kg    Examination: General:  Pleasant elderly female sitting upright in bed in NAD HEENT: MM pink/moist, pupils 3/r, left eye with subconjunctival hemorrhage, left bandage over left eyebrow, ecchymosis to left orbital and down left face, EOM intact, no obvious visual deficits Neuro:  Alert, oriented x3, MAE CV:  rr ir, soft murmur PULM:  non labored, on 2L [place] baseline, faint exp wheeze, diminished in bases, mostly dry cough, reports occasional clear sputum GI: soft, obese, +bs, NT Extremities: warm/dry, trace LE edema, chronic venous stasis changes in LE, LLE chronically darker appearing per pt Skin: no rashes, large dense ecchymosis to left upper arm, left forearm  Afebrile Labs reviewed> normal CBC w/ diff, sCr 1.06> 1.12, Mag 2, K 4 Multiple unmeasured urinary occurrences   TTE> EF 60-65%, mild LVH, G1DD, normal RV, mod AS  Resolved Hospital Problem list     Assessment & Plan:   Fall with + LOC  Traumatic IVH in pt on chronic AC -- 11mm  L orbital floor fx in  setting of above  -rcvd Kcentra in ED. Initial IVF 8mm, incr to 11mm on repeat CT at [facility] - 8/5 CT Head shows Continued interval increase in volume of intraventricular hemorrhage. No Hydrocephalus, Trace SubDural P - remains neurologically stable.  OK from NSGY standpoint to transfer out of ICU - cont neuro checks - cont hemodynamic support as below - repeat CTH for any neuro changes - cont holding AC and antiplatelets> will need further risk/ benefit discussions output prior to resuming given high fall risk - cont PT /OT - Will need OP follow up within 1 week after discharge  with ENT regarding orbital fracture. ( Per ENT and Ophthalmology recommendations , see note 8/4 at 3:30 pm)    HTN Sinus arrhythmia Aortic stenosis, moderate -?new seen on echo HLD - cont lisinopril 40mg  daily, toprol XL 12.5mg  daily for ideal SBP < 140 - prn labetalol - keep K > 4, Mag > 2  - cont crestor - pt can not recall events prior to fall.  EMS reports pt reported she slipped or tripped.  Pt can not recall events.  Denies any prior dizziness spells or chest discomfort/ palpiations - on tele review> pt has one episode that appears to be afib with RVR, resolved without intervention, asymptomatic.   - echo noted as above.  Will consult cardiology and will likely need dispo home on cardiac monitor/ xio patch   COPD Chronic hypoxic respiratory failure on HOT 2L P - IS, mobility  - not on home inhalers, says can not afford them.  Follow with Dr. Meredeth Ide - cont duonebs prn  - mucinex prn    Hx DVT -chronic coumadin P - cont holding coumadin.  NSGY does not recommend restarting.  High fall risk.  Will need ongoing risk/ benefits discussions as outpt   Hypomagnesemia P - keep > 2, trend on labs  Hypokalemia P - keep > 4, at goal today, trend on BMET   DNR status -in paper chart there is a DNR form, at [facility] it looks like there was a conversation specific to code status and pt indicated full code. -for [place], both her and son are indicating DNR  -will change code status back to DNR   Best Practice (right click and "Reselect all SmartList Selections" daily)   Diet/type: Heart healthy DVT prophylaxis: SCD GI prophylaxis: N/A Lines: N/A Foley:  N/A Code Status:  DNR Last date of multidisciplinary goals of care discussion  - pt updated on plan of care.  Will transfer to PCU and to [facility] as of 8/7  Labs   CBC: Recent Labs  Lab 04/01/23 2002 04/02/23 0459 04/03/23 0237 04/04/23 0429  WBC 6.2 7.1 6.2 6.4  NEUTROABS  --   --   --  3.4  HGB 14.6 14.3 13.3 13.7  HCT  41.9 41.1 38.4 39.9  MCV 93.5 93.0 92.1 93.2  PLT 149* 147* 147* 159    Basic Metabolic Panel: Recent Labs  Lab 04/01/23 2002 04/02/23 0459 04/02/23 0615 04/03/23 0230 04/03/23 0237 04/04/23 0429  NA 134* 136  --   --  135 135  K 3.5 3.5  --   --  3.3* 4.0  CL 99 99  --   --  92* 98  CO2 26 29  --   --  27 27  GLUCOSE 173* 155*  --   --  150* 136*  BUN 18 16  --   --  13 18  CREATININE 0.97 0.90  --   --  1.06* 1.12*  CALCIUM 9.0 9.0  --   --  9.1 9.3  MG  --  1.5* 1.3* 1.8  --  2.0  PHOS  --  3.3  --   --   --   --    GFR: Estimated Creatinine Clearance: 38.2 mL/min (A) (by C-G formula based on SCr of 1.12 mg/dL (H)). Recent Labs  Lab 04/01/23 2002 04/02/23 0459 04/03/23 0237 04/04/23 0429  WBC 6.2 7.1 6.2 6.4    Liver Function Tests: No results for input(s): "AST", "ALT", "ALKPHOS", "BILITOT", "PROT", "ALBUMIN" in the last 168 hours. No results for input(s): "LIPASE", "AMYLASE" in the last 168 hours. No results for input(s): "AMMONIA" in the last 168 hours.  ABG No results found for: "PHART", "PCO2ART", "PO2ART", "HCO3", "TCO2", "ACIDBASEDEF", "O2SAT"   Coagulation Profile: Recent Labs  Lab 04/01/23 2002 04/02/23 0039 04/02/23 0459 04/03/23 0901 04/04/23 0429  INR 2.9* 1.3* 1.2 1.0 1.1    Cardiac Enzymes: No results for input(s): "CKTOTAL", "CKMB", "CKMBINDEX", "TROPONINI" in the last 168 hours.  HbA1C: No results found for: "HGBA1C"  CBG: No results for input(s): "GLUCAP" in the last 168 hours.  Critical care time: n/a      Posey Boyer, MSN, NP, AG-ACNP-BC [facility] 04/04/2023, 10:06 AM  See Amion for pager If no response to pager , please call [phone] until 7pm After 7:00 pm call Elink  [phone]  I personally saw the patient and performed a substantive portion of this encounter, including a complete performance of at least one of the key components (MDM, Hx and/or Exam), in conjunction with the Advanced  Practice Provider.      No headache, complains of itching around left eye  On exam -alert, interactive, nonfocal, bruising on left side of face and periorbital, EOM normal, S1-S2 regular, clear breath sounds  Labs showed normal electrolytes, no leukocytosis, creatinine 1.1.  Impression/plan Traumatic IVH and trace SAH with orbital fracture, related to Coumadin and fall-seen by neurosurgery, repeat imaging only if neuroexam changes. -Follow-up with ENT 1 week after discharge  Syncope-echo shows moderate AS, transient SVT on review of telemetry -Await cardiology input  History of unprovoked DVT -DC Coumadin due to falls  Transfer to telemetry and to [facility] 8/7  Emrie Gayle V. Vassie Loll MD

## 2023-04-04 NOTE — Progress Notes (Signed)
Patient continues to do well.  Minimal headache.  No other neurologic symptoms.  She is awake and alert.  She is oriented and appropriate.  Motor and sensory function are intact.  Cranial nerve function normal bilateral.  Status post fall with intraventricular hemorrhage.  Patient doing well.  May mobilize ad lib.  Okay to transfer out of ICU and work towards discharge over the next couple days.  Syncope workup ongoing.

## 2023-04-05 ENCOUNTER — Encounter (HOSPITAL_COMMUNITY): Payer: Self-pay | Admitting: Internal Medicine

## 2023-04-05 DIAGNOSIS — I615 Nontraumatic intracerebral hemorrhage, intraventricular: Secondary | ICD-10-CM | POA: Diagnosis not present

## 2023-04-05 MED ORDER — GERHARDT'S BUTT CREAM
TOPICAL_CREAM | CUTANEOUS | Status: DC | PRN
  Filled 2023-04-05: qty 1

## 2023-04-05 NOTE — TOC Initial Note (Signed)
Transition of Care [facility]) - Initial/Assessment Note    Patient Details  Name: Carol Riley MRN: [id number] Date of Birth: 1941/02/01  [facility] CM/SW Contact:    Mearl Latin, LCSW Phone Number: 04/05/2023, 11:54 AM  Clinical Narrative:                 Patient admitted from home. TOC following for home health set up.   Expected Discharge Plan: Home w Home Health Services Barriers to Discharge: Continued Medical Work up   Patient Goals and CMS Choice            Expected Discharge Plan and Services   Discharge Planning Services: CM Consult Post Acute Care Choice: Home Health Living arrangements for the past 2 months: Single Family Home                                      Prior Living Arrangements/Services Living arrangements for the past 2 months: Single Family Home   Patient language and need for interpreter reviewed:: Yes        Need for Family Participation in Patient Care: Yes (Comment) Care giver support system in place?: Yes (comment)   Criminal Activity/Legal Involvement Pertinent to Current Situation/Hospitalization: No - Comment as needed  Activities of Daily Living      Permission Sought/Granted                  Emotional Assessment       Orientation: : Oriented to Self, Oriented to Place, Oriented to  Time, Oriented to Situation Alcohol / Substance Use: Not Applicable Psych Involvement: No (comment)  Admission diagnosis:  IVH (intraventricular hemorrhage) (HCC) [I61.5] Patient Active Problem List   Diagnosis Date Noted   Syncope 04/04/2023   ICH (intracerebral hemorrhage) (HCC) 04/02/2023   IVH (intraventricular hemorrhage) (HCC) 04/02/2023   Loss of consciousness associated with intracranial injury (HCC) 04/02/2023   Chronic anticoagulation 04/02/2023   Pain in limb 04/23/2019   Lymphedema 10/23/2018   Hypertension 07/13/2018   Coronary artery disease 07/13/2018   Edema 07/13/2018   Lower limb ulcer,  calf, left, with fat layer exposed (HCC) 07/13/2018   PCP:  Lynnea Ferrier, MD Pharmacy:   [phone] - [place], [place] - [address] [place] [address] Phone: [phone] Fax: [phone]     Social Determinants of Health (SDOH) Social History: SDOH Screenings   Tobacco Use: Medium Risk (04/01/2023)   SDOH Interventions:     Readmission Risk Interventions     No data to display

## 2023-04-05 NOTE — Progress Notes (Signed)
Overall looks good.  Minimal headaches.  No other neurologic symptoms.  Doing well from a neurosurgical standpoint.  May mobilize ad lib.  Okay for discharge home with follow-up with me in 2 weeks once cleared from a syncope standpoint.

## 2023-04-05 NOTE — Progress Notes (Signed)
Duplicate note done in error

## 2023-04-05 NOTE — Progress Notes (Signed)
PROGRESS NOTE    Carol Riley  ZOX:[id number] DOB: 1941/05/30 DOA: 04/02/2023 PCP: Lynnea Ferrier, MD   Brief Narrative:  82 year old female with past medical history of DVT on Coumadin, COPD, CAD was brought to [facility] by EMS after a fall at home and loss of consciousness.  On presentation, CT head revealed 8 mm IVH and CT maxillofacial revealed a displaced left orbital floor fracture.  She was given [place].  Patient was admitted to ICU at [facility] and neurosurgery was consulted.  Repeat CT head showed increased in size of intraventricular hemorrhage to 11 mm.  Patient was transferred to [facility] for need for possible ophthalmology evaluation.  During the hospitalization, neurosurgery recommended conservative management.  ENT/ophthalmology recommended outpatient follow-up.  Patient had paroxysmal A-fib with RVR for which cardiology was consulted.  She was transferred to [facility] service from 04/05/2023 onwards.  Assessment & Plan:   Fall with loss of consciousness Traumatic IVH inpatient on chronic anticoagulation Left orbital floor fracture in setting of above -Received Kcentra in the ED.  Initial IVH of 8 mm: Increased to 11 mm on repeat CT at [facility]. -Neurosurgery recommended conservative management: Not sure is okay for patient to discharge home with outpatient follow-up in 2 weeks with neurosurgery -Fall precautions.  Neurologically currently intact -PT following.  Will need home health PT.  Will have her work with PT again today and possibly tomorrow and possibly discharge home in the next 1 to 2 days if remains stable. -Repeat CT head for any neurological changes -Continue to hold anticoagulation and antiplatelets: Will not resume till reevaluation by neurosurgery as an outpatient -Will need outpatient follow-up within 1 week after discharge with ENT regarding orbital fracture (PCCM communicated with ENT and ophthalmology on 04/02/2023 who recommended conservative management at outpatient  follow-up  Hypertension Sinus arrhythmia; concern for paroxysmal A-fib with RVR Moderate aortic stenosis:?  New seen on echo Hyperlipidemia -Blood pressure currently stable.  Continue lisinopril and metoprolol succinate.  Continue simvastatin -Rate currently controlled.  Cardiology evaluation appreciated: Will need arrangement for outpatient 30-day cardiac monitor.  Outpatient follow-up with cardiology regarding follow-up of moderate aortic stenosis  History of DVT -Anticoagulation plan as above.  COPD Chronic respiratory failure with hypoxia -Respiratory status currently stable.  Currently on 2 L oxygen via nasal cannula -Continue as needed nebs  Thrombocytopenia -Resolved  Hypokalemia -Resolved.  No labs today  Hypomagnesemia -Resolved.  No labs today  Physical deconditioning -Will need home health PT   DVT prophylaxis: SCDs Code Status: DNR Family Communication: None at bedside Disposition Plan: Status is: Inpatient Remains inpatient appropriate because: Of severity of illness  Consultants: PCCM/neurosurgery/cardiology  Procedures: As above  Antimicrobials: None   Subjective: Patient seen and examined at bedside.  Denies worsening fever, nausea, vomiting.  No seizures reported.  Objective: Vitals:   04/05/23 0400 04/05/23 0500 04/05/23 0715 04/05/23 0752  BP: 135/60 132/69 135/63   Pulse: 83 83 82   Resp: 16 (!) 22 18   Temp: 98.3 F (36.8 C)   97.8 F (36.6 C)  TempSrc: Oral   Oral  SpO2: 96% 96% 97%   Weight:   77 kg   Height:        Intake/Output Summary (Last 24 hours) at 04/05/2023 1056 Last data filed at 04/05/2023 0715 Gross per 24 hour  Intake 590 ml  Output --  Net 590 ml   Filed Weights   04/03/23 0500 04/04/23 0721 04/05/23 0715  Weight: 77.3 kg 77 kg 77  kg    Examination:  General exam: Appears calm and comfortable.  On 2 L oxygen via nasal cannula.  Elderly female lying in bed. Respiratory system: Bilateral decreased breath  sounds at bases with some scattered crackles Cardiovascular system: S1 & S2 heard, Rate controlled Gastrointestinal system: Abdomen is nondistended, soft and nontender. Normal bowel sounds heard. Extremities: No cyanosis, clubbing; trace lower extremity edema present  Central nervous system: Alert and oriented.  Slow to respond.  Poor historian.  No focal neurological deficits. Moving extremities Skin: No rashes, lesions or ulcers Psychiatry: Flat affect.  Not agitated.   Data Reviewed: I have personally reviewed following labs and imaging studies  CBC: Recent Labs  Lab 04/01/23 2002 04/02/23 0459 04/03/23 0237 04/04/23 0429  WBC 6.2 7.1 6.2 6.4  NEUTROABS  --   --   --  3.4  HGB 14.6 14.3 13.3 13.7  HCT 41.9 41.1 38.4 39.9  MCV 93.5 93.0 92.1 93.2  PLT 149* 147* 147* 159   Basic Metabolic Panel: Recent Labs  Lab 04/01/23 2002 04/02/23 0459 04/02/23 0615 04/03/23 0230 04/03/23 0237 04/04/23 0429  NA 134* 136  --   --  135 135  K 3.5 3.5  --   --  3.3* 4.0  CL 99 99  --   --  92* 98  CO2 26 29  --   --  27 27  GLUCOSE 173* 155*  --   --  150* 136*  BUN 18 16  --   --  13 18  CREATININE 0.97 0.90  --   --  1.06* 1.12*  CALCIUM 9.0 9.0  --   --  9.1 9.3  MG  --  1.5* 1.3* 1.8  --  2.0  PHOS  --  3.3  --   --   --   --    GFR: Estimated Creatinine Clearance: 38.2 mL/min (A) (by C-G formula based on SCr of 1.12 mg/dL (H)). Liver Function Tests: No results for input(s): "AST", "ALT", "ALKPHOS", "BILITOT", "PROT", "ALBUMIN" in the last 168 hours. No results for input(s): "LIPASE", "AMYLASE" in the last 168 hours. No results for input(s): "AMMONIA" in the last 168 hours. Coagulation Profile: Recent Labs  Lab 04/01/23 2002 04/02/23 0039 04/02/23 0459 04/03/23 0901 04/04/23 0429  INR 2.9* 1.3* 1.2 1.0 1.1   Cardiac Enzymes: No results for input(s): "CKTOTAL", "CKMB", "CKMBINDEX", "TROPONINI" in the last 168 hours. BNP (last 3 results) No results for input(s):  "PROBNP" in the last 8760 hours. HbA1C: No results for input(s): "HGBA1C" in the last 72 hours. CBG: No results for input(s): "GLUCAP" in the last 168 hours. Lipid Profile: No results for input(s): "CHOL", "HDL", "LDLCALC", "TRIG", "CHOLHDL", "LDLDIRECT" in the last 72 hours. Thyroid Function Tests: No results for input(s): "TSH", "T4TOTAL", "FREET4", "T3FREE", "THYROIDAB" in the last 72 hours. Anemia Panel: No results for input(s): "VITAMINB12", "FOLATE", "FERRITIN", "TIBC", "IRON", "RETICCTPCT" in the last 72 hours. Sepsis Labs: No results for input(s): "PROCALCITON", "LATICACIDVEN" in the last 168 hours.  Recent Results (from the past 240 hour(s))  MRSA Next Gen by PCR, Nasal     Status: None   Collection Time: 04/02/23  5:54 AM   Specimen: Nasal Mucosa; Nasal Swab  Result Value Ref Range Status   MRSA by PCR Next Gen NOT DETECTED NOT DETECTED Final    Comment: (NOTE) The GeneXpert MRSA Assay (FDA approved for NASAL specimens only), is one component of a comprehensive MRSA colonization surveillance program. It is not intended to  diagnose MRSA infection nor to guide or monitor treatment for MRSA infections. Test performance is not FDA approved in patients less than 66 years old. Performed at [facility], [address], [place], [place] [address]          Radiology Studies: ECHOCARDIOGRAM COMPLETE  Result Date: 04/03/2023    ECHOCARDIOGRAM REPORT   Patient Name:   PAMULA KLEEN Kosek Date of Exam: 04/03/2023 Medical Rec #:  [id number]       Height:       64.0 in Accession #:    [id number]      Weight:       170.4 lb Date of Birth:  1941/05/20       BSA:          1.828 m Patient Age:    81 years        BP:           147/94 mmHg Patient Gender: F               HR:           76 bpm. Exam Location:  Inpatient Procedure: 2D Echo, Cardiac Doppler and Color Doppler Indications:    Syncope R55  History:        Patient has no prior history of Echocardiogram examinations.                  CAD, COPD, Signs/Symptoms:Shortness of Breath; Risk                 Factors:Hypertension and Sleep Apnea.  Sonographer:    Lucendia Herrlich Referring Phys: [phone] WHITNEY D HARRIS IMPRESSIONS  1. Left ventricular ejection fraction, by estimation, is 60 to 65%. The left ventricle has normal function. The left ventricle has no regional wall motion abnormalities. There is mild left ventricular hypertrophy. Left ventricular diastolic parameters are consistent with Grade I diastolic dysfunction (impaired relaxation).  2. Right ventricular systolic function is normal. The right ventricular size is normal.  3. No evidence of mitral valve regurgitation.  4. The aortic valve is calcified. Aortic valve regurgitation is not visualized. Moderate aortic valve stenosis.  5. The inferior vena cava is normal in size with greater than 50% respiratory variability, suggesting right atrial pressure of 3 mmHg. FINDINGS  Left Ventricle: Left ventricular ejection fraction, by estimation, is 60 to 65%. The left ventricle has normal function. The left ventricle has no regional wall motion abnormalities. The left ventricular internal cavity size was normal in size. There is  mild left ventricular hypertrophy. Left ventricular diastolic parameters are consistent with Grade I diastolic dysfunction (impaired relaxation). Right Ventricle: The right ventricular size is normal. Right ventricular systolic function is normal. Left Atrium: Left atrial size was normal in size. Right Atrium: Right atrial size was normal in size. Pericardium: There is no evidence of pericardial effusion. Mitral Valve: No evidence of mitral valve regurgitation. Tricuspid Valve: The tricuspid valve is normal in structure. Tricuspid valve regurgitation is mild. Aortic Valve: The aortic valve is calcified. Aortic valve regurgitation is not visualized. Moderate aortic stenosis is present. Aortic valve mean gradient measures 21.7 mmHg. Aortic valve peak gradient measures 37.7  mmHg. Aortic valve area, by VTI measures 1.11 cm. Pulmonic Valve: The pulmonic valve was normal in structure. Pulmonic valve regurgitation is trivial. Aorta: The aortic root and ascending aorta are structurally normal, with no evidence of dilitation. Venous: The inferior vena cava is normal in size with greater than 50% respiratory variability, suggesting  right atrial pressure of 3 mmHg. IAS/Shunts: No atrial level shunt detected by color flow Doppler.  LEFT VENTRICLE PLAX 2D LVIDd:         3.60 cm   Diastology LVIDs:         2.30 cm   LV e' medial:    6.53 cm/s LV PW:         1.10 cm   LV E/e' medial:  11.1 LV IVS:        1.10 cm   LV e' lateral:   5.75 cm/s LVOT diam:     2.10 cm   LV E/e' lateral: 12.6 LV SV:         72 LV SV Index:   39 LVOT Area:     3.46 cm  RIGHT VENTRICLE             IVC RV S prime:     14.60 cm/s  IVC diam: 1.60 cm TAPSE (M-mode): 2.5 cm LEFT ATRIUM             Index        RIGHT ATRIUM           Index LA diam:        3.90 cm 2.13 cm/m   RA Area:     10.70 cm LA Vol (A2C):   38.0 ml 20.76 ml/m  RA Volume:   21.20 ml  11.60 ml/m LA Vol (A4C):   43.6 ml 23.86 ml/m LA Biplane Vol: 45.4 ml 24.84 ml/m  AORTIC VALVE AV Area (Vmax):    1.15 cm AV Area (Vmean):   1.11 cm AV Area (VTI):     1.11 cm AV Vmax:           307.00 cm/s AV Vmean:          215.667 cm/s AV VTI:            0.648 m AV Peak Grad:      37.7 mmHg AV Mean Grad:      21.7 mmHg LVOT Vmax:         101.77 cm/s LVOT Vmean:        69.067 cm/s LVOT VTI:          0.208 m LVOT/AV VTI ratio: 0.32  AORTA Ao Root diam: 3.20 cm Ao Asc diam:  3.00 cm MITRAL VALVE                TRICUSPID VALVE MV Area (PHT): 2.95 cm     TR Peak grad:   39.4 mmHg MV Decel Time: 257 msec     TR Vmax:        314.00 cm/s MV E velocity: 72.60 cm/s MV A velocity: 109.00 cm/s  SHUNTS MV E/A ratio:  0.67         Systemic VTI:  0.21 m                             Systemic Diam: 2.10 cm Carolan Clines Electronically signed by Carolan Clines Signature Date/Time:  04/03/2023/11:21:12 AM    Final         Scheduled Meds:  Chlorhexidine Gluconate Cloth  6 each Topical Daily   guaiFENesin  600 mg Oral BID   lisinopril  40 mg Oral Daily   metoprolol succinate  12.5 mg Oral Daily   simvastatin  20 mg Oral q1800   Continuous Infusions:        Armonie Mettler  Hanley Ben, MD [facility] 04/05/2023, 10:56 AM

## 2023-04-05 NOTE — Progress Notes (Signed)
Occupational Therapy Treatment Patient Details Name: Carol Riley MRN: [id number] DOB: [birth year] Today's Date: 04/05/2023   History of present illness The pt is an 82 yo female presenting to [facility] on 8/3 after a fall at home. Pt found to have acute IVH of L lateral ventrical which increased from 8mm to 11mm on repeat CT and comminuted and displaced depressed left orbital floor fracture, pt transferred to [facility]. PMH includes: COPD, HTN, OSA, PVD, CAD, cervical cancer, and DVT on anticoagulation.   OT comments  Patient progressing well towards OT goals.  Completing bed mobility with supervision (mod assist to return back to supine but reports sleeping in recliner at home), transfers with supervision using RW and ADLs with supervision.  She requires intermittent cueing for safety, does demonstrate mild decreased short term memory on recall task and difficulty with dual cognitive tasks but able to attend and problem solve without cueing.  Discussed recommendations to have son assist initially with medications, as well as using a pill box with multiple time slots/day- pt agreeable.  Will follow acutely, continued to recommend home health services.       If plan is discharge home, recommend the following:  A little help with bathing/dressing/bathroom;Assistance with cooking/housework;Assist for transportation;Help with stairs or ramp for entrance;Direct supervision/assist for medications management;Direct supervision/assist for financial management   Equipment Recommendations  None recommended by OT    Recommendations for Other Services      Precautions / Restrictions Precautions Precautions: Fall Precaution Comments: admitted after fall Restrictions Weight Bearing Restrictions: No       Mobility Bed Mobility Overal bed mobility: Needs Assistance Bed Mobility: Supine to Sit, Sit to Supine     Supine to sit: Supervision Sit to supine: Mod assist   General bed mobility comments:  assist to bring B LEs back to bed, reports she sleeps in a recliner at home    Transfers Overall transfer level: Needs assistance Equipment used: Rolling walker (2 wheels) Transfers: Sit to/from Stand Sit to Stand: Supervision           General transfer comment: cueing for hand placement, safety     Balance Overall balance assessment: Needs assistance Sitting-balance support: No upper extremity supported, Feet supported Sitting balance-Leahy Scale: Good     Standing balance support: Bilateral upper extremity supported, No upper extremity supported, During functional activity Standing balance-Leahy Scale: Fair                             ADL either performed or assessed with clinical judgement   ADL Overall ADL's : Needs assistance/impaired     Grooming: Supervision/safety;Wash/dry hands;Standing           Upper Body Dressing : Set up;Sitting       Toilet Transfer: Supervision/safety;Ambulation;Rolling walker (2 wheels);Grab bars   Toileting- Clothing Manipulation and Hygiene: Supervision/safety;Sit to/from stand       Functional mobility during ADLs: Supervision/safety;Rolling walker (2 wheels)      Extremity/Trunk Assessment              Vision       Perception     Praxis      Cognition Arousal: Alert Behavior During Therapy: WFL for tasks assessed/performed Overall Cognitive Status: Impaired/Different from baseline Area of Impairment: Memory                     Memory: Decreased short-term memory  General Comments: engaged in parts of short blessed test, pt able to sequencing and attend to task but demonstrates slight decreased STM with recall of name/address.        Exercises      Shoulder Instructions       General Comments pt on RA during session with SPO2 maintained, replaced 2L O2 at end of session; discussed with pt about use of pill box, having assist initally for safety and using pill box with  multiple time slots    Pertinent Vitals/ Pain       Pain Assessment Pain Assessment: No/denies pain  Home Living                                          Prior Functioning/Environment              Frequency  Min 1X/week        Progress Toward Goals  OT Goals(current goals can now be found in the care plan section)  Progress towards OT goals: Progressing toward goals  Acute Rehab OT Goals Patient Stated Goal: home OT Goal Formulation: With patient Time For Goal Achievement: 04/17/23 Potential to Achieve Goals: Good  Plan      Co-evaluation                 AM-PAC OT "6 Clicks" Daily Activity     Outcome Measure   Help from another person eating meals?: A Little Help from another person taking care of personal grooming?: A Little Help from another person toileting, which includes using toliet, bedpan, or urinal?: A Little Help from another person bathing (including washing, rinsing, drying)?: A Little Help from another person to put on and taking off regular upper body clothing?: A Little Help from another person to put on and taking off regular lower body clothing?: A Little 6 Click Score: 18    End of Session Equipment Utilized During Treatment: Rolling walker (2 wheels)  OT Visit Diagnosis: Other abnormalities of gait and mobility (R26.89);Muscle weakness (generalized) (M62.81);History of falling (Z91.81)   Activity Tolerance Patient tolerated treatment well   Patient Left in bed;with call bell/phone within reach;with SCD's reapplied   Nurse Communication Mobility status        Time: 8295-6213 OT Time Calculation (min): 30 min  Charges: OT General Charges $OT Visit: 1 Visit OT Treatments $Self Care/Home Management : 23-37 mins  Barry Brunner, OT Acute Rehabilitation Services Office [phone]   Chancy Milroy 04/05/2023, 11:22 AM

## 2023-04-06 ENCOUNTER — Inpatient Hospital Stay (HOSPITAL_BASED_OUTPATIENT_CLINIC_OR_DEPARTMENT_OTHER)
Admit: 2023-04-06 | Discharge: 2023-04-06 | Disposition: A | Discharge status: Home / Self Care | Payer: Medicare HMO | Attending: Cardiology | Admitting: Cardiology

## 2023-04-06 DIAGNOSIS — I615 Nontraumatic intracerebral hemorrhage, intraventricular: Secondary | ICD-10-CM | POA: Diagnosis not present

## 2023-04-06 DIAGNOSIS — R55 Syncope and collapse: Secondary | ICD-10-CM | POA: Diagnosis not present

## 2023-04-06 NOTE — Care Management Important Message (Signed)
Important Message  Patient Details  Name: Carol Riley MRN: [id number] Date of Birth: Oct 28, 1940   Medicare Important Message Given:  Yes     Deforrest Bogle 04/06/2023, 1:32 PM

## 2023-04-06 NOTE — Progress Notes (Signed)
Physical Therapy Treatment Patient Details Name: Carol Riley MRN: [id number] DOB: 1940-12-09 Today's Date: 04/06/2023   History of Present Illness The pt is an 82 yo female presenting to [facility] on 8/3 after a fall at home. Pt found to have acute IVH of L lateral ventrical which increased from 8mm to 11mm on repeat CT and comminuted and displaced depressed left orbital floor fracture, pt transferred to [facility]. PMH includes: COPD, HTN, OSA, PVD, CAD, cervical cancer, and DVT on anticoagulation.    PT Comments  Tolerated treatment well, eager to go home. Ambulating with RW >200 feet with supervision, no LOB. Navigates stairs with CGA - states sons will be able to assist at d/c. All questions answered. Reviewed safety awareness and RW use. HHPT for continued progression of independence. Will follow until d/c.    If plan is discharge home, recommend the following: A little help with walking and/or transfers;A little help with bathing/dressing/bathroom;Assistance with cooking/housework;Assist for transportation;Help with stairs or ramp for entrance   Can travel by private vehicle        Equipment Recommendations  Rolling walker (2 wheels)    Recommendations for Other Services       Precautions / Restrictions Precautions Precautions: Fall Precaution Comments: admitted after fall Restrictions Weight Bearing Restrictions: No     Mobility  Bed Mobility               General bed mobility comments: in recliner    Transfers Overall transfer level: Needs assistance Equipment used: Rolling walker (2 wheels) Transfers: Sit to/from Stand Sit to Stand: Supervision           General transfer comment: Supervision for safety, slow but stable. No assist needed    Ambulation/Gait Ambulation/Gait assistance: Supervision Gait Distance (Feet): 280 Feet Assistive device: Rolling walker (2 wheels) Gait Pattern/deviations: Decreased stride length, Step-through pattern, Trunk  flexed Gait velocity: decreased Gait velocity interpretation: <1.8 ft/sec, indicate of risk for recurrent falls   General Gait Details: Supervision for safety, stable with RW for support, a bit guarded, no buckling. Cues for upright posture within RW.   Stairs Stairs: Yes Stairs assistance: Contact guard assist Stair Management: No rails, Step to pattern, Forwards, With walker Number of Stairs: 2 (x2) General stair comments: Practiced various techniques for navigating stairs. Pt reports sons will be able to help. RW utilized similar to home set-up without rails. CGA for safety wtihout overt LOB.   Wheelchair Mobility     Tilt Bed    Modified Rankin (Stroke Patients Only) Modified Rankin (Stroke Patients Only) Pre-Morbid Rankin Score: No symptoms Modified Rankin: Moderately severe disability     Balance Overall balance assessment: Needs assistance Sitting-balance support: No upper extremity supported, Feet supported Sitting balance-Leahy Scale: Good     Standing balance support: During functional activity, Single extremity supported Standing balance-Leahy Scale: Poor                              Cognition Arousal: Alert Behavior During Therapy: WFL for tasks assessed/performed Overall Cognitive Status: Within Functional Limits for tasks assessed                                          Exercises      General Comments General comments (skin integrity, edema, etc.): Reviewed safety and symptom awareness, RW use, assist on  stairs.      Pertinent Vitals/Pain Pain Assessment Pain Assessment: No/denies pain Pain Intervention(s): Monitored during session    Home Living                          Prior Function            PT Goals (current goals can now be found in the care plan section) Acute Rehab PT Goals Patient Stated Goal: return home PT Goal Formulation: With patient Time For Goal Achievement: 04/16/23 Potential to  Achieve Goals: Good Progress towards PT goals: Progressing toward goals    Frequency    Min 1X/week      PT Plan Current plan remains appropriate    Co-evaluation              AM-PAC PT "6 Clicks" Mobility   Outcome Measure  Help needed turning from your back to your side while in a flat bed without using bedrails?: A Little Help needed moving from lying on your back to sitting on the side of a flat bed without using bedrails?: A Little Help needed moving to and from a bed to a chair (including a wheelchair)?: A Little Help needed standing up from a chair using your arms (e.g., wheelchair or bedside chair)?: A Little Help needed to walk in hospital room?: A Little Help needed climbing 3-5 steps with a railing? : A Little 6 Click Score: 18    End of Session Equipment Utilized During Treatment: Gait belt Activity Tolerance: Patient tolerated treatment well Patient left: with call bell/phone within reach;in chair;with chair alarm set   PT Visit Diagnosis: Other abnormalities of gait and mobility (R26.89);Unsteadiness on feet (R26.81)     Time: 0272-5366 PT Time Calculation (min) (ACUTE ONLY): 16 min  Charges:    $Gait Training: 8-22 mins PT General Charges $$ ACUTE PT VISIT: 1 Visit                     Kathlyn Sacramento, PT, DPT [facility]  Rehabilitation Services Physical Therapist Office: [phone] Website: [email or web address]    Berton Mount 04/06/2023, 1:11 PM

## 2023-04-06 NOTE — Discharge Summary (Signed)
Physician Discharge Summary  Carol Riley:[id number] DOB: Jan 07, 1941 DOA: 04/02/2023  PCP: Lynnea Ferrier, MD  Admit date: 04/02/2023 Discharge date: 04/06/2023  Admitted From: Home Disposition: Home  Recommendations for Outpatient Follow-up:  Follow up with PCP in 1 week with repeat CBC/BMP Outpatient follow-up with neurosurgery, ENT and cardiology Follow up in ED if symptoms worsen or new appear   Home Health: No Equipment/Devices: None  Discharge Condition: Stable CODE STATUS: Full Diet recommendation: Heart healthy  Brief/Interim Summary: 82 year old female with past medical history of DVT on Coumadin, COPD, CAD was brought to [facility] by EMS after a fall at home and loss of consciousness. On presentation, CT head revealed 8 mm IVH and CT maxillofacial revealed a displaced left orbital floor fracture. She was given [place]. Patient was admitted to ICU at [facility] and neurosurgery was consulted. Repeat CT head showed increased in size of intraventricular hemorrhage to 11 mm. Patient was transferred to [facility] for need for possible ophthalmology evaluation. During the hospitalization, neurosurgery recommended conservative management. ENT/ophthalmology recommended outpatient follow-up. Patient had paroxysmal A-fib with RVR for which cardiology was consulted. She was transferred to [facility] service from 04/05/2023 onwards. Subsequently, she has tolerated physical therapy.  She feels okay to go home today.  She will be discharged home today with outpatient follow-up with PCP/neurosurgery/ENT and cardiology.  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  Initial IVH of 8 mm: Increased to 11 mm on repeat CT at [facility] -Neurosurgery recommended conservative management: Neurosurgery is okay for patient to discharge home with outpatient follow-up in 2 weeks  with neurosurgery -Neurologically currently intact -PT following.  Will need home health PT.   -Repeat CT head for any neurological changes -Continue to hold anticoagulation and antiplatelets: Will not resume till reevaluation by neurosurgery as an outpatient -Will need outpatient follow-up within 1 week after discharge with ENT regarding orbital fracture (PCCM communicated with ENT and ophthalmology on 04/02/2023 who recommended conservative management at outpatient follow-up) -Discharge patient home today.  Outpatient follow-up with PCP/neurosurgery/ENT   Hypertension Sinus arrhythmia; concern for paroxysmal A-fib with RVR Moderate aortic stenosis:?  New seen on echo Hyperlipidemia -Blood pressure currently stable.  Continue lisinopril and metoprolol succinate.  Continue simvastatin -Rate currently controlled.  Cardiology evaluation appreciated: Cardiology will arrange for 30-day cardiac monitor.  Outpatient follow-up with cardiology regarding follow-up of moderate aortic stenosis   History of DVT -Anticoagulation plan as above.   COPD Chronic respiratory failure with hypoxia -Respiratory status currently stable.  Currently on 2 L oxygen via nasal cannula -Continue as needed nebs   Thrombocytopenia -Resolved   Hypokalemia -Resolved.  No labs today   Hypomagnesemia -Resolved.  No labs today   Physical deconditioning -Will need home health PT and OT  Obesity -Outpatient follow-up  Discharge Instructions  Discharge Instructions     Ambulatory referral to Cardiology   Complete by: As directed    Diet - low sodium heart healthy   Complete by: As directed    Increase activity slowly   Complete by: As directed    No wound care   Complete by: As directed       Allergies as of 04/06/2023       Reactions   Pneumovax [pneumococcal Polysaccharide Vaccine] Swelling   Redness        Medication List     STOP taking these medications  aspirin EC 81 MG tablet    gabapentin 300 MG capsule Commonly known as: NEURONTIN       TAKE these medications    citalopram 20 MG tablet Commonly known as: CELEXA Take 20 mg by mouth daily.   clobetasol ointment 0.05 % Commonly known as: TEMOVATE Apply 1 Application topically as directed.  APPLY FINGERTIP SIZE AMOUNT TO AFFECTED AREA 2-3 TIMES WEEKLY FOR MAINTENANCE   hydrocortisone cream 1 % Apply 1 application topically daily as needed for itching.   lisinopril 40 MG tablet Commonly known as: ZESTRIL Take 40 mg by mouth daily.   metoprolol succinate 25 MG 24 hr tablet Commonly known as: TOPROL-XL Take 12.5 mg by mouth daily.   multivitamin tablet Take 1 tablet by mouth daily.   simvastatin 20 MG tablet Commonly known as: ZOCOR Take 20 mg by mouth at bedtime.   VITAMIN D3 PO Take 1 tablet by mouth daily.        Follow-up Information     Curtis Sites III, MD. Schedule an appointment as soon as possible for a visit in 1 week(s).   Specialty: Internal Medicine Contact information: [address] [facility] [place] [address] [phone]         Julio Sicks, MD. Schedule an appointment as soon as possible for a visit in 1 week(s).   Specialty: Neurosurgery Contact information: [address] [place] [address] [phone]         Suzanna Obey, MD. Schedule an appointment as soon as possible for a visit in 1 week(s).   Specialty: Otolaryngology Contact information: [address] [place] [address] [phone]                Allergies  Allergen Reactions   Pneumovax [Pneumococcal Polysaccharide Vaccine] Swelling    Redness    Consultations: PCCM/neurosurgery/cardiology   Procedures/Studies: ECHOCARDIOGRAM COMPLETE  Result Date: 04/03/2023    ECHOCARDIOGRAM REPORT   Patient Name:   Carol Riley Date of Exam: 04/03/2023 Medical Rec #:  [id number]       Height:       64.0 in Accession #:    [id number]       Weight:       170.4 lb Date of Birth:  December 16, 1940       BSA:          1.828 m Patient Age:    81 years        BP:           147/94 mmHg Patient Gender: F               HR:           76 bpm. Exam Location:  Inpatient Procedure: 2D Echo, Cardiac Doppler and Color Doppler Indications:    Syncope R55  History:        Patient has no prior history of Echocardiogram examinations.                 CAD, COPD, Signs/Symptoms:Shortness of Breath; Risk                 Factors:Hypertension and Sleep Apnea.  Sonographer:    Lucendia Herrlich Referring Phys: [phone] WHITNEY D HARRIS IMPRESSIONS  1. Left ventricular ejection fraction, by estimation, is 60 to 65%. The left ventricle has normal function. The left ventricle has no regional wall motion abnormalities. There is mild left ventricular hypertrophy. Left ventricular diastolic parameters are  consistent with Grade I diastolic dysfunction (impaired relaxation).  2. Right ventricular systolic function is normal. The right ventricular size is normal.  3. No evidence of mitral valve regurgitation.  4. The aortic valve is calcified. Aortic valve regurgitation is not visualized. Moderate aortic valve stenosis.  5. The inferior vena cava is normal in size with greater than 50% respiratory variability, suggesting right atrial pressure of 3 mmHg. FINDINGS  Left Ventricle: Left ventricular ejection fraction, by estimation, is 60 to 65%. The left ventricle has normal function. The left ventricle has no regional wall motion abnormalities. The left ventricular internal cavity size was normal in size. There is  mild left ventricular hypertrophy. Left ventricular diastolic parameters are consistent with Grade I diastolic dysfunction (impaired relaxation). Right Ventricle: The right ventricular size is normal. Right ventricular systolic function is normal. Left Atrium: Left atrial size was normal in size. Right Atrium: Right atrial size was normal in size. Pericardium: There is no evidence of  pericardial effusion. Mitral Valve: No evidence of mitral valve regurgitation. Tricuspid Valve: The tricuspid valve is normal in structure. Tricuspid valve regurgitation is mild. Aortic Valve: The aortic valve is calcified. Aortic valve regurgitation is not visualized. Moderate aortic stenosis is present. Aortic valve mean gradient measures 21.7 mmHg. Aortic valve peak gradient measures 37.7 mmHg. Aortic valve area, by VTI measures 1.11 cm. Pulmonic Valve: The pulmonic valve was normal in structure. Pulmonic valve regurgitation is trivial. Aorta: The aortic root and ascending aorta are structurally normal, with no evidence of dilitation. Venous: The inferior vena cava is normal in size with greater than 50% respiratory variability, suggesting right atrial pressure of 3 mmHg. IAS/Shunts: No atrial level shunt detected by color flow Doppler.  LEFT VENTRICLE PLAX 2D LVIDd:         3.60 cm   Diastology LVIDs:         2.30 cm   LV e' medial:    6.53 cm/s LV PW:         1.10 cm   LV E/e' medial:  11.1 LV IVS:        1.10 cm   LV e' lateral:   5.75 cm/s LVOT diam:     2.10 cm   LV E/e' lateral: 12.6 LV SV:         72 LV SV Index:   39 LVOT Area:     3.46 cm  RIGHT VENTRICLE             IVC RV S prime:     14.60 cm/s  IVC diam: 1.60 cm TAPSE (M-mode): 2.5 cm LEFT ATRIUM             Index        RIGHT ATRIUM           Index LA diam:        3.90 cm 2.13 cm/m   RA Area:     10.70 cm LA Vol (A2C):   38.0 ml 20.76 ml/m  RA Volume:   21.20 ml  11.60 ml/m LA Vol (A4C):   43.6 ml 23.86 ml/m LA Biplane Vol: 45.4 ml 24.84 ml/m  AORTIC VALVE AV Area (Vmax):    1.15 cm AV Area (Vmean):   1.11 cm AV Area (VTI):     1.11 cm AV Vmax:           307.00 cm/s AV Vmean:          215.667 cm/s AV VTI:  0.648 m AV Peak Grad:      37.7 mmHg AV Mean Grad:      21.7 mmHg LVOT Vmax:         101.77 cm/s LVOT Vmean:        69.067 cm/s LVOT VTI:          0.208 m LVOT/AV VTI ratio: 0.32  AORTA Ao Root diam: 3.20 cm Ao Asc diam:  3.00  cm MITRAL VALVE                TRICUSPID VALVE MV Area (PHT): 2.95 cm     TR Peak grad:   39.4 mmHg MV Decel Time: 257 msec     TR Vmax:        314.00 cm/s MV E velocity: 72.60 cm/s MV A velocity: 109.00 cm/s  SHUNTS MV E/A ratio:  0.67         Systemic VTI:  0.21 m                             Systemic Diam: 2.10 cm Carolan Clines Electronically signed by Carolan Clines Signature Date/Time: 04/03/2023/11:21:12 AM    Final    CT HEAD WO CONTRAST ( )  Result Date: 04/03/2023 CLINICAL DATA:  Follow-up examination for intracranial hemorrhage. EXAM: CT HEAD WITHOUT CONTRAST TECHNIQUE: Contiguous axial images were obtained from the base of the skull through the vertex without intravenous contrast. RADIATION DOSE REDUCTION: This exam was performed according to the departmental dose-optimization program which includes automated exposure control, adjustment of the mA and/or kV according to patient size and/or use of iterative reconstruction technique. COMPARISON:  Prior CT from 04/02/2023. FINDINGS: Brain: There has been continued interval increase in volume of intraventricular hemorrhage, with increased blood now seen involving the left greater than right lateral ventricles. Ventricular size is overall not significantly changed without hydrocephalus. No more than trace residual subarachnoid hemorrhage at the left sylvian fissure. No other new acute intracranial hemorrhage. No acute large vessel territory infarct. No mass lesion or midline shift. No extra-axial fluid collection. Atrophy with chronic small vessel ischemic disease again noted. Vascular: No abnormal hyperdense vessel. Calcified atherosclerosis present at the skull base. Skull: Evolving left-sided scalp contusion.  Calvarium intact. Sinuses/Orbits: Left-sided facial fractures with associated left maxillary hemosinus, partially visualized. Findings described on prior maxillofacial CT. No new finding. Other: No significant mastoid effusion. IMPRESSION: 1. Continued  interval increase in volume of intraventricular hemorrhage, with increased blood now seen involving the left greater than right lateral ventricles. Ventricular size is overall not significantly changed without hydrocephalus. 2. No more than trace residual subarachnoid hemorrhage at the left Sylvian fissure. 3. No other new acute intracranial abnormality. Electronically Signed   By: Rise Mu M.D.   On: 04/03/2023 00:36   CT HEAD WO CONTRAST ( )  Result Date: 04/02/2023 CLINICAL DATA:  IVH EXAM: CT HEAD WITHOUT CONTRAST TECHNIQUE: Contiguous axial images were obtained from the base of the skull through the vertex without intravenous contrast. RADIATION DOSE REDUCTION: This exam was performed according to the departmental dose-optimization program which includes automated exposure control, adjustment of the mA and/or kV according to patient size and/or use of iterative reconstruction technique. COMPARISON:  CT head 04/02/23 FINDINGS: Brain: Progressive increase in size intraventricular blood products, now with increased size of the blood products along the septum pellucidum measuring 1.4 x 1.1 cm, previously 1.1 x 0.9 cm. There is also increased blood products in the left occipital horn.  There is interval decrease in subarachnoid blood products in the left sylvian fissure. No hydrocephalus. No extra-axial fluid collection. No CT evidence of an acute cortical infarct. Sequela of moderate to severe chronic microvascular ischemic change. Vascular: No hyperdense vessel or unexpected calcification. Skull: Soft hematoma along the left frontal scalp, unchanged from prior exam. No evidence of underlying calvarial fracture. Sinuses/Orbits: No middle ear or mastoid effusion. Near-complete opacification of the left maxillary sinus. Left-sided facial bone fractures are better described on prior facial bone CT. Bilateral lens replacement. Other: None. IMPRESSION: Progressive increase in volume of intraventricular  blood products, now with increased volume of the blood products along the septum pellucidum measuring 1.4 x 1.1 cm, previously 1.1 x 0.9 cm. There is also increased layering blood products in the left occipital horn. No hydrocephalus Electronically Signed   By: Lorenza Cambridge M.D.   On: 04/02/2023 12:09   CT Head Wo Contrast  Result Date: 04/02/2023 CLINICAL DATA:  Follow-up examination for intracranial hemorrhage. EXAM: CT HEAD WITHOUT CONTRAST TECHNIQUE: Contiguous axial images were obtained from the base of the skull through the vertex without intravenous contrast. RADIATION DOSE REDUCTION: This exam was performed according to the departmental dose-optimization program which includes automated exposure control, adjustment of the mA and/or kV according to patient size and/or use of iterative reconstruction technique. COMPARISON:  CT from 04/01/2023. FINDINGS: Brain: Interval increase in size of small volume intraventricular hemorrhage within the left lateral ventricle, now measuring up to 11 mm. Trace layering blood present within the left occipital horn. Trace subarachnoid hemorrhage now seen at the left sylvian fissure. No other acute intracranial hemorrhage. No acute large vessel territory infarct. No mass lesion or midline shift. No hydrocephalus or significant extra-axial fluid collection. Vascular: No abnormal hyperdense vessel. Scattered vascular calcifications noted within the carotid siphons. Skull: Evolving left periorbital and facial contusion. Calvarium demonstrates no new finding. Sinuses/Orbits: Left-sided facial fracture with associated left maxillary hemosinus. Globes and orbital soft tissues demonstrate no other acute finding. Trace left mastoid effusion noted. Other: None. IMPRESSION: 1. Interval increase in size of small volume intraventricular hemorrhage within the left lateral ventricle, now measuring up to 11 mm. 2. Trace subarachnoid hemorrhage at the left Sylvian fissure. 3. Evolving left  periorbital and facial contusion. Left-sided facial fractures described on prior exam. Electronically Signed   By: Rise Mu M.D.   On: 04/02/2023 02:35   CT Head Wo Contrast  Addendum Date: 04/01/2023   ADDENDUM REPORT: 04/01/2023 22:01 ADDENDUM: These results were called by telephone at the time of interpretation on 04/01/2023 at 10:01 pm to provider MARK QUALE , who verbally acknowledged these results. Electronically Signed   By: Tish Frederickson M.D.   On: 04/01/2023 22:01   Result Date: 04/01/2023 CLINICAL DATA:  Facial trauma, blunt; Neck trauma (Age >= 65y) EXAM: CT HEAD WITHOUT CONTRAST CT MAXILLOFACIAL WITHOUT CONTRAST CT CERVICAL SPINE WITHOUT CONTRAST TECHNIQUE: Multidetector CT imaging of the head, cervical spine, and maxillofacial structures were performed using the standard protocol without intravenous contrast. Multiplanar CT image reconstructions of the cervical spine and maxillofacial structures were also generated. RADIATION DOSE REDUCTION: This exam was performed according to the departmental dose-optimization program which includes automated exposure control, adjustment of the mA and/or kV according to patient size and/or use of iterative reconstruction technique. COMPARISON:  CT head 06/17/2018 FINDINGS: CT HEAD FINDINGS Brain: Cerebral ventricle sizes are concordant with the degree of cerebral volume loss. Patchy and confluent areas of decreased attenuation are noted throughout the deep and periventricular  white matter of the cerebral hemispheres bilaterally, compatible with chronic microvascular ischemic disease. No evidence of large-territorial acute infarction. No intraparenchymal hemorrhage. No mass lesion. No extra-axial collection. No mass effect or midline shift. An 8 mm rounded hyperdensity within the frontal horn of the left lateral ventricle.No hydrocephalus. Basilar cisterns are patent. Vascular: No hyperdense vessel. Atherosclerotic calcifications are present within the  cavernous internal carotid arteries. Skull: No acute fracture or focal lesion. Other: Left frontal scalp hematoma formation. CT MAXILLOFACIAL FINDINGS Osseous: Comminuted and displaced 3 mm depressed left orbital floor fracture. Sinuses/Orbits: Blood products partially opacified left maxillary sinus. Paranasal sinuses and mastoid air cells are clear. Bilateral lens replacement. Otherwise the orbits are unremarkable. Soft tissues: Left maxillary subcutaneus soft tissue hematoma formation. Left periorbital subcutaneus soft tissue hematoma formation. CT CERVICAL SPINE FINDINGS Alignment: Normal. Skull base and vertebrae: Multilevel moderate severe degenerative changes of the spine with associated multilevel posterior disc osteophyte complex formation. At least moderate to severe osseous neural foraminal stenosis at the C3-C4 level, right C4 - C5 level. No severe osseous central canal stenosis. No acute fracture. No aggressive appearing focal osseous lesion or focal pathologic process. Soft tissues and spinal canal: No prevertebral fluid or swelling. No visible canal hematoma. Upper chest: Unremarkable. Other: None. IMPRESSION: 1. An 8 mm rounded hyperdensity within the frontal horn of the left lateral ventricle suggestive of blood products and acute intraventricular hemorrhage. No associated hydrocephalus. Differential diagnosis includes a mass. 2. Comminuted and displaced 3 mm depressed left orbital floor fracture. Correlate with physical exam for extraocular muscle entrapment. 3. No acute displaced fracture or traumatic listhesis of the cervical spine. Electronically Signed: By: Tish Frederickson M.D. On: 04/01/2023 21:55   CT Maxillofacial Wo Contrast  Addendum Date: 04/01/2023   ADDENDUM REPORT: 04/01/2023 22:01 ADDENDUM: These results were called by telephone at the time of interpretation on 04/01/2023 at 10:01 pm to provider MARK QUALE , who verbally acknowledged these results. Electronically Signed   By: Tish Frederickson M.D.   On: 04/01/2023 22:01   Result Date: 04/01/2023 CLINICAL DATA:  Facial trauma, blunt; Neck trauma (Age >= 65y) EXAM: CT HEAD WITHOUT CONTRAST CT MAXILLOFACIAL WITHOUT CONTRAST CT CERVICAL SPINE WITHOUT CONTRAST TECHNIQUE: Multidetector CT imaging of the head, cervical spine, and maxillofacial structures were performed using the standard protocol without intravenous contrast. Multiplanar CT image reconstructions of the cervical spine and maxillofacial structures were also generated. RADIATION DOSE REDUCTION: This exam was performed according to the departmental dose-optimization program which includes automated exposure control, adjustment of the mA and/or kV according to patient size and/or use of iterative reconstruction technique. COMPARISON:  CT head 06/17/2018 FINDINGS: CT HEAD FINDINGS Brain: Cerebral ventricle sizes are concordant with the degree of cerebral volume loss. Patchy and confluent areas of decreased attenuation are noted throughout the deep and periventricular white matter of the cerebral hemispheres bilaterally, compatible with chronic microvascular ischemic disease. No evidence of large-territorial acute infarction. No intraparenchymal hemorrhage. No mass lesion. No extra-axial collection. No mass effect or midline shift. An 8 mm rounded hyperdensity within the frontal horn of the left lateral ventricle.No hydrocephalus. Basilar cisterns are patent. Vascular: No hyperdense vessel. Atherosclerotic calcifications are present within the cavernous internal carotid arteries. Skull: No acute fracture or focal lesion. Other: Left frontal scalp hematoma formation. CT MAXILLOFACIAL FINDINGS Osseous: Comminuted and displaced 3 mm depressed left orbital floor fracture. Sinuses/Orbits: Blood products partially opacified left maxillary sinus. Paranasal sinuses and mastoid air cells are clear. Bilateral lens replacement. Otherwise the orbits are  unremarkable. Soft tissues: Left maxillary  subcutaneus soft tissue hematoma formation. Left periorbital subcutaneus soft tissue hematoma formation. CT CERVICAL SPINE FINDINGS Alignment: Normal. Skull base and vertebrae: Multilevel moderate severe degenerative changes of the spine with associated multilevel posterior disc osteophyte complex formation. At least moderate to severe osseous neural foraminal stenosis at the C3-C4 level, right C4 - C5 level. No severe osseous central canal stenosis. No acute fracture. No aggressive appearing focal osseous lesion or focal pathologic process. Soft tissues and spinal canal: No prevertebral fluid or swelling. No visible canal hematoma. Upper chest: Unremarkable. Other: None. IMPRESSION: 1. An 8 mm rounded hyperdensity within the frontal horn of the left lateral ventricle suggestive of blood products and acute intraventricular hemorrhage. No associated hydrocephalus. Differential diagnosis includes a mass. 2. Comminuted and displaced 3 mm depressed left orbital floor fracture. Correlate with physical exam for extraocular muscle entrapment. 3. No acute displaced fracture or traumatic listhesis of the cervical spine. Electronically Signed: By: Tish Frederickson M.D. On: 04/01/2023 21:55   CT Cervical Spine Wo Contrast  Addendum Date: 04/01/2023   ADDENDUM REPORT: 04/01/2023 22:01 ADDENDUM: These results were called by telephone at the time of interpretation on 04/01/2023 at 10:01 pm to provider MARK QUALE , who verbally acknowledged these results. Electronically Signed   By: Tish Frederickson M.D.   On: 04/01/2023 22:01   Result Date: 04/01/2023 CLINICAL DATA:  Facial trauma, blunt; Neck trauma (Age >= 65y) EXAM: CT HEAD WITHOUT CONTRAST CT MAXILLOFACIAL WITHOUT CONTRAST CT CERVICAL SPINE WITHOUT CONTRAST TECHNIQUE: Multidetector CT imaging of the head, cervical spine, and maxillofacial structures were performed using the standard protocol without intravenous contrast. Multiplanar CT image reconstructions of the cervical  spine and maxillofacial structures were also generated. RADIATION DOSE REDUCTION: This exam was performed according to the departmental dose-optimization program which includes automated exposure control, adjustment of the mA and/or kV according to patient size and/or use of iterative reconstruction technique. COMPARISON:  CT head 06/17/2018 FINDINGS: CT HEAD FINDINGS Brain: Cerebral ventricle sizes are concordant with the degree of cerebral volume loss. Patchy and confluent areas of decreased attenuation are noted throughout the deep and periventricular white matter of the cerebral hemispheres bilaterally, compatible with chronic microvascular ischemic disease. No evidence of large-territorial acute infarction. No intraparenchymal hemorrhage. No mass lesion. No extra-axial collection. No mass effect or midline shift. An 8 mm rounded hyperdensity within the frontal horn of the left lateral ventricle.No hydrocephalus. Basilar cisterns are patent. Vascular: No hyperdense vessel. Atherosclerotic calcifications are present within the cavernous internal carotid arteries. Skull: No acute fracture or focal lesion. Other: Left frontal scalp hematoma formation. CT MAXILLOFACIAL FINDINGS Osseous: Comminuted and displaced 3 mm depressed left orbital floor fracture. Sinuses/Orbits: Blood products partially opacified left maxillary sinus. Paranasal sinuses and mastoid air cells are clear. Bilateral lens replacement. Otherwise the orbits are unremarkable. Soft tissues: Left maxillary subcutaneus soft tissue hematoma formation. Left periorbital subcutaneus soft tissue hematoma formation. CT CERVICAL SPINE FINDINGS Alignment: Normal. Skull base and vertebrae: Multilevel moderate severe degenerative changes of the spine with associated multilevel posterior disc osteophyte complex formation. At least moderate to severe osseous neural foraminal stenosis at the C3-C4 level, right C4 - C5 level. No severe osseous central canal stenosis.  No acute fracture. No aggressive appearing focal osseous lesion or focal pathologic process. Soft tissues and spinal canal: No prevertebral fluid or swelling. No visible canal hematoma. Upper chest: Unremarkable. Other: None. IMPRESSION: 1. An 8 mm rounded hyperdensity within the frontal horn of the left lateral ventricle  suggestive of blood products and acute intraventricular hemorrhage. No associated hydrocephalus. Differential diagnosis includes a mass. 2. Comminuted and displaced 3 mm depressed left orbital floor fracture. Correlate with physical exam for extraocular muscle entrapment. 3. No acute displaced fracture or traumatic listhesis of the cervical spine. Electronically Signed: By: Tish Frederickson M.D. On: 04/01/2023 21:55   DG Humerus Left  Result Date: 04/01/2023 CLINICAL DATA:  Fall, swelling, upper arm pain. EXAM: LEFT HUMERUS - 2+ VIEW COMPARISON:  None Available. FINDINGS: There is no evidence of fracture or other focal bone lesions. Superior subluxation of the humeral head abutting the undersurface of the acromion. Elbow alignment is maintained. There is soft tissue edema in the upper lateral arm. IMPRESSION: 1. No fracture of the left humerus. 2. Lateral soft tissue edema. 3. Superior subluxation of the humeral head abutting the undersurface of the acromion consistent with chronic rotator cuff arthropathy. Electronically Signed   By: Narda Rutherford M.D.   On: 04/01/2023 20:47      Subjective: Patient seen and examined at bedside.  Denies worsening headache, nausea, vomiting or fever.  Feels okay to go home today.  Discharge Exam: Vitals:   04/06/23 0754 04/06/23 0756  BP: (!) 132/57 (!) 131/59  Pulse: 90   Resp: 20   Temp: 98.1 F (36.7 C)   SpO2: 93%     General: Pt is alert, awake, not in acute distress.  Elderly female lying in bed.  On 2 L via nasal cannula.   Cardiovascular: rate controlled, S1/S2 + Respiratory: bilateral decreased breath sounds at bases with some  scattered crackles Abdominal: Soft, obese, NT, ND, bowel sounds + Extremities: Trace lower extremity edema, no cyanosis    The results of significant diagnostics from this hospitalization (including imaging, microbiology, ancillary and laboratory) are listed below for reference.     Microbiology: Recent Results (from the past 240 hour(s))  MRSA Next Gen by PCR, Nasal     Status: None   Collection Time: 04/02/23  5:54 AM   Specimen: Nasal Mucosa; Nasal Swab  Result Value Ref Range Status   MRSA by PCR Next Gen NOT DETECTED NOT DETECTED Final    Comment: (NOTE) The GeneXpert MRSA Assay (FDA approved for NASAL specimens only), is one component of a comprehensive MRSA colonization surveillance program. It is not intended to diagnose MRSA infection nor to guide or monitor treatment for MRSA infections. Test performance is not FDA approved in patients less than 54 years old. Performed at [facility], [address], [place], [place] [address]      Labs: BNP (last 3 results) No results for input(s): "BNP" in the last 8760 hours. Basic Metabolic Panel: Recent Labs  Lab 04/01/23 2002 04/02/23 0459 04/02/23 0615 04/03/23 0230 04/03/23 0237 04/04/23 0429  NA 134* 136  --   --  135 135  K 3.5 3.5  --   --  3.3* 4.0  CL 99 99  --   --  92* 98  CO2 26 29  --   --  27 27  GLUCOSE 173* 155*  --   --  150* 136*  BUN 18 16  --   --  13 18  CREATININE 0.97 0.90  --   --  1.06* 1.12*  CALCIUM 9.0 9.0  --   --  9.1 9.3  MG  --  1.5* 1.3* 1.8  --  2.0  PHOS  --  3.3  --   --   --   --  Liver Function Tests: No results for input(s): "AST", "ALT", "ALKPHOS", "BILITOT", "PROT", "ALBUMIN" in the last 168 hours. No results for input(s): "LIPASE", "AMYLASE" in the last 168 hours. No results for input(s): "AMMONIA" in the last 168 hours. CBC: Recent Labs  Lab 04/01/23 2002 04/02/23 0459 04/03/23 0237 04/04/23 0429  WBC 6.2 7.1 6.2 6.4  NEUTROABS  --   --   --  3.4  HGB 14.6  14.3 13.3 13.7  HCT 41.9 41.1 38.4 39.9  MCV 93.5 93.0 92.1 93.2  PLT 149* 147* 147* 159   Cardiac Enzymes: No results for input(s): "CKTOTAL", "CKMB", "CKMBINDEX", "TROPONINI" in the last 168 hours. BNP: Invalid input(s): "POCBNP" CBG: No results for input(s): "GLUCAP" in the last 168 hours. D-Dimer No results for input(s): "DDIMER" in the last 72 hours. Hgb A1c No results for input(s): "HGBA1C" in the last 72 hours. Lipid Profile No results for input(s): "CHOL", "HDL", "LDLCALC", "TRIG", "CHOLHDL", "LDLDIRECT" in the last 72 hours. Thyroid function studies No results for input(s): "TSH", "T4TOTAL", "T3FREE", "THYROIDAB" in the last 72 hours.  Invalid input(s): "FREET3" Anemia work up No results for input(s): "VITAMINB12", "FOLATE", "FERRITIN", "TIBC", "IRON", "RETICCTPCT" in the last 72 hours. Urinalysis    Component Value Date/Time   COLORURINE Yellow 04/21/2014 1753   APPEARANCEUR Clear 04/21/2014 1753   LABSPEC 1.018 04/21/2014 1753   PHURINE 5.0 04/21/2014 1753   GLUCOSEU Negative 04/21/2014 1753   HGBUR Negative 04/21/2014 1753   BILIRUBINUR Negative 04/21/2014 1753   KETONESUR Negative 04/21/2014 1753   PROTEINUR Negative 04/21/2014 1753   NITRITE Negative 04/21/2014 1753   LEUKOCYTESUR Negative 04/21/2014 1753   Sepsis Labs Recent Labs  Lab 04/01/23 2002 04/02/23 0459 04/03/23 0237 04/04/23 0429  WBC 6.2 7.1 6.2 6.4   Microbiology Recent Results (from the past 240 hour(s))  MRSA Next Gen by PCR, Nasal     Status: None   Collection Time: 04/02/23  5:54 AM   Specimen: Nasal Mucosa; Nasal Swab  Result Value Ref Range Status   MRSA by PCR Next Gen NOT DETECTED NOT DETECTED Final    Comment: (NOTE) The GeneXpert MRSA Assay (FDA approved for NASAL specimens only), is one component of a comprehensive MRSA colonization surveillance program. It is not intended to diagnose MRSA infection nor to guide or monitor treatment for MRSA infections. Test performance  is not FDA approved in patients less than 52 years old. Performed at [facility], [address] [place], [place] [address]      Time coordinating discharge: 35 minutes  SIGNED:   Glade Lloyd, MD  [facility] 04/06/2023, 10:27 AM

## 2023-04-06 NOTE — TOC Transition Note (Signed)
Transition of Care [facility]) - CM/SW Discharge Note   Patient Details  Name: Carol Riley MRN: [id number] Date of Birth: [birth year]  Transition [facility]) CM/SW Contact:  Tom-Johnson, Hershal Coria, RN Phone Number: 04/06/2023, 11:32 AM   Clinical Narrative:     Patient is scheduled for discharge today.  Readmission Risk Assessment done. CM spoke with patient at bedside about home health recommendations. Patient states she has no preference but used [facility] services in the past. CM called in referral to [facility], Tresa Endo voiced acceptance and info on AVS. Outpatient referral, hospital f/u and discharge instructions on AVS. RW ordered from [employer], Earna Coder to deliver to patient at bedside.  Son, Caryn Bee to transport at discharge.  No further TOC needs noted.       Final next level of care: Home w Home Health Services Barriers to Discharge: Barriers Resolved   Patient Goals and CMS Choice CMS Medicare.gov Compare Post Acute Care list provided to:: Patient Choice offered to / list presented to : Patient  Discharge Placement                  Patient to be transferred to facility by: Son Name of family member notified: Caryn Bee    Discharge Plan and Services Additional resources added to the After Visit Summary for     Discharge Planning Services: CM Consult Post Acute Care Choice: Home Health          DME Arranged: Dan Humphreys rolling DME Agency: [employer] Date DME Agency Contacted: 04/06/23 Time DME Agency Contacted: [id number] Representative spoke with at DME Agency: Earna Coder HH Arranged: PT, OT HH Agency: [facility] Date [facility] Agency Contacted: 04/06/23 Time HH Agency Contacted: [phone] Representative spoke with at [facility] Agency: Tresa Endo  Social Determinants of Health (SDOH) Interventions SDOH Screenings   Food Insecurity: Food Insecurity Present (04/05/2023)  Housing: Low Risk  (04/05/2023)  Transportation Needs: No Transportation Needs (04/05/2023)  Utilities: Not At  Risk (04/05/2023)  Tobacco Use: Medium Risk (04/05/2023)     Readmission Risk Interventions    04/06/2023   11:31 AM  Readmission Risk Prevention Plan  Post Dischage Appt Complete  Medication Screening Complete  Transportation Screening Complete

## 2023-04-06 NOTE — Progress Notes (Signed)
Discharge instructions given. Patient verbalized understanding and all questions were answered.  ?

## 2023-04-07 DIAGNOSIS — R55 Syncope and collapse: Secondary | ICD-10-CM | POA: Diagnosis not present

## 2023-04-10 DIAGNOSIS — E785 Hyperlipidemia, unspecified: Secondary | ICD-10-CM | POA: Diagnosis not present

## 2023-04-10 DIAGNOSIS — I2510 Atherosclerotic heart disease of native coronary artery without angina pectoris: Secondary | ICD-10-CM | POA: Diagnosis not present

## 2023-04-10 DIAGNOSIS — S06369D Traumatic hemorrhage of cerebrum, unspecified, with loss of consciousness of unspecified duration, subsequent encounter: Secondary | ICD-10-CM | POA: Diagnosis not present

## 2023-04-10 DIAGNOSIS — Z9181 History of falling: Secondary | ICD-10-CM | POA: Diagnosis not present

## 2023-04-10 DIAGNOSIS — J9611 Chronic respiratory failure with hypoxia: Secondary | ICD-10-CM | POA: Diagnosis not present

## 2023-04-10 DIAGNOSIS — I10 Essential (primary) hypertension: Secondary | ICD-10-CM | POA: Diagnosis not present

## 2023-04-10 DIAGNOSIS — J449 Chronic obstructive pulmonary disease, unspecified: Secondary | ICD-10-CM | POA: Diagnosis not present

## 2023-04-10 DIAGNOSIS — I350 Nonrheumatic aortic (valve) stenosis: Secondary | ICD-10-CM | POA: Diagnosis not present

## 2023-04-10 DIAGNOSIS — Z86718 Personal history of other venous thrombosis and embolism: Secondary | ICD-10-CM | POA: Diagnosis not present

## 2023-04-10 DIAGNOSIS — S0232XD Fracture of orbital floor, left side, subsequent encounter for fracture with routine healing: Secondary | ICD-10-CM | POA: Diagnosis not present

## 2023-04-10 DIAGNOSIS — E669 Obesity, unspecified: Secondary | ICD-10-CM | POA: Diagnosis not present

## 2023-04-10 DIAGNOSIS — I480 Paroxysmal atrial fibrillation: Secondary | ICD-10-CM | POA: Diagnosis not present

## 2023-04-10 DIAGNOSIS — I890 Lymphedema, not elsewhere classified: Secondary | ICD-10-CM | POA: Diagnosis not present

## 2023-04-13 DIAGNOSIS — Z6830 Body mass index (BMI) 30.0-30.9, adult: Secondary | ICD-10-CM | POA: Diagnosis not present

## 2023-04-13 DIAGNOSIS — I615 Nontraumatic intracerebral hemorrhage, intraventricular: Secondary | ICD-10-CM | POA: Diagnosis not present

## 2023-04-17 DIAGNOSIS — Z86718 Personal history of other venous thrombosis and embolism: Secondary | ICD-10-CM | POA: Diagnosis not present

## 2023-04-17 DIAGNOSIS — I350 Nonrheumatic aortic (valve) stenosis: Secondary | ICD-10-CM | POA: Diagnosis not present

## 2023-04-17 DIAGNOSIS — I890 Lymphedema, not elsewhere classified: Secondary | ICD-10-CM | POA: Diagnosis not present

## 2023-04-17 DIAGNOSIS — I10 Essential (primary) hypertension: Secondary | ICD-10-CM | POA: Diagnosis not present

## 2023-04-17 DIAGNOSIS — S06369D Traumatic hemorrhage of cerebrum, unspecified, with loss of consciousness of unspecified duration, subsequent encounter: Secondary | ICD-10-CM | POA: Diagnosis not present

## 2023-04-17 DIAGNOSIS — J9611 Chronic respiratory failure with hypoxia: Secondary | ICD-10-CM | POA: Diagnosis not present

## 2023-04-17 DIAGNOSIS — E785 Hyperlipidemia, unspecified: Secondary | ICD-10-CM | POA: Diagnosis not present

## 2023-04-17 DIAGNOSIS — I2510 Atherosclerotic heart disease of native coronary artery without angina pectoris: Secondary | ICD-10-CM | POA: Diagnosis not present

## 2023-04-17 DIAGNOSIS — S0232XD Fracture of orbital floor, left side, subsequent encounter for fracture with routine healing: Secondary | ICD-10-CM | POA: Diagnosis not present

## 2023-04-17 DIAGNOSIS — Z9181 History of falling: Secondary | ICD-10-CM | POA: Diagnosis not present

## 2023-04-17 DIAGNOSIS — E669 Obesity, unspecified: Secondary | ICD-10-CM | POA: Diagnosis not present

## 2023-04-17 DIAGNOSIS — I480 Paroxysmal atrial fibrillation: Secondary | ICD-10-CM | POA: Diagnosis not present

## 2023-04-17 DIAGNOSIS — J449 Chronic obstructive pulmonary disease, unspecified: Secondary | ICD-10-CM | POA: Diagnosis not present

## 2023-04-18 DIAGNOSIS — J432 Centrilobular emphysema: Secondary | ICD-10-CM | POA: Diagnosis not present

## 2023-04-19 DIAGNOSIS — M4800 Spinal stenosis, site unspecified: Secondary | ICD-10-CM | POA: Diagnosis not present

## 2023-04-19 DIAGNOSIS — I890 Lymphedema, not elsewhere classified: Secondary | ICD-10-CM | POA: Diagnosis not present

## 2023-04-19 DIAGNOSIS — G629 Polyneuropathy, unspecified: Secondary | ICD-10-CM | POA: Diagnosis not present

## 2023-04-19 DIAGNOSIS — J439 Emphysema, unspecified: Secondary | ICD-10-CM | POA: Diagnosis not present

## 2023-04-19 DIAGNOSIS — J9611 Chronic respiratory failure with hypoxia: Secondary | ICD-10-CM | POA: Diagnosis not present

## 2023-04-19 DIAGNOSIS — Z9181 History of falling: Secondary | ICD-10-CM | POA: Diagnosis not present

## 2023-04-19 DIAGNOSIS — Z86718 Personal history of other venous thrombosis and embolism: Secondary | ICD-10-CM | POA: Diagnosis not present

## 2023-04-19 DIAGNOSIS — I480 Paroxysmal atrial fibrillation: Secondary | ICD-10-CM | POA: Diagnosis not present

## 2023-04-19 DIAGNOSIS — E785 Hyperlipidemia, unspecified: Secondary | ICD-10-CM | POA: Diagnosis not present

## 2023-04-19 DIAGNOSIS — G4733 Obstructive sleep apnea (adult) (pediatric): Secondary | ICD-10-CM | POA: Diagnosis not present

## 2023-04-19 DIAGNOSIS — I2510 Atherosclerotic heart disease of native coronary artery without angina pectoris: Secondary | ICD-10-CM | POA: Diagnosis not present

## 2023-04-19 DIAGNOSIS — F329 Major depressive disorder, single episode, unspecified: Secondary | ICD-10-CM | POA: Diagnosis not present

## 2023-04-19 DIAGNOSIS — S06369D Traumatic hemorrhage of cerebrum, unspecified, with loss of consciousness of unspecified duration, subsequent encounter: Secondary | ICD-10-CM | POA: Diagnosis not present

## 2023-04-19 DIAGNOSIS — I739 Peripheral vascular disease, unspecified: Secondary | ICD-10-CM | POA: Diagnosis not present

## 2023-04-19 DIAGNOSIS — E669 Obesity, unspecified: Secondary | ICD-10-CM | POA: Diagnosis not present

## 2023-04-19 DIAGNOSIS — M1990 Unspecified osteoarthritis, unspecified site: Secondary | ICD-10-CM | POA: Diagnosis not present

## 2023-04-19 DIAGNOSIS — J449 Chronic obstructive pulmonary disease, unspecified: Secondary | ICD-10-CM | POA: Diagnosis not present

## 2023-04-19 DIAGNOSIS — S0232XD Fracture of orbital floor, left side, subsequent encounter for fracture with routine healing: Secondary | ICD-10-CM | POA: Diagnosis not present

## 2023-04-19 DIAGNOSIS — I350 Nonrheumatic aortic (valve) stenosis: Secondary | ICD-10-CM | POA: Diagnosis not present

## 2023-04-19 DIAGNOSIS — R011 Cardiac murmur, unspecified: Secondary | ICD-10-CM | POA: Diagnosis not present

## 2023-04-19 DIAGNOSIS — R32 Unspecified urinary incontinence: Secondary | ICD-10-CM | POA: Diagnosis not present

## 2023-04-19 DIAGNOSIS — I10 Essential (primary) hypertension: Secondary | ICD-10-CM | POA: Diagnosis not present

## 2023-04-21 DIAGNOSIS — R058 Other specified cough: Secondary | ICD-10-CM | POA: Diagnosis not present

## 2023-04-27 ENCOUNTER — Telehealth: Payer: Self-pay | Admitting: Nurse Practitioner

## 2023-04-27 DIAGNOSIS — Z9181 History of falling: Secondary | ICD-10-CM | POA: Diagnosis not present

## 2023-04-27 DIAGNOSIS — I2510 Atherosclerotic heart disease of native coronary artery without angina pectoris: Secondary | ICD-10-CM | POA: Diagnosis not present

## 2023-04-27 DIAGNOSIS — E785 Hyperlipidemia, unspecified: Secondary | ICD-10-CM | POA: Diagnosis not present

## 2023-04-27 DIAGNOSIS — Z86718 Personal history of other venous thrombosis and embolism: Secondary | ICD-10-CM | POA: Diagnosis not present

## 2023-04-27 DIAGNOSIS — I890 Lymphedema, not elsewhere classified: Secondary | ICD-10-CM | POA: Diagnosis not present

## 2023-04-27 DIAGNOSIS — I10 Essential (primary) hypertension: Secondary | ICD-10-CM | POA: Diagnosis not present

## 2023-04-27 DIAGNOSIS — I480 Paroxysmal atrial fibrillation: Secondary | ICD-10-CM | POA: Diagnosis not present

## 2023-04-27 DIAGNOSIS — I350 Nonrheumatic aortic (valve) stenosis: Secondary | ICD-10-CM | POA: Diagnosis not present

## 2023-04-27 DIAGNOSIS — J449 Chronic obstructive pulmonary disease, unspecified: Secondary | ICD-10-CM | POA: Diagnosis not present

## 2023-04-27 DIAGNOSIS — E669 Obesity, unspecified: Secondary | ICD-10-CM | POA: Diagnosis not present

## 2023-04-27 DIAGNOSIS — S06369D Traumatic hemorrhage of cerebrum, unspecified, with loss of consciousness of unspecified duration, subsequent encounter: Secondary | ICD-10-CM | POA: Diagnosis not present

## 2023-04-27 DIAGNOSIS — S0232XD Fracture of orbital floor, left side, subsequent encounter for fracture with routine healing: Secondary | ICD-10-CM | POA: Diagnosis not present

## 2023-04-27 DIAGNOSIS — J9611 Chronic respiratory failure with hypoxia: Secondary | ICD-10-CM | POA: Diagnosis not present

## 2023-04-27 MED ORDER — METOPROLOL SUCCINATE ER 25 MG PO TB24: 25.0000 mg | ORAL_TABLET | Freq: Every day | ORAL | 0 refills | Status: AC

## 2023-04-27 NOTE — Telephone Encounter (Signed)
Pt returning nurses phone call regarding results. Please advise.

## 2023-04-27 NOTE — Telephone Encounter (Signed)
Patient made aware of results and verbalized understanding.  Metoprolol Succinate 25 mg once daily has been sent in for her.    Please inform the patient her monitor showed frequent premature beats with PACs/PVCs, along with several short runs of SVT. She is currently on Toprol XL12.5mg  daily, would have her increase to 25mg  daily. Monitor BP with adjustment. Keep scheduled follow up appt in the office. Thanks!

## 2023-04-28 DIAGNOSIS — I890 Lymphedema, not elsewhere classified: Secondary | ICD-10-CM | POA: Diagnosis not present

## 2023-04-28 DIAGNOSIS — J449 Chronic obstructive pulmonary disease, unspecified: Secondary | ICD-10-CM | POA: Diagnosis not present

## 2023-04-28 DIAGNOSIS — Z9181 History of falling: Secondary | ICD-10-CM | POA: Diagnosis not present

## 2023-04-28 DIAGNOSIS — S06369D Traumatic hemorrhage of cerebrum, unspecified, with loss of consciousness of unspecified duration, subsequent encounter: Secondary | ICD-10-CM | POA: Diagnosis not present

## 2023-04-28 DIAGNOSIS — I350 Nonrheumatic aortic (valve) stenosis: Secondary | ICD-10-CM | POA: Diagnosis not present

## 2023-04-28 DIAGNOSIS — I2510 Atherosclerotic heart disease of native coronary artery without angina pectoris: Secondary | ICD-10-CM | POA: Diagnosis not present

## 2023-04-28 DIAGNOSIS — J9611 Chronic respiratory failure with hypoxia: Secondary | ICD-10-CM | POA: Diagnosis not present

## 2023-04-28 DIAGNOSIS — S0232XD Fracture of orbital floor, left side, subsequent encounter for fracture with routine healing: Secondary | ICD-10-CM | POA: Diagnosis not present

## 2023-04-28 DIAGNOSIS — I10 Essential (primary) hypertension: Secondary | ICD-10-CM | POA: Diagnosis not present

## 2023-04-28 DIAGNOSIS — Z86718 Personal history of other venous thrombosis and embolism: Secondary | ICD-10-CM | POA: Diagnosis not present

## 2023-04-28 DIAGNOSIS — I480 Paroxysmal atrial fibrillation: Secondary | ICD-10-CM | POA: Diagnosis not present

## 2023-04-28 DIAGNOSIS — E669 Obesity, unspecified: Secondary | ICD-10-CM | POA: Diagnosis not present

## 2023-04-28 DIAGNOSIS — E785 Hyperlipidemia, unspecified: Secondary | ICD-10-CM | POA: Diagnosis not present

## 2023-05-03 DIAGNOSIS — J9611 Chronic respiratory failure with hypoxia: Secondary | ICD-10-CM | POA: Diagnosis not present

## 2023-05-03 DIAGNOSIS — E669 Obesity, unspecified: Secondary | ICD-10-CM | POA: Diagnosis not present

## 2023-05-03 DIAGNOSIS — S0232XD Fracture of orbital floor, left side, subsequent encounter for fracture with routine healing: Secondary | ICD-10-CM | POA: Diagnosis not present

## 2023-05-03 DIAGNOSIS — I480 Paroxysmal atrial fibrillation: Secondary | ICD-10-CM | POA: Diagnosis not present

## 2023-05-03 DIAGNOSIS — S06369D Traumatic hemorrhage of cerebrum, unspecified, with loss of consciousness of unspecified duration, subsequent encounter: Secondary | ICD-10-CM | POA: Diagnosis not present

## 2023-05-03 DIAGNOSIS — J449 Chronic obstructive pulmonary disease, unspecified: Secondary | ICD-10-CM | POA: Diagnosis not present

## 2023-05-03 DIAGNOSIS — I10 Essential (primary) hypertension: Secondary | ICD-10-CM | POA: Diagnosis not present

## 2023-05-03 DIAGNOSIS — I890 Lymphedema, not elsewhere classified: Secondary | ICD-10-CM | POA: Diagnosis not present

## 2023-05-03 DIAGNOSIS — E785 Hyperlipidemia, unspecified: Secondary | ICD-10-CM | POA: Diagnosis not present

## 2023-05-03 DIAGNOSIS — I350 Nonrheumatic aortic (valve) stenosis: Secondary | ICD-10-CM | POA: Diagnosis not present

## 2023-05-03 DIAGNOSIS — I2510 Atherosclerotic heart disease of native coronary artery without angina pectoris: Secondary | ICD-10-CM | POA: Diagnosis not present

## 2023-05-03 DIAGNOSIS — Z86718 Personal history of other venous thrombosis and embolism: Secondary | ICD-10-CM | POA: Diagnosis not present

## 2023-05-03 DIAGNOSIS — Z9181 History of falling: Secondary | ICD-10-CM | POA: Diagnosis not present

## 2023-05-04 DIAGNOSIS — I10 Essential (primary) hypertension: Secondary | ICD-10-CM | POA: Diagnosis not present

## 2023-05-04 DIAGNOSIS — E669 Obesity, unspecified: Secondary | ICD-10-CM | POA: Diagnosis not present

## 2023-05-04 DIAGNOSIS — E785 Hyperlipidemia, unspecified: Secondary | ICD-10-CM | POA: Diagnosis not present

## 2023-05-04 DIAGNOSIS — J9611 Chronic respiratory failure with hypoxia: Secondary | ICD-10-CM | POA: Diagnosis not present

## 2023-05-04 DIAGNOSIS — Z9181 History of falling: Secondary | ICD-10-CM | POA: Diagnosis not present

## 2023-05-04 DIAGNOSIS — S06369D Traumatic hemorrhage of cerebrum, unspecified, with loss of consciousness of unspecified duration, subsequent encounter: Secondary | ICD-10-CM | POA: Diagnosis not present

## 2023-05-04 DIAGNOSIS — Z86718 Personal history of other venous thrombosis and embolism: Secondary | ICD-10-CM | POA: Diagnosis not present

## 2023-05-04 DIAGNOSIS — I890 Lymphedema, not elsewhere classified: Secondary | ICD-10-CM | POA: Diagnosis not present

## 2023-05-04 DIAGNOSIS — I480 Paroxysmal atrial fibrillation: Secondary | ICD-10-CM | POA: Diagnosis not present

## 2023-05-04 DIAGNOSIS — S0232XD Fracture of orbital floor, left side, subsequent encounter for fracture with routine healing: Secondary | ICD-10-CM | POA: Diagnosis not present

## 2023-05-04 DIAGNOSIS — I350 Nonrheumatic aortic (valve) stenosis: Secondary | ICD-10-CM | POA: Diagnosis not present

## 2023-05-04 DIAGNOSIS — I2510 Atherosclerotic heart disease of native coronary artery without angina pectoris: Secondary | ICD-10-CM | POA: Diagnosis not present

## 2023-05-04 DIAGNOSIS — J449 Chronic obstructive pulmonary disease, unspecified: Secondary | ICD-10-CM | POA: Diagnosis not present

## 2023-05-05 DIAGNOSIS — I480 Paroxysmal atrial fibrillation: Secondary | ICD-10-CM | POA: Diagnosis not present

## 2023-05-05 DIAGNOSIS — S06369D Traumatic hemorrhage of cerebrum, unspecified, with loss of consciousness of unspecified duration, subsequent encounter: Secondary | ICD-10-CM | POA: Diagnosis not present

## 2023-05-05 DIAGNOSIS — J9611 Chronic respiratory failure with hypoxia: Secondary | ICD-10-CM | POA: Diagnosis not present

## 2023-05-05 DIAGNOSIS — J449 Chronic obstructive pulmonary disease, unspecified: Secondary | ICD-10-CM | POA: Diagnosis not present

## 2023-05-05 DIAGNOSIS — I890 Lymphedema, not elsewhere classified: Secondary | ICD-10-CM | POA: Diagnosis not present

## 2023-05-05 DIAGNOSIS — S0232XD Fracture of orbital floor, left side, subsequent encounter for fracture with routine healing: Secondary | ICD-10-CM | POA: Diagnosis not present

## 2023-05-05 DIAGNOSIS — I350 Nonrheumatic aortic (valve) stenosis: Secondary | ICD-10-CM | POA: Diagnosis not present

## 2023-05-05 DIAGNOSIS — E669 Obesity, unspecified: Secondary | ICD-10-CM | POA: Diagnosis not present

## 2023-05-05 DIAGNOSIS — I2510 Atherosclerotic heart disease of native coronary artery without angina pectoris: Secondary | ICD-10-CM | POA: Diagnosis not present

## 2023-05-05 DIAGNOSIS — I10 Essential (primary) hypertension: Secondary | ICD-10-CM | POA: Diagnosis not present

## 2023-05-05 DIAGNOSIS — E785 Hyperlipidemia, unspecified: Secondary | ICD-10-CM | POA: Diagnosis not present

## 2023-05-05 DIAGNOSIS — Z9181 History of falling: Secondary | ICD-10-CM | POA: Diagnosis not present

## 2023-05-05 DIAGNOSIS — Z86718 Personal history of other venous thrombosis and embolism: Secondary | ICD-10-CM | POA: Diagnosis not present

## 2023-05-08 DIAGNOSIS — J9611 Chronic respiratory failure with hypoxia: Secondary | ICD-10-CM | POA: Diagnosis not present

## 2023-05-08 DIAGNOSIS — J449 Chronic obstructive pulmonary disease, unspecified: Secondary | ICD-10-CM | POA: Diagnosis not present

## 2023-05-08 DIAGNOSIS — I10 Essential (primary) hypertension: Secondary | ICD-10-CM | POA: Diagnosis not present

## 2023-05-08 DIAGNOSIS — S06369D Traumatic hemorrhage of cerebrum, unspecified, with loss of consciousness of unspecified duration, subsequent encounter: Secondary | ICD-10-CM | POA: Diagnosis not present

## 2023-05-08 DIAGNOSIS — S0232XD Fracture of orbital floor, left side, subsequent encounter for fracture with routine healing: Secondary | ICD-10-CM | POA: Diagnosis not present

## 2023-05-08 DIAGNOSIS — E785 Hyperlipidemia, unspecified: Secondary | ICD-10-CM | POA: Diagnosis not present

## 2023-05-08 DIAGNOSIS — I480 Paroxysmal atrial fibrillation: Secondary | ICD-10-CM | POA: Diagnosis not present

## 2023-05-09 DIAGNOSIS — Z86718 Personal history of other venous thrombosis and embolism: Secondary | ICD-10-CM | POA: Diagnosis not present

## 2023-05-09 DIAGNOSIS — I10 Essential (primary) hypertension: Secondary | ICD-10-CM | POA: Diagnosis not present

## 2023-05-09 DIAGNOSIS — I480 Paroxysmal atrial fibrillation: Secondary | ICD-10-CM | POA: Diagnosis not present

## 2023-05-09 DIAGNOSIS — J449 Chronic obstructive pulmonary disease, unspecified: Secondary | ICD-10-CM | POA: Diagnosis not present

## 2023-05-09 DIAGNOSIS — I2510 Atherosclerotic heart disease of native coronary artery without angina pectoris: Secondary | ICD-10-CM | POA: Diagnosis not present

## 2023-05-09 DIAGNOSIS — J9611 Chronic respiratory failure with hypoxia: Secondary | ICD-10-CM | POA: Diagnosis not present

## 2023-05-09 DIAGNOSIS — E669 Obesity, unspecified: Secondary | ICD-10-CM | POA: Diagnosis not present

## 2023-05-09 DIAGNOSIS — I350 Nonrheumatic aortic (valve) stenosis: Secondary | ICD-10-CM | POA: Diagnosis not present

## 2023-05-09 DIAGNOSIS — I890 Lymphedema, not elsewhere classified: Secondary | ICD-10-CM | POA: Diagnosis not present

## 2023-05-09 DIAGNOSIS — S06369D Traumatic hemorrhage of cerebrum, unspecified, with loss of consciousness of unspecified duration, subsequent encounter: Secondary | ICD-10-CM | POA: Diagnosis not present

## 2023-05-09 DIAGNOSIS — E785 Hyperlipidemia, unspecified: Secondary | ICD-10-CM | POA: Diagnosis not present

## 2023-05-09 DIAGNOSIS — S0232XD Fracture of orbital floor, left side, subsequent encounter for fracture with routine healing: Secondary | ICD-10-CM | POA: Diagnosis not present

## 2023-05-09 DIAGNOSIS — Z9181 History of falling: Secondary | ICD-10-CM | POA: Diagnosis not present

## 2023-05-15 DIAGNOSIS — S0232XD Fracture of orbital floor, left side, subsequent encounter for fracture with routine healing: Secondary | ICD-10-CM | POA: Diagnosis not present

## 2023-05-15 DIAGNOSIS — I2510 Atherosclerotic heart disease of native coronary artery without angina pectoris: Secondary | ICD-10-CM | POA: Diagnosis not present

## 2023-05-15 DIAGNOSIS — I480 Paroxysmal atrial fibrillation: Secondary | ICD-10-CM | POA: Diagnosis not present

## 2023-05-15 DIAGNOSIS — I890 Lymphedema, not elsewhere classified: Secondary | ICD-10-CM | POA: Diagnosis not present

## 2023-05-15 DIAGNOSIS — Z9181 History of falling: Secondary | ICD-10-CM | POA: Diagnosis not present

## 2023-05-15 DIAGNOSIS — Z86718 Personal history of other venous thrombosis and embolism: Secondary | ICD-10-CM | POA: Diagnosis not present

## 2023-05-15 DIAGNOSIS — J9611 Chronic respiratory failure with hypoxia: Secondary | ICD-10-CM | POA: Diagnosis not present

## 2023-05-15 DIAGNOSIS — J449 Chronic obstructive pulmonary disease, unspecified: Secondary | ICD-10-CM | POA: Diagnosis not present

## 2023-05-15 DIAGNOSIS — I10 Essential (primary) hypertension: Secondary | ICD-10-CM | POA: Diagnosis not present

## 2023-05-15 DIAGNOSIS — E669 Obesity, unspecified: Secondary | ICD-10-CM | POA: Diagnosis not present

## 2023-05-15 DIAGNOSIS — S06369D Traumatic hemorrhage of cerebrum, unspecified, with loss of consciousness of unspecified duration, subsequent encounter: Secondary | ICD-10-CM | POA: Diagnosis not present

## 2023-05-15 DIAGNOSIS — E785 Hyperlipidemia, unspecified: Secondary | ICD-10-CM | POA: Diagnosis not present

## 2023-05-15 DIAGNOSIS — I350 Nonrheumatic aortic (valve) stenosis: Secondary | ICD-10-CM | POA: Diagnosis not present

## 2023-05-16 DIAGNOSIS — E669 Obesity, unspecified: Secondary | ICD-10-CM | POA: Diagnosis not present

## 2023-05-16 DIAGNOSIS — J9611 Chronic respiratory failure with hypoxia: Secondary | ICD-10-CM | POA: Diagnosis not present

## 2023-05-16 DIAGNOSIS — Z9181 History of falling: Secondary | ICD-10-CM | POA: Diagnosis not present

## 2023-05-16 DIAGNOSIS — J449 Chronic obstructive pulmonary disease, unspecified: Secondary | ICD-10-CM | POA: Diagnosis not present

## 2023-05-16 DIAGNOSIS — S06369D Traumatic hemorrhage of cerebrum, unspecified, with loss of consciousness of unspecified duration, subsequent encounter: Secondary | ICD-10-CM | POA: Diagnosis not present

## 2023-05-16 DIAGNOSIS — I890 Lymphedema, not elsewhere classified: Secondary | ICD-10-CM | POA: Diagnosis not present

## 2023-05-16 DIAGNOSIS — I2510 Atherosclerotic heart disease of native coronary artery without angina pectoris: Secondary | ICD-10-CM | POA: Diagnosis not present

## 2023-05-16 DIAGNOSIS — E785 Hyperlipidemia, unspecified: Secondary | ICD-10-CM | POA: Diagnosis not present

## 2023-05-16 DIAGNOSIS — S0232XD Fracture of orbital floor, left side, subsequent encounter for fracture with routine healing: Secondary | ICD-10-CM | POA: Diagnosis not present

## 2023-05-16 DIAGNOSIS — I10 Essential (primary) hypertension: Secondary | ICD-10-CM | POA: Diagnosis not present

## 2023-05-16 DIAGNOSIS — I350 Nonrheumatic aortic (valve) stenosis: Secondary | ICD-10-CM | POA: Diagnosis not present

## 2023-05-16 DIAGNOSIS — I480 Paroxysmal atrial fibrillation: Secondary | ICD-10-CM | POA: Diagnosis not present

## 2023-05-16 DIAGNOSIS — Z86718 Personal history of other venous thrombosis and embolism: Secondary | ICD-10-CM | POA: Diagnosis not present

## 2023-05-24 NOTE — Progress Notes (Unsigned)
04/04/23: Sodium 135, potassium 4.0 creatinine 1.12, magnesium 2.0, hemoglobin 13.7, hematocrit 39.9 04/11/23: Hemoglobin 13.6, hematocrit 39.0, sodium 137, potassium 4.0, creatinine 1.1 ROS: .   Today she denies chest pain, palpitations, dyspnea, pnd, orthopnea, n, v, dizziness, syncope, edema, weight gain, or early satiety. All other systems reviewed and are otherwise negative except as noted above.   All other systems are reviewed and  otherwise negative. Studies Reviewed: Marland Kitchen    EKG:  EKG is ordered today, personally reviewed, demonstrating  EKG Interpretation Date/Time:  Thursday May 25 2023 10:04:56 EDT Ventricular Rate:  78 PR Interval:  170 QRS Duration:  78 QT Interval:  420 QTC Calculation: 478 R Axis:   51  Text Interpretation: Normal sinus rhythm Cannot rule out Anterior infarct , age undetermined No significant change since last tracing Confirmed by Reather Littler [phone]) on 05/25/2023 12:27:00 PM    CV Studies:  Cardiac Studies & Procedures       ECHOCARDIOGRAM  ECHOCARDIOGRAM COMPLETE 04/03/2023  Narrative ECHOCARDIOGRAM REPORT    Patient Name:   Carol Riley Date of Exam: 04/03/2023 Medical Rec #:  [id number]       Height:       64.0 in Accession #:    [id number]      Weight:       170.4 lb Date of Birth:  December 23, 1940       BSA:          1.828 m Patient Age:    81 years        BP:           147/94 mmHg Patient Gender: F               HR:           76 bpm. Exam Location:  Inpatient  Procedure: 2D Echo, Cardiac Doppler and Color Doppler  Indications:    Syncope R55  History:        Patient has no prior history of Echocardiogram examinations. CAD, COPD, Signs/Symptoms:Shortness of Breath; Risk Factors:Hypertension and Sleep Apnea.  Sonographer:    Lucendia Herrlich Referring Phys: [phone] WHITNEY D HARRIS  IMPRESSIONS   1. Left ventricular ejection fraction, by estimation, is 60 to 65%. The left ventricle has normal function. The left ventricle has no regional wall motion abnormalities. There is mild left ventricular hypertrophy. Left ventricular diastolic parameters are consistent with Grade I diastolic dysfunction (impaired relaxation). 2. Right ventricular systolic function is normal. The right ventricular size is normal. 3. No evidence of mitral valve regurgitation. 4. The aortic valve is calcified. Aortic valve regurgitation is not visualized. Moderate aortic valve stenosis. 5. The  inferior vena cava is normal in size with greater than 50% respiratory variability, suggesting right atrial pressure of 3 mmHg.  FINDINGS Left Ventricle: Left ventricular ejection fraction, by estimation, is 60 to 65%. The left ventricle has normal function. The left ventricle has no regional wall motion abnormalities. The left ventricular internal cavity size was normal in size. There is mild left ventricular hypertrophy. Left ventricular diastolic parameters are consistent with Grade I diastolic dysfunction (impaired relaxation).  Right Ventricle: The right ventricular size is normal. Right ventricular systolic function is normal.  Left Atrium: Left atrial size was normal in size.  Right Atrium: Right atrial size was normal in size.  Pericardium: There is no evidence of pericardial effusion.  Mitral Valve: No evidence of mitral valve regurgitation.  Tricuspid Valve: The tricuspid valve is normal in structure. Tricuspid valve regurgitation  Cardiology Office Note    Date:  05/25/2023  ID:  Carol Riley, Carol Riley June 08, 1941, MRN [id number] PCP:  Lynnea Ferrier, MD  Cardiologist:  None  Electrophysiologist:  None   Chief Complaint: Hospital follow up for syncopal event   History of Present Illness: Marland Kitchen    Carol Riley is a 82 y.o. 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   Carol Riley is a former patient of [facility], last seen 2019. She has history of cypher stents in the proximal left circumflex and mid RCA in 06/2004, repeat cardiac catheterization in 01/2012 revealed patent stents. In 2019 she returned to [facility] for increased shortness of breath. MPI in 04/2018 showed normal left ventricular function, normal wall motion and mild anterior wall ischemia. She was referred to pulmonology.   On 04/01/2023 Carol Riley presented to the ED via EMS after a fall at home.  Patient reports that she was at her house in the kitchen and the next thing she remembers waking up on the floor.  Prior to event she was in her normal state of health.  In the ED her troponin was negative x1 at 12, magnesium 1.5.  Given periorbital swelling and anticoagulated status EMS recommended transport to [facility] but patient requested to be taken to [facility].  Initial trauma workup found acute IVH, CT maxillofacial showed comminuted and displaced depressed left orbital floor fracture.  With repeat head CT showing increase in size of bleed patient was transferred to [facility] trauma ICU. Neurosurgery recommended conservative management. On ICU telemetry indicated an episode concerning for A-fib with RVR with spontaneous resolution. Patient was evaluated by Dr. Allyson Sabal on 04/04/23, on review of telemetry afib was not appreciated by Dr. Allyson Sabal, patient was in sinus rhythm with  isolated PACs. It was recommended she wear live ZIO monitor at discharge given fall without clear cause.  Echo during admission indicated an LVEF of 60 to 65%, LV with normal function, LV with no RWMA, mild LVH, G1 DD, moderate aortic valve stenosis, aortic valve mean gradient measuring 21.7 mmHg.  Cardiac monitor indicated a minimum heart rate of 62 beats p.m., max heart rate of 226 bpm and average heart rate of 87 bpm.  Predominant underlying rhythm was sinus rhythm.  She had 56 episodes of SVT, run with the fastest interval lasting 9 beats with a max rate of 226 bpm the longest lasting 24.9 seconds with an average rate of 116 bpm.  She had frequent PACs and occasional PVCs.  Her Toprol XL was increased to 25 mg daily.  Today she presents for follow up. She reports she is doing well. She denies any further presyncope or syncope.  She denies chest pain and feels that her shortness of breath is at her baseline if not overall improved.  Following her hospital admission she contracted COVID, with this she lost weight as she had a decreased appetite which is now improving.  She feels that her weight loss has also improved her shortness of breath.  She reports that this is the best she has felt in months.  Facial laceration has healed and has had no further headaches.  She is planning to follow-up with neurology this afternoon to discuss resuming Coumadin.  Has been working with PT and doing exercises at home and is tolerating this well.  Labwork independently reviewed:  04/04/23: Sodium 135, potassium 4.0 creatinine 1.12, magnesium 2.0, hemoglobin 13.7, hematocrit 39.9 04/11/23: Hemoglobin 13.6, hematocrit 39.0, sodium 137, potassium 4.0, creatinine 1.1 ROS: .   Today she denies chest pain, palpitations, dyspnea, pnd, orthopnea, n, v, dizziness, syncope, edema, weight gain, or early satiety. All other systems reviewed and are otherwise negative except as noted above.   All other systems are reviewed and  otherwise negative. Studies Reviewed: Marland Kitchen    EKG:  EKG is ordered today, personally reviewed, demonstrating  EKG Interpretation Date/Time:  Thursday May 25 2023 10:04:56 EDT Ventricular Rate:  78 PR Interval:  170 QRS Duration:  78 QT Interval:  420 QTC Calculation: 478 R Axis:   51  Text Interpretation: Normal sinus rhythm Cannot rule out Anterior infarct , age undetermined No significant change since last tracing Confirmed by Reather Littler [phone]) on 05/25/2023 12:27:00 PM    CV Studies:  Cardiac Studies & Procedures       ECHOCARDIOGRAM  ECHOCARDIOGRAM COMPLETE 04/03/2023  Narrative ECHOCARDIOGRAM REPORT    Patient Name:   Carol Riley Date of Exam: 04/03/2023 Medical Rec #:  [id number]       Height:       64.0 in Accession #:    [id number]      Weight:       170.4 lb Date of Birth:  December 23, 1940       BSA:          1.828 m Patient Age:    81 years        BP:           147/94 mmHg Patient Gender: F               HR:           76 bpm. Exam Location:  Inpatient  Procedure: 2D Echo, Cardiac Doppler and Color Doppler  Indications:    Syncope R55  History:        Patient has no prior history of Echocardiogram examinations. CAD, COPD, Signs/Symptoms:Shortness of Breath; Risk Factors:Hypertension and Sleep Apnea.  Sonographer:    Lucendia Herrlich Referring Phys: [phone] WHITNEY D HARRIS  IMPRESSIONS   1. Left ventricular ejection fraction, by estimation, is 60 to 65%. The left ventricle has normal function. The left ventricle has no regional wall motion abnormalities. There is mild left ventricular hypertrophy. Left ventricular diastolic parameters are consistent with Grade I diastolic dysfunction (impaired relaxation). 2. Right ventricular systolic function is normal. The right ventricular size is normal. 3. No evidence of mitral valve regurgitation. 4. The aortic valve is calcified. Aortic valve regurgitation is not visualized. Moderate aortic valve stenosis. 5. The  inferior vena cava is normal in size with greater than 50% respiratory variability, suggesting right atrial pressure of 3 mmHg.  FINDINGS Left Ventricle: Left ventricular ejection fraction, by estimation, is 60 to 65%. The left ventricle has normal function. The left ventricle has no regional wall motion abnormalities. The left ventricular internal cavity size was normal in size. There is mild left ventricular hypertrophy. Left ventricular diastolic parameters are consistent with Grade I diastolic dysfunction (impaired relaxation).  Right Ventricle: The right ventricular size is normal. Right ventricular systolic function is normal.  Left Atrium: Left atrial size was normal in size.  Right Atrium: Right atrial size was normal in size.  Pericardium: There is no evidence of pericardial effusion.  Mitral Valve: No evidence of mitral valve regurgitation.  Tricuspid Valve: The tricuspid valve is normal in structure. Tricuspid valve regurgitation  Cardiology Office Note    Date:  05/25/2023  ID:  Carol Riley, Carol Riley June 08, 1941, MRN [id number] PCP:  Lynnea Ferrier, MD  Cardiologist:  None  Electrophysiologist:  None   Chief Complaint: Hospital follow up for syncopal event   History of Present Illness: Marland Kitchen    Carol Riley is a 82 y.o. 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   Carol Riley is a former patient of [facility], last seen 2019. She has history of cypher stents in the proximal left circumflex and mid RCA in 06/2004, repeat cardiac catheterization in 01/2012 revealed patent stents. In 2019 she returned to [facility] for increased shortness of breath. MPI in 04/2018 showed normal left ventricular function, normal wall motion and mild anterior wall ischemia. She was referred to pulmonology.   On 04/01/2023 Carol Riley presented to the ED via EMS after a fall at home.  Patient reports that she was at her house in the kitchen and the next thing she remembers waking up on the floor.  Prior to event she was in her normal state of health.  In the ED her troponin was negative x1 at 12, magnesium 1.5.  Given periorbital swelling and anticoagulated status EMS recommended transport to [facility] but patient requested to be taken to [facility].  Initial trauma workup found acute IVH, CT maxillofacial showed comminuted and displaced depressed left orbital floor fracture.  With repeat head CT showing increase in size of bleed patient was transferred to [facility] trauma ICU. Neurosurgery recommended conservative management. On ICU telemetry indicated an episode concerning for A-fib with RVR with spontaneous resolution. Patient was evaluated by Dr. Allyson Sabal on 04/04/23, on review of telemetry afib was not appreciated by Dr. Allyson Sabal, patient was in sinus rhythm with  isolated PACs. It was recommended she wear live ZIO monitor at discharge given fall without clear cause.  Echo during admission indicated an LVEF of 60 to 65%, LV with normal function, LV with no RWMA, mild LVH, G1 DD, moderate aortic valve stenosis, aortic valve mean gradient measuring 21.7 mmHg.  Cardiac monitor indicated a minimum heart rate of 62 beats p.m., max heart rate of 226 bpm and average heart rate of 87 bpm.  Predominant underlying rhythm was sinus rhythm.  She had 56 episodes of SVT, run with the fastest interval lasting 9 beats with a max rate of 226 bpm the longest lasting 24.9 seconds with an average rate of 116 bpm.  She had frequent PACs and occasional PVCs.  Her Toprol XL was increased to 25 mg daily.  Today she presents for follow up. She reports she is doing well. She denies any further presyncope or syncope.  She denies chest pain and feels that her shortness of breath is at her baseline if not overall improved.  Following her hospital admission she contracted COVID, with this she lost weight as she had a decreased appetite which is now improving.  She feels that her weight loss has also improved her shortness of breath.  She reports that this is the best she has felt in months.  Facial laceration has healed and has had no further headaches.  She is planning to follow-up with neurology this afternoon to discuss resuming Coumadin.  Has been working with PT and doing exercises at home and is tolerating this well.  Labwork independently reviewed:  Cardiology Office Note    Date:  05/25/2023  ID:  Carol Riley, Carol Riley June 08, 1941, MRN [id number] PCP:  Lynnea Ferrier, MD  Cardiologist:  None  Electrophysiologist:  None   Chief Complaint: Hospital follow up for syncopal event   History of Present Illness: Marland Kitchen    Carol Riley is a 82 y.o. 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   Carol Riley is a former patient of [facility], last seen 2019. She has history of cypher stents in the proximal left circumflex and mid RCA in 06/2004, repeat cardiac catheterization in 01/2012 revealed patent stents. In 2019 she returned to [facility] for increased shortness of breath. MPI in 04/2018 showed normal left ventricular function, normal wall motion and mild anterior wall ischemia. She was referred to pulmonology.   On 04/01/2023 Carol Riley presented to the ED via EMS after a fall at home.  Patient reports that she was at her house in the kitchen and the next thing she remembers waking up on the floor.  Prior to event she was in her normal state of health.  In the ED her troponin was negative x1 at 12, magnesium 1.5.  Given periorbital swelling and anticoagulated status EMS recommended transport to [facility] but patient requested to be taken to [facility].  Initial trauma workup found acute IVH, CT maxillofacial showed comminuted and displaced depressed left orbital floor fracture.  With repeat head CT showing increase in size of bleed patient was transferred to [facility] trauma ICU. Neurosurgery recommended conservative management. On ICU telemetry indicated an episode concerning for A-fib with RVR with spontaneous resolution. Patient was evaluated by Dr. Allyson Sabal on 04/04/23, on review of telemetry afib was not appreciated by Dr. Allyson Sabal, patient was in sinus rhythm with  isolated PACs. It was recommended she wear live ZIO monitor at discharge given fall without clear cause.  Echo during admission indicated an LVEF of 60 to 65%, LV with normal function, LV with no RWMA, mild LVH, G1 DD, moderate aortic valve stenosis, aortic valve mean gradient measuring 21.7 mmHg.  Cardiac monitor indicated a minimum heart rate of 62 beats p.m., max heart rate of 226 bpm and average heart rate of 87 bpm.  Predominant underlying rhythm was sinus rhythm.  She had 56 episodes of SVT, run with the fastest interval lasting 9 beats with a max rate of 226 bpm the longest lasting 24.9 seconds with an average rate of 116 bpm.  She had frequent PACs and occasional PVCs.  Her Toprol XL was increased to 25 mg daily.  Today she presents for follow up. She reports she is doing well. She denies any further presyncope or syncope.  She denies chest pain and feels that her shortness of breath is at her baseline if not overall improved.  Following her hospital admission she contracted COVID, with this she lost weight as she had a decreased appetite which is now improving.  She feels that her weight loss has also improved her shortness of breath.  She reports that this is the best she has felt in months.  Facial laceration has healed and has had no further headaches.  She is planning to follow-up with neurology this afternoon to discuss resuming Coumadin.  Has been working with PT and doing exercises at home and is tolerating this well.  Labwork independently reviewed:

## 2023-05-25 ENCOUNTER — Ambulatory Visit: Payer: Medicare HMO | Attending: Nurse Practitioner | Admitting: Cardiology

## 2023-05-25 ENCOUNTER — Encounter: Payer: Self-pay | Admitting: Nurse Practitioner

## 2023-05-25 VITALS — BP 120/60 | HR 78 | Ht 64.0 in | Wt 171.0 lb

## 2023-05-25 DIAGNOSIS — I350 Nonrheumatic aortic (valve) stenosis: Secondary | ICD-10-CM

## 2023-05-25 DIAGNOSIS — I10 Essential (primary) hypertension: Secondary | ICD-10-CM

## 2023-05-25 DIAGNOSIS — I2510 Atherosclerotic heart disease of native coronary artery without angina pectoris: Secondary | ICD-10-CM | POA: Diagnosis not present

## 2023-05-25 DIAGNOSIS — R55 Syncope and collapse: Secondary | ICD-10-CM

## 2023-05-25 DIAGNOSIS — I615 Nontraumatic intracerebral hemorrhage, intraventricular: Secondary | ICD-10-CM

## 2023-05-25 DIAGNOSIS — E782 Mixed hyperlipidemia: Secondary | ICD-10-CM

## 2023-05-25 DIAGNOSIS — Z6829 Body mass index (BMI) 29.0-29.9, adult: Secondary | ICD-10-CM | POA: Diagnosis not present

## 2023-05-25 NOTE — Patient Instructions (Signed)
Medication Instructions:  No changes *If you need a refill on your cardiac medications before your next appointment, please call your pharmacy*   Lab Work: None ordered If you have labs (blood work) drawn today and your tests are completely normal, you will receive your results only by: MyChart Message (if you have MyChart) OR A paper copy in the mail If you have any lab test that is abnormal or we need to change your treatment, we will call you to review the results.   Testing/Procedures: None ordered   Follow-Up: At [facility], you and your health needs are our priority.  As part of our continuing mission to provide you with exceptional heart care, we have created designated Provider Care Teams.  These Care Teams include your primary Cardiologist (physician) and Advanced Practice Providers (APPs -  Physician Assistants and Nurse Practitioners) who all work together to provide you with the care you need, when you need it.  We recommend signing up for the patient portal called "MyChart".  Sign up information is provided on this After Visit Summary.  MyChart is used to connect with patients for Virtual Visits (Telemedicine).  Patients are able to view lab/test results, encounter notes, upcoming appointments, etc.  Non-urgent messages can be sent to your provider as well.   To learn more about what you can do with MyChart, go to [email or web address].    Your next appointment:   3 month(s)  Provider:   First available cardiologist

## 2023-06-23 NOTE — Plan of Care (Signed)
 [facility] Tonsillectomy/Adenoidectomy, Postoperative PEDS care plan entered in error.

## 2023-07-11 ENCOUNTER — Emergency Department: Payer: Medicare HMO

## 2023-07-11 ENCOUNTER — Emergency Department
Admission: EM | Admit: 2023-07-11 | Discharge: 2023-07-11 | Disposition: A | Discharge status: Home / Self Care | Payer: Medicare HMO | Attending: Emergency Medicine | Admitting: Emergency Medicine

## 2023-07-11 ENCOUNTER — Encounter: Payer: Self-pay | Admitting: Emergency Medicine

## 2023-07-11 ENCOUNTER — Other Ambulatory Visit: Payer: Self-pay

## 2023-07-11 DIAGNOSIS — I672 Cerebral atherosclerosis: Secondary | ICD-10-CM | POA: Insufficient documentation

## 2023-07-11 DIAGNOSIS — W19XXXA Unspecified fall, initial encounter: Secondary | ICD-10-CM | POA: Diagnosis not present

## 2023-07-11 DIAGNOSIS — I10 Essential (primary) hypertension: Secondary | ICD-10-CM | POA: Diagnosis not present

## 2023-07-11 DIAGNOSIS — S0990XA Unspecified injury of head, initial encounter: Secondary | ICD-10-CM | POA: Diagnosis not present

## 2023-07-11 DIAGNOSIS — S199XXA Unspecified injury of neck, initial encounter: Secondary | ICD-10-CM | POA: Diagnosis not present

## 2023-07-11 DIAGNOSIS — I2510 Atherosclerotic heart disease of native coronary artery without angina pectoris: Secondary | ICD-10-CM | POA: Diagnosis not present

## 2023-07-11 DIAGNOSIS — Z743 Need for continuous supervision: Secondary | ICD-10-CM | POA: Diagnosis not present

## 2023-07-11 DIAGNOSIS — M1711 Unilateral primary osteoarthritis, right knee: Secondary | ICD-10-CM | POA: Diagnosis not present

## 2023-07-11 DIAGNOSIS — M25561 Pain in right knee: Secondary | ICD-10-CM | POA: Insufficient documentation

## 2023-07-11 DIAGNOSIS — W1830XA Fall on same level, unspecified, initial encounter: Secondary | ICD-10-CM | POA: Diagnosis not present

## 2023-07-11 MED ORDER — ACETAMINOPHEN 325 MG PO TABS
650.0000 mg | ORAL_TABLET | Freq: Once | ORAL | Status: AC
  Administered 2023-07-11: 650 mg via ORAL
  Filled 2023-07-11: qty 2

## 2023-07-11 NOTE — ED Triage Notes (Signed)
Patient to ED via [facility] from home for right knee pain . Patient states she fell in a hold in her deck and leg got stuck. Redness and swelling noted. Denies hitting head and does not take blood thinners.

## 2023-07-11 NOTE — ED Provider Notes (Signed)
[facility] Provider Note    Event Date/Time   First MD Initiated Contact with Patient 07/11/23 1513     (approximate)   History   Knee Injury   HPI  Carol Riley is a 82 y.o. female with a past medical history of intracranial hemorrhage, lymphedema who presents today for evaluation of right knee pain.  Patient reports that she stepped through a rotten piece of wood on her porch and fell through the porch, approximately 1.5 feet.  She hit her knee on the wood during the process.  She did not strike her head or lose consciousness.  She reports that she has pain in her knee only.  She has not had any trouble moving her knee or walking.  No headache, neck pain, nausea, vomiting, or other injury.  She reports that she is no longer on her Coumadin.  Patient Active Problem List   Diagnosis Date Noted   Syncope 04/04/2023   ICH (intracerebral hemorrhage) (HCC) 04/02/2023   IVH (intraventricular hemorrhage) (HCC) 04/02/2023   Loss of consciousness associated with intracranial injury (HCC) 04/02/2023   Chronic anticoagulation 04/02/2023   Pain in limb 04/23/2019   Lymphedema 10/23/2018   Hypertension 07/13/2018   Coronary artery disease 07/13/2018   Edema 07/13/2018   Lower limb ulcer, calf, left, with fat layer exposed (HCC) 07/13/2018          Physical Exam   Triage Vital Signs: ED Triage Vitals  Encounter Vitals Group     BP 07/11/23 1304 132/64     Systolic BP Percentile --      Diastolic BP Percentile --      Pulse Rate 07/11/23 1304 67     Resp 07/11/23 1304 18     Temp 07/11/23 1304 98 F (36.7 C)     Temp Source 07/11/23 1304 Oral     SpO2 07/11/23 1304 99 %     Weight 07/11/23 1303 175 lb (79.4 kg)     Height 07/11/23 1303 5\' 2"  (1.575 m)     Head Circumference --      Peak Flow --      Pain Score 07/11/23 1303 6     Pain Loc --      Pain Education --      Exclude from Growth Chart --     Most recent vital signs: Vitals:    07/11/23 1304  BP: 132/64  Pulse: 67  Resp: 18  Temp: 98 F (36.7 C)  SpO2: 99%    Physical Exam Vitals and nursing note reviewed.  Constitutional:      General: Awake and alert. No acute distress.    Appearance: Normal appearance. The patient is normal weight.  HENT:     Head: Normocephalic and atraumatic.     Mouth: Mucous membranes are moist.  Eyes:     General: PERRL. Normal EOMs        Right eye: No discharge.        Left eye: No discharge.     Conjunctiva/sclera: Conjunctivae normal.  Cardiovascular:     Rate and Rhythm: Normal rate and regular rhythm.     Pulses: Normal pulses.  Pulmonary:     Effort: Pulmonary effort is normal. No respiratory distress.     Breath sounds: Normal breath sounds.  Abdominal:     Abdomen is soft. There is no abdominal tenderness. No rebound or guarding. No distention. Musculoskeletal:        General: No swelling.  Normal range of motion.     Cervical back: Normal range of motion and neck supple. No midline cervical spine tenderness.  Full range of motion of neck.  Negative Spurling test.  Negative Lhermitte sign.  Normal strength and sensation in bilateral upper extremities. Normal grip strength bilaterally.  Normal intrinsic muscle function of the hand bilaterally.  Normal radial pulses bilaterally. Right knee: No deformity. Superficial abrasions noted. No joint line tenderness. No patellar tenderness, no ballotment Warm and well perfused extremity with 2+ pedal pulses 5/5 strength to dorsiflexion and plantarflexion at the ankle with intact sensation throughout extremity Normal range of motion of the knee, with intact flexion and extension to active and passive range of motion. Extensor mechanism intact. No ligamentous laxity. Negative anterior/posterior drawer/negative lachman, negative mcmurrays No effusion or warmth Intact quadriceps, hamstring function, patellar tendon function Pelvis stable Full ROM of ankle without pain or  swelling Foot warm and well perfused Skin:    General: Skin is warm and dry.     Capillary Refill: Capillary refill takes less than 2 seconds.     Findings: No rash.  Neurological:     Mental Status: The patient is awake and alert.  Neurological: GCS 15 alert and oriented x3 Normal speech, no expressive or receptive aphasia or dysarthria Cranial nerves II through XII intact Normal visual fields 5 out of 5 strength in all 4 extremities with intact sensation throughout No extremity drift Normal finger-to-nose testing, no limb or truncal ataxia      ED Results / Procedures / Treatments   Labs (all labs ordered are listed, but only abnormal results are displayed) Labs Reviewed - No data to display   EKG     RADIOLOGY I independently reviewed and interpreted imaging and agree with radiologists findings.     PROCEDURES:  Critical Care performed:   Procedures   MEDICATIONS ORDERED IN ED: Medications  acetaminophen (TYLENOL) tablet 650 mg (650 mg Oral Given 07/11/23 1503)     IMPRESSION / MDM / ASSESSMENT AND PLAN / ED COURSE  I reviewed the triage vital signs and the nursing notes.   Differential diagnosis includes, but is not limited to, abrasion, fracture, intracranial hemorrhage, contusion.  Patient is awake and alert, hemodynamically stable and afebrile.  She has full and normal range of motion of her knee, she is flexing extending fully, there is no effusion, warmth, however she does have abrasions as expected given the mechanism of injury.  She is ambulatory with a steady gait.  X-ray was obtained.  Given that she did have a fall and had a recent intraventricular hemorrhage, CT head and neck obtained, and these are negative for any acute findings.  Patient is reassured by these results.  She was treated symptomatically with Tylenol.  She requested to be discharged prior to the results of her x-ray of her knee.  She is ambulatory with a steady gait and has no  pain currently.  She was advised that she may find the results of her x-ray on MyChart.  We discussed return precautions and the importance of close outpatient follow-up.  Patient understands and agrees with plan.  She was discharged in stable condition.    X-ray resulted after patient was discharged and reveals no acute injuries.   Patient's presentation is most consistent with acute complicated illness / injury requiring diagnostic workup.      FINAL CLINICAL IMPRESSION(S) / ED DIAGNOSES   Final diagnoses:  Acute pain of right knee  Fall, initial encounter  Rx / DC Orders   ED Discharge Orders     None        Note:  This document was prepared using Dragon voice recognition software and may include unintentional dictation errors.   Keturah Shavers 07/11/23 1815    Jene Every, MD 07/11/23 [phone]

## 2023-07-11 NOTE — ED Notes (Signed)
Pt son at bedside, pt denies any needs at this time, pt educated about fall precautions and using the call light

## 2023-07-11 NOTE — Discharge Instructions (Signed)
Your CT head and neck was normal.  We do not yet have the results of your knee x-ray though you prefer to go home.  You may find the results on MyChart.  Please return for any new, worsening, or change in symptoms or other concerns.  It was a pleasure caring for you today.

## 2023-07-12 DIAGNOSIS — I739 Peripheral vascular disease, unspecified: Secondary | ICD-10-CM | POA: Diagnosis not present

## 2023-07-12 DIAGNOSIS — F32A Depression, unspecified: Secondary | ICD-10-CM | POA: Diagnosis not present

## 2023-07-12 DIAGNOSIS — I10 Essential (primary) hypertension: Secondary | ICD-10-CM | POA: Diagnosis not present

## 2023-07-12 DIAGNOSIS — D692 Other nonthrombocytopenic purpura: Secondary | ICD-10-CM | POA: Diagnosis not present

## 2023-07-12 DIAGNOSIS — R7303 Prediabetes: Secondary | ICD-10-CM | POA: Diagnosis not present

## 2023-07-12 DIAGNOSIS — J9611 Chronic respiratory failure with hypoxia: Secondary | ICD-10-CM | POA: Diagnosis not present

## 2023-07-12 DIAGNOSIS — Z23 Encounter for immunization: Secondary | ICD-10-CM | POA: Diagnosis not present

## 2023-07-12 DIAGNOSIS — I2510 Atherosclerotic heart disease of native coronary artery without angina pectoris: Secondary | ICD-10-CM | POA: Diagnosis not present

## 2023-07-12 DIAGNOSIS — J449 Chronic obstructive pulmonary disease, unspecified: Secondary | ICD-10-CM | POA: Diagnosis not present

## 2023-07-12 DIAGNOSIS — E785 Hyperlipidemia, unspecified: Secondary | ICD-10-CM | POA: Diagnosis not present

## 2023-07-12 DIAGNOSIS — D696 Thrombocytopenia, unspecified: Secondary | ICD-10-CM | POA: Diagnosis not present

## 2023-07-12 DIAGNOSIS — Z0000 Encounter for general adult medical examination without abnormal findings: Secondary | ICD-10-CM | POA: Diagnosis not present

## 2023-07-19 DIAGNOSIS — J432 Centrilobular emphysema: Secondary | ICD-10-CM | POA: Diagnosis not present

## 2023-08-17 DIAGNOSIS — D485 Neoplasm of uncertain behavior of skin: Secondary | ICD-10-CM | POA: Diagnosis not present

## 2023-08-17 DIAGNOSIS — Z85828 Personal history of other malignant neoplasm of skin: Secondary | ICD-10-CM | POA: Diagnosis not present

## 2023-08-17 DIAGNOSIS — L821 Other seborrheic keratosis: Secondary | ICD-10-CM | POA: Diagnosis not present

## 2023-08-17 DIAGNOSIS — D225 Melanocytic nevi of trunk: Secondary | ICD-10-CM | POA: Diagnosis not present

## 2023-08-17 DIAGNOSIS — D2272 Melanocytic nevi of left lower limb, including hip: Secondary | ICD-10-CM | POA: Diagnosis not present

## 2023-08-17 DIAGNOSIS — L239 Allergic contact dermatitis, unspecified cause: Secondary | ICD-10-CM | POA: Diagnosis not present

## 2023-08-17 DIAGNOSIS — B078 Other viral warts: Secondary | ICD-10-CM | POA: Diagnosis not present

## 2023-08-17 DIAGNOSIS — D2261 Melanocytic nevi of right upper limb, including shoulder: Secondary | ICD-10-CM | POA: Diagnosis not present

## 2023-08-17 DIAGNOSIS — D2271 Melanocytic nevi of right lower limb, including hip: Secondary | ICD-10-CM | POA: Diagnosis not present

## 2023-08-17 DIAGNOSIS — L570 Actinic keratosis: Secondary | ICD-10-CM | POA: Diagnosis not present

## 2023-08-17 DIAGNOSIS — D2262 Melanocytic nevi of left upper limb, including shoulder: Secondary | ICD-10-CM | POA: Diagnosis not present

## 2023-08-18 DIAGNOSIS — J432 Centrilobular emphysema: Secondary | ICD-10-CM | POA: Diagnosis not present

## 2023-08-28 ENCOUNTER — Ambulatory Visit: Payer: Medicare HMO | Admitting: Medical

## 2023-08-30 ENCOUNTER — Ambulatory Visit
Admission: EM | Admit: 2023-08-30 | Discharge: 2023-08-30 | Disposition: A | Discharge status: Home / Self Care | Payer: Medicare HMO | Attending: Emergency Medicine | Admitting: Emergency Medicine

## 2023-08-30 ENCOUNTER — Encounter: Payer: Self-pay | Admitting: Emergency Medicine

## 2023-08-30 ENCOUNTER — Ambulatory Visit (INDEPENDENT_AMBULATORY_CARE_PROVIDER_SITE_OTHER): Payer: Medicare HMO

## 2023-08-30 DIAGNOSIS — R0602 Shortness of breath: Secondary | ICD-10-CM | POA: Diagnosis not present

## 2023-08-30 DIAGNOSIS — J441 Chronic obstructive pulmonary disease with (acute) exacerbation: Secondary | ICD-10-CM | POA: Diagnosis not present

## 2023-08-30 DIAGNOSIS — R051 Acute cough: Secondary | ICD-10-CM | POA: Diagnosis not present

## 2023-08-30 DIAGNOSIS — R059 Cough, unspecified: Secondary | ICD-10-CM | POA: Diagnosis not present

## 2023-08-30 MED ORDER — ALBUTEROL SULFATE HFA 108 (90 BASE) MCG/ACT IN AERS: 2.0000 | INHALATION_SPRAY | RESPIRATORY_TRACT | 0 refills | Status: DC | PRN

## 2023-08-30 MED ORDER — DEXAMETHASONE SODIUM PHOSPHATE 10 MG/ML IJ SOLN
10.0000 mg | Freq: Once | INTRAMUSCULAR | Status: AC
  Administered 2023-08-30: 10 mg via INTRAMUSCULAR

## 2023-08-30 MED ORDER — PROMETHAZINE-DM 6.25-15 MG/5ML PO SYRP: 5.0000 mL | ORAL_SOLUTION | Freq: Four times a day (QID) | ORAL | 0 refills | Status: DC | PRN

## 2023-08-30 MED ORDER — BENZONATATE 100 MG PO CAPS: 200.0000 mg | ORAL_CAPSULE | Freq: Three times a day (TID) | ORAL | 0 refills | Status: DC

## 2023-08-30 MED ORDER — AEROCHAMBER MV MISC: 2 refills | Status: AC

## 2023-08-30 MED ORDER — DOXYCYCLINE HYCLATE 100 MG PO CAPS: 100.0000 mg | ORAL_CAPSULE | Freq: Two times a day (BID) | ORAL | 0 refills | Status: AC

## 2023-08-30 MED ORDER — PREDNISONE 20 MG PO TABS: 60.0000 mg | ORAL_TABLET | Freq: Every day | ORAL | 0 refills | Status: AC

## 2023-08-30 MED ORDER — IPRATROPIUM-ALBUTEROL 0.5-2.5 (3) MG/3ML IN SOLN
3.0000 mL | Freq: Once | RESPIRATORY_TRACT | Status: AC
  Administered 2023-08-30: 3 mL via RESPIRATORY_TRACT

## 2023-08-30 NOTE — ED Provider Notes (Signed)
 [facility]    CSN: [id number] Arrival date & time: 08/30/23  1332      History   Chief Complaint Chief Complaint  Patient presents with   Cough   Fatigue    HPI Carol Riley is a 83 y.o. female.   HPI  83 year old female with a past medical history significant for PSVT, CAD, sleep apnea, hypertension, DVT, and neuropathy presents for evaluation of worsening shortness of breath x 8 days.  She typically wears oxygen only at night for management of her COPD and she is not on any inhalers but since she became ill she has had to wear oxygen more continuously.  She does endorse runny nose and nasal congestion, a cough that is productive for green and brown sputum, shortness breath, and wheezing.  She denies any fevers.  She reports that she does not have a rescue inhaler to use at home.  She has contacted her PCP at [facility] clinic who advised her to use over-the-counter cold medication.  Past Medical History:  Diagnosis Date   Arthritis    Cancer (HCC)    cervical cancer   Cataract cortical, senile    COPD (chronic obstructive pulmonary disease) (HCC)    Coronary artery disease    a. 06/2004 PCI: pLCX and RCA - Cypher DESs; b. 01/2012 Cath: patent stents->med rx; c. 04/2018 MV: mild ant ischemia   Depression    DVT (deep venous thrombosis) (HCC)    a. prev on warfarin.   Edema    FEET/LEGS   Hyperglycemia    Hypertension    Leukopenia    Moderate aortic stenosis    a. 04/2018 Echo: EF>55%, mild MR/TR/PR/AS; b. 07/2021 Echo: EF>55%, mod AS; c. 07/2022 Echo: EF>55%, mod AS, mild TR/PR; d. 03/2023 Echo: EF 60-65%, no rwma, mild LVH, GrI DD, nl RV fxn, mild MR, mod AS (mean/peak grad 21.7/37.7 mmHg).   Neuropathy    Peripheral neuropathy    PSVT (paroxysmal supraventricular tachycardia) (HCC)    a. 03/2023 Zio: 56 SVT runs, fastest 226 x 9 beats, longest 24.9 secs @ 116 bpm-->bb increased.   Senile purpura (HCC)    Sleep apnea    Thrombocytopenia (HCC)     Patient  Active Problem List   Diagnosis Date Noted   Syncope 04/04/2023   ICH (intracerebral hemorrhage) (HCC) 04/02/2023   IVH (intraventricular hemorrhage) (HCC) 04/02/2023   Loss of consciousness associated with intracranial injury (HCC) 04/02/2023   Chronic anticoagulation 04/02/2023   Pain in limb 04/23/2019   Lymphedema 10/23/2018   Hypertension 07/13/2018   Coronary artery disease 07/13/2018   Edema 07/13/2018   Lower limb ulcer, calf, left, with fat layer exposed (HCC) 07/13/2018    Past Surgical History:  Procedure Laterality Date   BACK SURGERY     BREAST BIOPSY Left    benign needle   CHONDROPLASTY Left 05/13/2019   Procedure: CHONDROPLASTY;  Surgeon: Tobie Priest, MD;  Location: [facility] ORS;  Service: Orthopedics;  Laterality: Left;   COLONOSCOPY     COLONOSCOPY WITH PROPOFOL  N/A 02/24/2017   Procedure: COLONOSCOPY WITH PROPOFOL ;  Surgeon: Gaylyn Gladis PENNER, MD;  Location: [facility] ENDOSCOPY;  Service: Endoscopy;  Laterality: N/A;   CORONARY ANGIOPLASTY     STENT   EYE SURGERY     GAS INSERTION Right 05/06/2015   Procedure: INSERTION OF GAS;  Surgeon: Harlene Scarce, MD;  Location: [facility] ORS;  Service: Ophthalmology;  Laterality: Right;   KNEE ARTHROSCOPY WITH MEDIAL MENISECTOMY Left 05/13/2019   Procedure:  KNEE ARTHROSCOPY WITH PARTIAL  MEDIAL and lateral MENISECTOMY;  Surgeon: Tobie Priest, MD;  Location: [facility] ORS;  Service: Orthopedics;  Laterality: Left;   MEMBRANE PEEL Right 05/06/2015   Procedure: MEMBRANE PEEL;  Surgeon: Harlene Scarce, MD;  Location: [facility] ORS;  Service: Ophthalmology;  Laterality: Right;   PARS PLANA VITRECTOMY Right 05/06/2015   Procedure: PARS PLANA VITRECTOMY WITH 23 GAUGE;  Surgeon: Harlene Scarce, MD;  Location: [facility] ORS;  Service: Ophthalmology;  Laterality: Right;    OB History   No obstetric history on file.      Home Medications    Prior to Admission medications   Medication Sig Start Date End Date Taking? Authorizing Provider  albuterol   (VENTOLIN  HFA) 108 (90 Base) MCG/ACT inhaler Inhale 2 puffs into the lungs every 4 (four) hours as needed. 08/30/23  Yes Bernardino Ditch, NP  benzonatate  (TESSALON ) 100 MG capsule Take 2 capsules (200 mg total) by mouth every 8 (eight) hours. 08/30/23  Yes Bernardino Ditch, NP  Cholecalciferol (VITAMIN D3 PO) Take 1 tablet by mouth daily.   Yes [provider]  citalopram (CELEXA) 20 MG tablet Take 20 mg by mouth daily.   Yes [provider]  clobetasol ointment (TEMOVATE) 0.05 % Apply 1 Application topically as directed.  APPLY FINGERTIP SIZE AMOUNT TO AFFECTED AREA 2-3 TIMES WEEKLY FOR MAINTENANCE   Yes [provider]  doxycycline  (VIBRAMYCIN ) 100 MG capsule Take 1 capsule (100 mg total) by mouth 2 (two) times daily for 7 days. 08/30/23 09/06/23 Yes Bernardino Ditch, NP  gabapentin (NEURONTIN) 300 MG capsule Take 1 capsule by mouth 2 (two) times daily. 07/12/23 07/11/24 Yes [provider]  hydrocortisone  cream 1 % Apply 1 application topically daily as needed for itching.   Yes [provider]  lisinopril  (ZESTRIL ) 40 MG tablet Take 40 mg by mouth daily.   Yes [provider]  metoprolol  succinate (TOPROL -XL) 25 MG 24 hr tablet Take 1 tablet (25 mg total) by mouth daily. 04/27/23  Yes Henry Manuelita NOVAK, NP  Multiple Vitamin (MULTIVITAMIN) tablet Take 1 tablet by mouth daily.   Yes [provider]  predniSONE  (DELTASONE ) 20 MG tablet Take 3 tablets (60 mg total) by mouth daily with breakfast for 5 days. 3 tablets daily for 5 days. 08/30/23 09/04/23 Yes Bernardino Ditch, NP  promethazine -dextromethorphan (PROMETHAZINE -DM) 6.25-15 MG/5ML syrup Take 5 mLs by mouth 4 (four) times daily as needed. 08/30/23  Yes Bernardino Ditch, NP  simvastatin  (ZOCOR ) 20 MG tablet Take 20 mg by mouth at bedtime.    Yes [provider]  Spacer/Aero-Holding Chambers (AEROCHAMBER MV) inhaler Use as instructed 08/30/23  Yes Bernardino Ditch, NP    Family History Family History  Problem  Relation Age of Onset   Breast cancer Sister 32   Cancer Mother     Social History Social History   Tobacco Use   Smoking status: Former   Smokeless tobacco: Never  Vaping Use   Vaping status: Never Used  Substance Use Topics   Alcohol use: No   Drug use: No     Allergies   Pneumovax [pneumococcal polysaccharide vaccine] and Tizanidine   Review of Systems Review of Systems  Constitutional:  Positive for fatigue. Negative for fever.  HENT:  Positive for congestion and rhinorrhea.   Respiratory:  Positive for cough, shortness of breath and wheezing.   Cardiovascular:  Negative for leg swelling.     Physical Exam Triage Vital Signs ED Triage Vitals  Encounter Vitals Group  BP      Systolic BP Percentile      Diastolic BP Percentile      Pulse      Resp      Temp      Temp src      SpO2      Weight      Height      Head Circumference      Peak Flow      Pain Score      Pain Loc      Pain Education      Exclude from Growth Chart    No data found.  Updated Vital Signs BP 133/73 (BP Location: Right Arm)   Pulse 93   Temp 98.9 F (37.2 C) (Oral)   Resp 16   SpO2 95%   Visual Acuity Right Eye Distance:   Left Eye Distance:   Bilateral Distance:    Right Eye Near:   Left Eye Near:    Bilateral Near:     Physical Exam Vitals and nursing note reviewed.  Constitutional:      General: She is in acute distress.     Comments: Mild respiratory distress.  Cardiovascular:     Rate and Rhythm: Normal rate and regular rhythm.     Pulses: Normal pulses.     Heart sounds: Normal heart sounds. No murmur heard.    No friction rub. No gallop.  Pulmonary:     Effort: Pulmonary effort is normal.     Breath sounds: Wheezing and rhonchi present.     Comments: Wheezes and rhonchi diffusely. Musculoskeletal:     Right lower leg: No edema.     Left lower leg: No edema.  Skin:    General: Skin is warm and dry.     Capillary Refill: Capillary refill takes  less than 2 seconds.  Neurological:     General: No focal deficit present.     Mental Status: She is alert and oriented to person, place, and time.      UC Treatments / Results  Labs (all labs ordered are listed, but only abnormal results are displayed) Labs Reviewed - No data to display  EKG Sinus rhythm with premature atrial complexes Ventricular rate 94 bpm Peer interval 164 ms QRS duration 76 ms QT/QTc 382/477 ms Abnormal EKG.   Radiology DG Chest 2 View Result Date: 08/30/2023 CLINICAL DATA:  Shortness breath and cough x 8 days. EXAM: CHEST - 2 VIEW COMPARISON:  06/18/2018 chest radiograph. FINDINGS: Stable cardiomediastinal silhouette with normal heart size. No pneumothorax. No pleural effusion. Lungs appear clear, with no acute consolidative airspace disease and no pulmonary edema. IMPRESSION: No active cardiopulmonary disease. Electronically Signed   By: Selinda DELENA Blue M.D.   On: 08/30/2023 15:47    Procedures Procedures (including critical care time)  Medications Ordered in UC Medications  dexamethasone  (DECADRON ) injection 10 mg (has no administration in time range)  ipratropium-albuterol  (DUONEB) 0.5-2.5 (3) MG/3ML nebulizer solution 3 mL (3 mLs Nebulization Given 08/30/23 1450)    Initial Impression / Assessment and Plan / UC Course  I have reviewed the triage vital signs and the nursing notes.  Pertinent labs & imaging results that were available during my care of the patient were reviewed by me and considered in my medical decision making (see chart for details).   Patient is a pleasant 83 year old female presenting for evaluation of worsening respiratory complaints as outlined HPI above.  In the exam room she is able to  speak in full sentence and does display mild dyspnea and tachypnea.  Room air oxygen saturation was initially 85% and has come up to 96% on 2 L via nasal cannula.  She does have a diagnosis of COPD but is not on any rescue inhaler or inhaled  corticosteroids as COPD management.  She typically only wears oxygen at night but has been having to wear it more frequently throughout the day to help with her shortness of breath.  She does have a history of CAD but no history of CHF.  Her physical exam reveals S1-S2 heart sounds with regular rate and rhythm but she has diffuse wheezes and rhonchi.  Bilateral lower extremities are warm, dry, and free of edema.  They do have a dusky hue due to her peripheral vascular disease but her DP and PT pulses are 2+.  The patient reports that she is experiencing chest pain but she is unsure if that is related to the amount of coughing that she has been experiencing as she states the chest pain is not there all the time.  I will order a DuoNeb, chest x-ray, and EKG.  EKG shows sinus rhythm with premature atrial complexes but no ST or T wave abnormalities.  Computer is reading but cannot rule out age undetermined anterior infarct.  This was also present on EKG dated 05/25/2023.  Chest x-ray independently reviewed and evaluated by me.  Impression: Pulmonary vasculature is prominent.  Retrocardiac opacity visualized in the lateral view suspicious for pneumonia.  Radiology overread is pending. Radiology impression states no active cardiopulmonary disease.  Patient has portable oxygen at home and the son will go home to get the portable oxygen to transport her home with.  We will do a trial of outpatient antibiotic therapy with doxycycline  100 mg twice daily for 7 days for COPD exacerbation.  I will also prescribe her an albuterol  inhaler with a spacer and she can use 1 to 2 puffs every 4-6 hours as needed for shortness of breath or wheezing.  I will have staff administer 10 mg of IM Decadron  here in clinic and discharge her home on prednisone  60 mg burst dose for the next 5 days starting tomorrow morning.  I discussed with the patient and her son that if she does not feel that her shortness of breath is improving, or if it  seems to be worsening, that she needs to call 911 and go to the emergency department.   Final Clinical Impressions(s) / UC Diagnoses   Final diagnoses:  Acute cough  COPD exacerbation (HCC)     Discharge Instructions      Take the doxycycline  100 mg twice daily with food for 7 days for treatment of your respiratory infection and COPD exacerbation.  Use the albuterol  inhaler, with the spacer, 1 to 2 puffs every 4-6 hours, as needed for any shortness of breath or wheezing.  Continue to supplement with your home oxygen as needed for shortness of breath and wheezing.  Starting tomorrow morning at breakfast time you are going to take prednisone  60 mg daily.  He will take it each day for the next 5 days.  This will help decrease airway inflammation and make it easier for you to breathe.  Use the Tessalon  Perles every 8 hours during the day send for cough.  Take them with a small sip of water.  They may give you a numbness to the base of your tongue or metallic taste in your mouth, this is normal.  Use the Promethazine  DM cough syrup at bedtime as needed for cough and congestion.  If you develop any worsening shortness of breath, or feel like your shortness of breath is not improving, I want you to call 911 and go to the emergency department for evaluation.     ED Prescriptions     Medication Sig Dispense Auth. Provider   Spacer/Aero-Holding Chambers (AEROCHAMBER MV) inhaler Use as instructed 1 each Bernardino Ditch, NP   albuterol  (VENTOLIN  HFA) 108 (90 Base) MCG/ACT inhaler Inhale 2 puffs into the lungs every 4 (four) hours as needed. 18 g Bernardino Ditch, NP   benzonatate  (TESSALON ) 100 MG capsule Take 2 capsules (200 mg total) by mouth every 8 (eight) hours. 21 capsule Bernardino Ditch, NP   doxycycline  (VIBRAMYCIN ) 100 MG capsule Take 1 capsule (100 mg total) by mouth 2 (two) times daily for 7 days. 14 capsule Bernardino Ditch, NP   promethazine -dextromethorphan (PROMETHAZINE -DM) 6.25-15 MG/5ML  syrup Take 5 mLs by mouth 4 (four) times daily as needed. 118 mL Bernardino Ditch, NP   predniSONE  (DELTASONE ) 20 MG tablet Take 3 tablets (60 mg total) by mouth daily with breakfast for 5 days. 3 tablets daily for 5 days. 15 tablet Bernardino Ditch, NP      PDMP not reviewed this encounter.   Bernardino Ditch, NP 08/30/23 [phone]

## 2023-08-30 NOTE — ED Triage Notes (Signed)
 Sx x 1 week Patient is on o2 at night when she sleeps. She's been using it more through out the day Productive cough with green brown mucus.  Fatigue Unsure of fever wheezing  Copd

## 2023-08-30 NOTE — Discharge Instructions (Addendum)
 Take the doxycycline  100 mg twice daily with food for 7 days for treatment of your respiratory infection and COPD exacerbation.  Use the albuterol  inhaler, with the spacer, 1 to 2 puffs every 4-6 hours, as needed for any shortness of breath or wheezing.  Continue to supplement with your home oxygen as needed for shortness of breath and wheezing.  Starting tomorrow morning at breakfast time you are going to take prednisone  60 mg daily.  He will take it each day for the next 5 days.  This will help decrease airway inflammation and make it easier for you to breathe.  Use the Tessalon  Perles every 8 hours during the day send for cough.  Take them with a small sip of water.  They may give you a numbness to the base of your tongue or metallic taste in your mouth, this is normal.  Use the Promethazine  DM cough syrup at bedtime as needed for cough and congestion.  If you develop any worsening shortness of breath, or feel like your shortness of breath is not improving, I want you to call 911 and go to the emergency department for evaluation.

## 2023-09-18 DIAGNOSIS — J432 Centrilobular emphysema: Secondary | ICD-10-CM | POA: Diagnosis not present

## 2023-09-22 ENCOUNTER — Ambulatory Visit: Payer: Medicare HMO | Attending: Medical | Admitting: Medical

## 2023-09-22 ENCOUNTER — Encounter: Payer: Self-pay | Admitting: Medical

## 2023-09-22 VITALS — HR 68 | Ht 63.0 in | Wt 173.4 lb

## 2023-09-22 DIAGNOSIS — R55 Syncope and collapse: Secondary | ICD-10-CM

## 2023-09-22 DIAGNOSIS — I9589 Other hypotension: Secondary | ICD-10-CM

## 2023-09-22 DIAGNOSIS — I2510 Atherosclerotic heart disease of native coronary artery without angina pectoris: Secondary | ICD-10-CM

## 2023-09-22 DIAGNOSIS — I350 Nonrheumatic aortic (valve) stenosis: Secondary | ICD-10-CM | POA: Diagnosis not present

## 2023-09-22 DIAGNOSIS — E782 Mixed hyperlipidemia: Secondary | ICD-10-CM

## 2023-09-22 DIAGNOSIS — Z86718 Personal history of other venous thrombosis and embolism: Secondary | ICD-10-CM | POA: Diagnosis not present

## 2023-09-22 MED ORDER — LISINOPRIL 20 MG PO TABS: 20.0000 mg | ORAL_TABLET | Freq: Every day | ORAL | 3 refills | Status: DC

## 2023-09-22 NOTE — Patient Instructions (Signed)
Medication Instructions:  Your physician recommends the following medication changes.  DECREASE: Lisinopril 20 mg by mouth daily   *If you need a refill on your cardiac medications before your next appointment, please call your pharmacy*   Lab Work: No labs ordered today    Testing/Procedures: No test ordered today    Follow-Up: At [facility], you and your health needs are our priority.  As part of our continuing mission to provide you with exceptional heart care, we have created designated Provider Care Teams.  These Care Teams include your primary Cardiologist (physician) and Advanced Practice Providers (APPs -  Physician Assistants and Nurse Practitioners) who all work together to provide you with the care you need, when you need it.  We recommend signing up for the patient portal called "MyChart".  Sign up information is provided on this After Visit Summary.  MyChart is used to connect with patients for Virtual Visits (Telemedicine).  Patients are able to view lab/test results, encounter notes, upcoming appointments, etc.  Non-urgent messages can be sent to your provider as well.   To learn more about what you can do with MyChart, go to [email or web address].    Your next appointment:   6 month(s)  Provider:   Terrilee Croak, PA-C

## 2023-09-22 NOTE — Progress Notes (Unsigned)
Cardiology Office Note:    Date:  09/22/2023   ID:  MAILEY LANDSTROM, DOB 1941-03-03, MRN [id number]  PCP:  Lynnea Ferrier, MD  [facility] Cardiologist:  None  [facility] Electrophysiologist:  None   Referring MD: Lynnea Ferrier, MD   Chief Complaint: 3 month follow-up  History of Present Illness:    Carol Riley is a 83 y.o. 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    Carol Riley is a former patient of [facility], last seen 2019. She has history of cypher stents in the proximal left circumflex and mid RCA in 06/2004, repeat cardiac catheterization in 01/2012 revealed patent stents. In 2019 she returned to [facility] for increased shortness of breath. MPI in 04/2018 showed normal left ventricular function, normal wall motion and mild anterior wall ischemia. She was referred to pulmonology.    On 04/01/2023 Carol Riley presented to the ED via EMS after a fall at home.  Patient reports that she was at her house in the kitchen and the next thing she remembers waking up on the floor.  Prior to event she was in her normal state of health.  In the ED her troponin was negative x1 at 12, magnesium 1.5.  Given periorbital swelling and anticoagulated status EMS recommended transport to [facility] but patient requested to be taken to [facility].  Initial trauma workup found acute IVH, CT maxillofacial showed comminuted and displaced depressed left orbital floor fracture.  With repeat head CT showing increase in size of bleed patient was transferred to [facility] trauma ICU. Neurosurgery recommended conservative management. On ICU telemetry indicated an episode concerning for A-fib with RVR with spontaneous resolution. Patient was evaluated by Dr. Allyson Sabal on 04/04/23, on review of telemetry afib was not appreciated by Dr. Allyson Sabal,  patient was in sinus rhythm with isolated PACs. It was recommended she wear live ZIO monitor at discharge given fall without clear cause.  Echo during admission indicated an LVEF of 60 to 65%, LV with normal function, LV with no RWMA, mild LVH, G1 DD, moderate aortic valve stenosis, aortic valve mean gradient measuring 21.7 mmHg.   Cardiac monitor indicated a minimum heart rate of 62 beats p.m., max heart rate of 226 bpm and average heart rate of 87 bpm.  Predominant underlying rhythm was sinus rhythm.  She had 56 episodes of SVT, run with the fastest interval lasting 9 beats with a max rate of 226 bpm the longest lasting 24.9 seconds with an average rate of 116 bpm.  She had frequent PACs and occasional PVCs.  Her Toprol XL was increased to 25 mg daily.  The patient was last seen 04/2023 and was stable from a cardiac perspective.   Today, the patient reports she is overall doing well from a heart perspective. She has back pain. She has occasional chest discomfort. She has SOB and uses O2 at home. She feels dizzy and lightheaded sometimes. BP is low. No falls or passing out. No heart racing or palpitations.   Past Medical History:  Diagnosis Date   Arthritis    Cancer (HCC)    cervical cancer   Cataract cortical, senile    COPD (chronic obstructive pulmonary disease) (HCC)    Coronary artery disease    a. 06/2004 PCI: pLCX and RCA - Cypher DESs; b. 01/2012 Cath: patent stents->med  rx; c. 04/2018 MV: mild ant ischemia   Depression    DVT (deep venous thrombosis) (HCC)    a. prev on warfarin.   Edema    FEET/LEGS   Hyperglycemia    Hypertension    Leukopenia    Moderate aortic stenosis    a. 04/2018 Echo: EF>55%, mild MR/TR/PR/AS; b. 07/2021 Echo: EF>55%, mod AS; c. 07/2022 Echo: EF>55%, mod AS, mild TR/PR; d. 03/2023 Echo: EF 60-65%, no rwma, mild LVH, GrI DD, nl RV fxn, mild MR, mod AS (mean/peak grad 21.7/37.7 mmHg).   Neuropathy    Peripheral neuropathy    PSVT (paroxysmal supraventricular  tachycardia) (HCC)    a. 03/2023 Zio: 56 SVT runs, fastest 226 x 9 beats, longest 24.9 secs @ 116 bpm-->bb increased.   Senile purpura (HCC)    Sleep apnea    Thrombocytopenia (HCC)     Past Surgical History:  Procedure Laterality Date   BACK SURGERY     BREAST BIOPSY Left    benign needle   CHONDROPLASTY Left 05/13/2019   Procedure: CHONDROPLASTY;  Surgeon: Signa Kell, MD;  Location: [facility] ORS;  Service: Orthopedics;  Laterality: Left;   COLONOSCOPY     COLONOSCOPY WITH PROPOFOL N/A 02/24/2017   Procedure: COLONOSCOPY WITH PROPOFOL;  Surgeon: Christena Deem, MD;  Location: [facility] ENDOSCOPY;  Service: Endoscopy;  Laterality: N/A;   CORONARY ANGIOPLASTY     STENT   EYE SURGERY     GAS INSERTION Right 05/06/2015   Procedure: INSERTION OF GAS;  Surgeon: Marcelene Butte, MD;  Location: [facility] ORS;  Service: Ophthalmology;  Laterality: Right;   KNEE ARTHROSCOPY WITH MEDIAL MENISECTOMY Left 05/13/2019   Procedure: KNEE ARTHROSCOPY WITH PARTIAL  MEDIAL and lateral MENISECTOMY;  Surgeon: Signa Kell, MD;  Location: [facility] ORS;  Service: Orthopedics;  Laterality: Left;   MEMBRANE PEEL Right 05/06/2015   Procedure: MEMBRANE PEEL;  Surgeon: Marcelene Butte, MD;  Location: [facility] ORS;  Service: Ophthalmology;  Laterality: Right;   PARS PLANA VITRECTOMY Right 05/06/2015   Procedure: PARS PLANA VITRECTOMY WITH 23 GAUGE;  Surgeon: Marcelene Butte, MD;  Location: [facility] ORS;  Service: Ophthalmology;  Laterality: Right;    Current Medications: Current Meds  Medication Sig   albuterol (VENTOLIN HFA) 108 (90 Base) MCG/ACT inhaler Inhale 2 puffs into the lungs every 4 (four) hours as needed.   aspirin EC 81 MG tablet Take 81 mg by mouth daily. Swallow whole.   citalopram (CELEXA) 20 MG tablet Take 20 mg by mouth daily.   clobetasol ointment (TEMOVATE) 0.05 % Apply 1 Application topically as directed.  APPLY FINGERTIP SIZE AMOUNT TO AFFECTED AREA 2-3 TIMES WEEKLY FOR MAINTENANCE   gabapentin (NEURONTIN) 300 MG  capsule Take 1 capsule by mouth 2 (two) times daily.   hydrocortisone cream 1 % Apply 1 application topically daily as needed for itching.   metoprolol succinate (TOPROL-XL) 25 MG 24 hr tablet Take 1 tablet (25 mg total) by mouth daily.   Multiple Vitamin (MULTIVITAMIN) tablet Take 1 tablet by mouth daily.   simvastatin (ZOCOR) 20 MG tablet Take 20 mg by mouth at bedtime.    Spacer/Aero-Holding Chambers (AEROCHAMBER MV) inhaler Use as instructed   [DISCONTINUED] lisinopril (ZESTRIL) 40 MG tablet Take 40 mg by mouth daily.     Allergies:   Pneumovax [pneumococcal polysaccharide vaccine] and Tizanidine   Social History   Socioeconomic History   Marital status: Divorced    Spouse name: Not on file   Number of children: Not on file   Years  of education: Not on file   Highest education level: Not on file  Occupational History   Not on file  Tobacco Use   Smoking status: Former   Smokeless tobacco: Never  Vaping Use   Vaping status: Never Used  Substance and Sexual Activity   Alcohol use: No   Drug use: No   Sexual activity: Not on file  Other Topics Concern   Not on file  Social History Narrative   Not on file   Social Drivers of Health   Financial Resource Strain: Not on file  Food Insecurity: Food Insecurity Present (04/05/2023)   Hunger Vital Sign    Worried About Running Out of Food in the Last Year: Sometimes true    Ran Out of Food in the Last Year: Sometimes true  Transportation Needs: No Transportation Needs (04/05/2023)   PRAPARE - Administrator, Civil Service (Medical): No    Lack of Transportation (Non-Medical): No  Physical Activity: Not on file  Stress: Not on file  Social Connections: Not on file     Family History: The patient's family history includes Breast cancer (age of onset: 66) in her sister; Cancer in her mother.  ROS:   Please see the history of present illness.     All other systems reviewed and are negative.  EKGs/Labs/Other  Studies Reviewed:    The following studies were reviewed today: ***  EKG:  EKG is *** ordered today.  The ekg ordered today demonstrates ***  Recent Labs: 04/04/2023: BUN 18; Creatinine, Ser 1.12; Hemoglobin 13.7; Magnesium 2.0; Platelets 159; Potassium 4.0; Sodium 135  Recent Lipid Panel No results found for: "CHOL", "TRIG", "HDL", "CHOLHDL", "VLDL", "LDLCALC", "LDLDIRECT"   Risk Assessment/Calculations:   {Does this patient have ATRIAL FIBRILLATION?:[id number]}   Physical Exam:    VS:  Pulse 68   Ht 5\' 3"  (1.6 m)   Wt 173 lb 6.4 oz (78.7 kg)   SpO2 93%   BMI 30.72 kg/m     Wt Readings from Last 3 Encounters:  09/22/23 173 lb 6.4 oz (78.7 kg)  07/11/23 175 lb (79.4 kg)  05/25/23 171 lb (77.6 kg)     GEN:  Well nourished, well developed in no acute distress HEENT: Normal NECK: No JVD; No carotid bruits LYMPHATICS: No lymphadenopathy CARDIAC: RRR, no murmurs, rubs, gallops RESPIRATORY:  Clear to auscultation without rales, wheezing or rhonchi  ABDOMEN: Soft, non-tender, non-distended MUSCULOSKELETAL:  No edema; No deformity  SKIN: Warm and dry NEUROLOGIC:  Alert and oriented x 3 PSYCHIATRIC:  Normal affect   ASSESSMENT:    1. Syncope, unspecified syncope type   2. Other specified hypotension   3. Coronary artery disease involving native heart without angina pectoris, unspecified vessel or lesion type   4. Moderate aortic stenosis   5. History of DVT (deep vein thrombosis)   6. Hyperlipidemia, mixed    PLAN:    In order of problems listed above:  Syncopal event/fall with LOC Hypotension Patient denies further syncopal episodes. Prior heart monitor showed SR/ST, frequent PACs and occasional PVCs with short runs of SVT and Toprol was increased. BP today is low. She reports dizziness and lightheadedness. I will decrease lisinopril to 20mg  daily.   CAD S/p cypher stents in the proximal Lcx and mRCA in 06/2004. Cardiac cath in 2013 showed patent stents. She had  Lexiscan Myoview in 2019 that hsowed normal LVEF, normal WMA and mild anterior wall ischemia. Today, she denies chest pain. She has chronic breathing issues  that are unchanged. No plans for ischemic work-up at this time. She is on ASA as coumadin was stopped.   Aortic valve stenosis Echo during admission showed moderate aortic stenosis with mean gradient of 21.59mmHG and peak gradient 37.79mmHg. plan for repeat echo in 1-2 years.   H/o unprovoked DVT/clotting disorder/IVH Patient has a remote history of unprovoked DVT and was recently taken off coumadin, per patient preference.   HLD LDL 65, HDL 61, TG 185, total chol 163. Continue simvastatin.    Disposition: Follow up in 6 month(s) with MD/APP    Signed, Luverta Korte David Stall, PA-C  09/22/2023 4:28 PM    [facility]

## 2023-10-01 ENCOUNTER — Telehealth: Payer: Self-pay | Admitting: Home Health

## 2023-10-01 NOTE — Telephone Encounter (Signed)
Patient's son called after hour line reporting patient had diarrhea yesterday, feeling poor with general weakness, BP was low at 75/50. Family member was EMS. Patient was hydrated orally and did not take her BP meds yesterday or today. BP is better at 120/70. She refused going to ER yesterday. She is improved. Son is anxious about the patient not being well. Explained to the patient's son that it's normal to see elderly patient get weak and hypotensive when they are dehydrated. Advised family to continue maintain adequate hydration and food intake for the patient, hold antihypertensive lisinopril and toprolol XL for the next 48 hours. If PO intake adequate, BP stable, ok to resume. If she feels worse with weakness and diarrhea, she should go to ER for check up. Son has agreed, and states he will call the office for an appt tomorrow.

## 2023-10-02 NOTE — Telephone Encounter (Signed)
Spoke w/ pt.  She reports diarrhea and nausea since Thursday, unable to eat all weekend. BP has been running low: 114/70, 83/64. She has been feeling weak and fell in the floor yesterday, but denies any injury. She was able to eat yesterday and has been drinking Pedialyte; reports that she feels much better today. Since her BP was low, she has held her lisinopril and chlorthalidone 25 mg (this was prescribed by Dr. Daniel Nones, but is not currently on her med list) for the past 2 days.   Her BP this am was 107/61 and she feels more like her normal self.  Advised her that her BP is most likely due to dehydration from diarrhea and her to continue to monitor and add her BP meds back accordingly. Asked her to call back if readings do not improve as she feels better. Will update her med list to show the chlorthalidone, as she states that she has been taking it for over a year. Asked her to call back if we can be of further assistance.

## 2023-10-03 NOTE — Telephone Encounter (Signed)
 Pt's son made aware of recommendations and verbalized understanding. Appointment scheduled for 10/05/23 for further evaluations. Son also made aware of ED precaution should any new symptoms develop or worsen.    Furth, Cadence H, PA-C  You23 minutes ago (8:59 AM)    Thank you for the update. Agree with holding BP meds for hypotension. We can see her in the office to reevaluate BP if necessary. Also, if this happens again, would recommend ER evaluation.

## 2023-10-05 ENCOUNTER — Ambulatory Visit: Payer: Medicare HMO | Attending: Medical | Admitting: Medical

## 2023-10-05 ENCOUNTER — Encounter: Payer: Self-pay | Admitting: Medical

## 2023-10-05 VITALS — BP 126/67 | HR 88 | Ht 62.0 in | Wt 165.6 lb

## 2023-10-05 DIAGNOSIS — I615 Nontraumatic intracerebral hemorrhage, intraventricular: Secondary | ICD-10-CM

## 2023-10-05 DIAGNOSIS — I2510 Atherosclerotic heart disease of native coronary artery without angina pectoris: Secondary | ICD-10-CM

## 2023-10-05 DIAGNOSIS — E782 Mixed hyperlipidemia: Secondary | ICD-10-CM

## 2023-10-05 DIAGNOSIS — Z79899 Other long term (current) drug therapy: Secondary | ICD-10-CM

## 2023-10-05 DIAGNOSIS — J449 Chronic obstructive pulmonary disease, unspecified: Secondary | ICD-10-CM | POA: Diagnosis not present

## 2023-10-05 DIAGNOSIS — I959 Hypotension, unspecified: Secondary | ICD-10-CM

## 2023-10-05 DIAGNOSIS — I350 Nonrheumatic aortic (valve) stenosis: Secondary | ICD-10-CM

## 2023-10-05 DIAGNOSIS — I10 Essential (primary) hypertension: Secondary | ICD-10-CM | POA: Diagnosis not present

## 2023-10-05 DIAGNOSIS — Z86718 Personal history of other venous thrombosis and embolism: Secondary | ICD-10-CM | POA: Diagnosis not present

## 2023-10-05 NOTE — Patient Instructions (Signed)
 Medication Instructions:  Your physician recommends that you continue on your current medications as directed. Please refer to the Current Medication list given to you today.   *If you need a refill on your cardiac medications before your next appointment, please call your pharmacy*   Lab Work: Your provider would like for you to have following labs drawn today (CBC, BMP).     Testing/Procedures: No test ordered today    Follow-Up: At [facility], you and your health needs are our priority.  As part of our continuing mission to provide you with exceptional heart care, we have created designated Provider Care Teams.  These Care Teams include your primary Cardiologist (physician) and Advanced Practice Providers (APPs -  Physician Assistants and Nurse Practitioners) who all work together to provide you with the care you need, when you need it.  We recommend signing up for the patient portal called MyChart.  Sign up information is provided on this After Visit Summary.  MyChart is used to connect with patients for Virtual Visits (Telemedicine).  Patients are able to view lab/test results, encounter notes, upcoming appointments, etc.  Non-urgent messages can be sent to your provider as well.   To learn more about what you can do with MyChart, go to [email or web address].    Your next appointment:   6 month(s)  Provider:   Mikey Fishman, PA-C

## 2023-10-05 NOTE — Progress Notes (Signed)
 Cardiology Office Note:  .   Date:  10/05/2023  ID:  Carol Riley, DOB 1940-11-04, MRN [id number] PCP: Carol Ophelia JINNY DOUGLAS, MD  [facility] Providers Cardiologist:  None {  History of Present Illness: Carol Riley is a 83 y.o. 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   Carol Riley is a former patient of [facility], last seen 2019. She has history of cypher stents in the proximal left circumflex and mid RCA in 06/2004, repeat cardiac catheterization in 01/2012 revealed patent stents. In 2019 she returned to [facility] for increased shortness of breath. MPI in 04/2018 showed normal left ventricular function, normal wall motion and mild anterior wall ischemia. She was referred to pulmonology.    On 04/01/2023 [name] presented to the ED via EMS after a fall at home.  Patient reports that she was at her house in the kitchen and the next thing she remembers waking up on the floor.  Prior to event she was in her normal state of health.  In the ED her troponin was negative x1 at 12, magnesium  1.5.  Given periorbital swelling and anticoagulated status EMS recommended transport to [facility] but patient requested to be taken to [facility].  Initial trauma workup found acute IVH, CT maxillofacial showed comminuted and displaced depressed left orbital floor fracture.  With repeat head CT showing increase in size of bleed patient was transferred to [facility] trauma ICU. Neurosurgery recommended conservative management. On ICU telemetry indicated an episode concerning for A-fib with RVR with spontaneous resolution. Patient was evaluated by Dr. Court on 04/04/23, on review of telemetry afib was not appreciated by Dr. Court, patient was in sinus rhythm with isolated PACs. It was recommended she  wear live ZIO monitor at discharge given fall without clear cause.  Echo during admission indicated an LVEF of 60 to 65%, LV with normal function, LV with no RWMA, mild LVH, G1 DD, moderate aortic valve stenosis, aortic valve mean gradient measuring 21.7 mmHg.   Cardiac monitor indicated a minimum heart rate of 62 beats p.m., max heart rate of 226 bpm and average heart rate of 87 bpm.  Predominant underlying rhythm was sinus rhythm.  She had 56 episodes of SVT, run with the fastest interval lasting 9 beats with a max rate of 226 bpm the longest lasting 24.9 seconds with an average rate of 116 bpm.  She had frequent PACs and occasional PVCs.  Her Toprol  XL was increased to 25 mg daily.  Patient was last seen 09/22/2023 reporting dizziness and lightheadedness.  She was hypotensive and lisinopril  was decreased.  Patient called to report low blood pressures and lisinopril  was stopped. She was having diarrhea. BP increased and she was started chlorthalidone.  Today, she reports BP is better. She is not on lisinopril . She is taking chlorthalidone 25mg  daily. She is also taking toprol  25mg  daily. She feels tired. She is still coughing a lot.   Studies Reviewed: .    Echo 03/2023 1. Left ventricular ejection fraction, by estimation, is 60 to 65%. The  left ventricle has normal function. The left ventricle has no regional  wall motion abnormalities. There is mild left ventricular hypertrophy.  Left ventricular diastolic parameters  are consistent with Grade I diastolic dysfunction (impaired relaxation).   2. Right ventricular systolic function  is normal. The right ventricular  size is normal.   3. No evidence of mitral valve regurgitation.   4. The aortic valve is calcified. Aortic valve regurgitation is not  visualized. Moderate aortic valve stenosis.   5. The inferior vena cava is normal in size with greater than 50%  respiratory variability, suggesting right atrial pressure of 3 mmHg.    Cardiac  telemetry 03/2023 Patch Wear Time:  7 days and 2 hours (2024-08-08T10:56:38-0400 to 2024-08-15T13:01:56-0400)   Patient had a min HR of 62 bpm, max HR of 226 bpm, and avg HR of 87 bpm. Predominant underlying rhythm was Sinus Rhythm. 56 Supraventricular Tachycardia runs occurred, the run with the fastest interval lasting 9 beats with a max rate of 226 bpm, the  longest lasting 24.9 secs with an avg rate of 116 bpm. Isolated SVEs were occasional (2.6%, 22746), SVE Couplets were rare (<1.0%, 3005), and SVE Triplets were rare (<1.0%, 333). Isolated VEs were occasional (1.2%, 10295), VE Couplets were rare (<1.0%,  54), and no VE Triplets were present. Ventricular Bigeminy and Trigeminy were present.    SR/St Freq PACs, occasional PVCs Short runs of SVT    Physical Exam:   VS:  BP 126/67 (BP Location: Left Arm, Patient Position: Sitting, Cuff Size: Normal)   Pulse 88   Ht 5' 2 (1.575 m)   Wt 165 lb 9.6 oz (75.1 kg)   SpO2 94%   BMI 30.29 kg/m    Wt Readings from Last 3 Encounters:  10/05/23 165 lb 9.6 oz (75.1 kg)  09/22/23 173 lb 6.4 oz (78.7 kg)  07/11/23 175 lb (79.4 kg)    GEN: Well nourished, well developed in no acute distress NECK: No JVD; No carotid bruits CARDIAC: RRR, no murmurs, rubs, gallops RESPIRATORY:  +wheezing ABDOMEN: Soft, non-tender, non-distended EXTREMITIES:  No edema; No deformity   ASSESSMENT AND PLAN: .    Hypotension H/o HTN At home patient was having pressures into the 80s. so she stopped lisinopril . Blood pressures came back up and she was subsequently started on chlorthalidone 25mg  daily. Bps at home have been normal. BP today 126/67. Continue Toprol  25mg  daily and chlorthalidone 25mg  daily. Labs today with CBC and BMET.  CAD s/p cypher stents in the proximal Lcx and mRCA in 06/2004 Cardiac cath in 2013 showed patent stents. She had a myoview  lexiscan  in 2019 that showed normal LVEF, normal WMA and mild anterior wall ischemia. The patient denies anginal  symptoms. She has chronic DOE from COPD that is unchanged. No further ischemic work-up at this time. Continue ASA.    Aortic valve stenosis Echo 03/2023 showed moderate AS with mean gradient of 21.20mmHg, peak gradient 37.60mmHg. plan to repeat echo in 1-2 years.   H/o unprovoked DVT/clotting disorder/IVH Patient has a remote history of unprovoked DVT and was taken off coumadin, and would like to stay off per patient preference.   HLD LDL 65, HDL 61, TG 185, total chol 163. Continue Simvastatin .  COPD/chronic cough She has wheezing on exam and reports chronic cough. Recommended patient see pulmonology. She is on 2LO2 at baseline.  Dispo: follow-up 6 months  Signed, Blanche Gallien VEAR Fishman, PA-C

## 2023-10-06 LAB — CBC
Hematocrit: 41.4 % (ref 34.0–46.6)
Hemoglobin: 14.3 g/dL (ref 11.1–15.9)
MCH: 32.9 pg (ref 26.6–33.0)
MCHC: 34.5 g/dL (ref 31.5–35.7)
MCV: 95 fL (ref 79–97)
Platelets: 228 10*3/uL (ref 150–450)
RBC: 4.35 x10E6/uL (ref 3.77–5.28)
RDW: 11.8 % (ref 11.7–15.4)
WBC: 8.1 10*3/uL (ref 3.4–10.8)

## 2023-10-06 LAB — BASIC METABOLIC PANEL
BUN/Creatinine Ratio: 20 (ref 12–28)
BUN: 20 mg/dL (ref 8–27)
CO2: 31 mmol/L — ABNORMAL HIGH (ref 20–29)
Calcium: 10.4 mg/dL — ABNORMAL HIGH (ref 8.7–10.3)
Chloride: 91 mmol/L — ABNORMAL LOW (ref 96–106)
Creatinine, Ser: 0.98 mg/dL (ref 0.57–1.00)
Glucose: 105 mg/dL — ABNORMAL HIGH (ref 70–99)
Potassium: 4.4 mmol/L (ref 3.5–5.2)
Sodium: 138 mmol/L (ref 134–144)
eGFR: 57 mL/min/{1.73_m2} — ABNORMAL LOW (ref >59)

## 2023-10-19 DIAGNOSIS — J432 Centrilobular emphysema: Secondary | ICD-10-CM | POA: Diagnosis not present

## 2023-10-30 ENCOUNTER — Ambulatory Visit: Payer: Medicare HMO | Admitting: Internal Medicine

## 2023-10-30 ENCOUNTER — Encounter: Payer: Self-pay | Admitting: Internal Medicine

## 2023-10-30 VITALS — BP 106/60 | HR 74 | Temp 97.6°F | Ht 62.0 in | Wt 173.0 lb

## 2023-10-30 DIAGNOSIS — R0602 Shortness of breath: Secondary | ICD-10-CM

## 2023-10-30 DIAGNOSIS — J4520 Mild intermittent asthma, uncomplicated: Secondary | ICD-10-CM | POA: Diagnosis not present

## 2023-10-30 DIAGNOSIS — J984 Other disorders of lung: Secondary | ICD-10-CM

## 2023-10-30 DIAGNOSIS — J9611 Chronic respiratory failure with hypoxia: Secondary | ICD-10-CM

## 2023-10-30 MED ORDER — TRELEGY ELLIPTA 200-62.5-25 MCG/ACT IN AEPB: 1.0000 | INHALATION_SPRAY | Freq: Every day | RESPIRATORY_TRACT | Status: DC

## 2023-10-30 NOTE — Patient Instructions (Signed)
 Lets plan to start Trelegy inhaler 200 puff daily We will provide sample  Continue to use albuterol as needed  Recommend incentive spirometry which is her breathing to avoid 10-15 times per day This helps to prevent pneumonia  Follow-up with cardiology and neurology as scheduled  Avoid Allergens and Irritants Avoid secondhand smoke Avoid SICK contacts Recommend  Masking  when appropriate Recommend Keep up-to-date with vaccinations

## 2023-10-30 NOTE — Progress Notes (Unsigned)
 [facility] Pulmonary Medicine Consultation      Date: 10/30/2023,   MRN# [id number] JULISA FLIPPO 1941/05/18       CHIEF COMPLAINT:   Assessment of COPD   HISTORY OF PRESENT ILLNESS   83 year old pleasant female seen today for assessment for Shortness of breath Past medical history consists of of DVT, previously on chronic Coumadin therapy, senile purpura, depression/anxiety, peripheral neuropathy.  Unfortunately had a fall last  year with intracranial hemorrhage. She was taken off anticoagulation at that time. She has had follow-up with neurosurgery  in September, and reports that the recommendation was to continue to stay off blood thinners.   Patient diagnosed with DVT 10 years ago Was placed on Coumadin for the past 10 years Was taken off Coumadin due to her recurrent falls and intracranial hemorrhage Was advised not to restart her therapy   SPIROMETRY: FVC was 1.48 liters, 60% of predicted FEV1 was 1.12, 60% of predicted FEV1 ratio was 75.47 FEF 25-75% liters per second was 64% of predicted  LUNG VOLUMES: TLC was 66% of predicted RV was 79% of predicted  DIFFUSION CAPACITY: DLCO was 54% of predicted DLCO/VA was 73% of predicted  FLOW VOLUME LOOP: Look more restrictive   Findings reviewed with patient in detail Findings suggest more restriction than obstructive lung disease  Patient has chronic shortness of breath ever since she has had COVID in 06/2023 Seems like she has postviral cough and bronchitis Albuterol seems to help her symptoms She is not on any maintenance therapy inhalers I have suggested that we try triple therapy inhaler therapy with Trelegy Continue to use albuterol as needed  No exacerbation at this time No evidence of heart failure at this time No evidence or signs of infection at this time No respiratory distress No fevers, chills, nausea, vomiting, diarrhea No evidence of lower extremity edema No evidence hemoptysis  Patient  currently on oxygen therapy at night I recommended that she continue this on a nightly basis It does not seem she wears her oxygen every night    PAST MEDICAL HISTORY   Past Medical History:  Diagnosis Date   Arthritis    Cancer (HCC)    cervical cancer   Cataract cortical, senile    COPD (chronic obstructive pulmonary disease) (HCC)    Coronary artery disease    a. 06/2004 PCI: pLCX and RCA - Cypher DESs; b. 01/2012 Cath: patent stents->med rx; c. 04/2018 MV: mild ant ischemia   Depression    DVT (deep venous thrombosis) (HCC)    a. prev on warfarin.   Edema    FEET/LEGS   Hyperglycemia    Hypertension    Leukopenia    Moderate aortic stenosis    a. 04/2018 Echo: EF>55%, mild MR/TR/PR/AS; b. 07/2021 Echo: EF>55%, mod AS; c. 07/2022 Echo: EF>55%, mod AS, mild TR/PR; d. 03/2023 Echo: EF 60-65%, no rwma, mild LVH, GrI DD, nl RV fxn, mild MR, mod AS (mean/peak grad 21.7/37.7 mmHg).   Neuropathy    Peripheral neuropathy    PSVT (paroxysmal supraventricular tachycardia) (HCC)    a. 03/2023 Zio: 56 SVT runs, fastest 226 x 9 beats, longest 24.9 secs @ 116 bpm-->bb increased.   Senile purpura (HCC)    Sleep apnea    Thrombocytopenia (HCC)      SURGICAL HISTORY   Past Surgical History:  Procedure Laterality Date   BACK SURGERY     BREAST BIOPSY Left    benign needle   CHONDROPLASTY Left 05/13/2019   Procedure:  CHONDROPLASTY;  Surgeon: Signa Kell, MD;  Location: [facility] ORS;  Service: Orthopedics;  Laterality: Left;   COLONOSCOPY     COLONOSCOPY WITH PROPOFOL N/A 02/24/2017   Procedure: COLONOSCOPY WITH PROPOFOL;  Surgeon: Christena Deem, MD;  Location: [facility] ENDOSCOPY;  Service: Endoscopy;  Laterality: N/A;   CORONARY ANGIOPLASTY     STENT   EYE SURGERY     GAS INSERTION Right 05/06/2015   Procedure: INSERTION OF GAS;  Surgeon: Marcelene Butte, MD;  Location: [facility] ORS;  Service: Ophthalmology;  Laterality: Right;   KNEE ARTHROSCOPY WITH MEDIAL MENISECTOMY Left 05/13/2019    Procedure: KNEE ARTHROSCOPY WITH PARTIAL  MEDIAL and lateral MENISECTOMY;  Surgeon: Signa Kell, MD;  Location: [facility] ORS;  Service: Orthopedics;  Laterality: Left;   MEMBRANE PEEL Right 05/06/2015   Procedure: MEMBRANE PEEL;  Surgeon: Marcelene Butte, MD;  Location: [facility] ORS;  Service: Ophthalmology;  Laterality: Right;   PARS PLANA VITRECTOMY Right 05/06/2015   Procedure: PARS PLANA VITRECTOMY WITH 23 GAUGE;  Surgeon: Marcelene Butte, MD;  Location: [facility] OR[facility];  Service: Ophthalmology;  Laterality: Right;     FAMILY HISTORY   Family History  Problem Relation Age of Onset   Breast cancer Sister 44   Cancer Mother      SOCIAL HISTORY   Social History   Tobacco Use   Smoking status: Former   Smokeless tobacco: Never  Vaping Use   Vaping status: Never Used  Substance Use Topics   Alcohol use: No   Drug use: No     MEDICATIONS    Home Medication:  Current Outpatient Rx   Order #: [id number] Class: Normal   Order #: [id number] Class: Historical Med   Order #: [id number] Class: Normal   Order #: [id number] Class: Historical Med   Order #: [id number] Class: Historical Med   Order #: [id number] Class: Historical Med   Order #: [id number] Class: Historical Med   Order #: [id number] Class: Historical Med   Order #: [id number] Class: Historical Med   Order #: [id number] Class: Normal   Order #: [id number] Class: Normal   Order #: [id number] Class: Historical Med   Order #: [id number] Class: Normal   Order #: [id number] Class: Historical Med   Order #: [id number] Class: Normal    Current Medication:  Current Outpatient Medications:    albuterol (VENTOLIN HFA) 108 (90 Base) MCG/ACT inhaler, Inhale 2 puffs into the lungs every 4 (four) hours as needed., Disp: 18 g, Rfl: 0   aspirin EC 81 MG tablet, Take 81 mg by mouth daily. Swallow whole., Disp: , Rfl:    benzonatate (TESSALON) 100 MG capsule, Take 2 capsules (200 mg total) by mouth every 8 (eight) hours. (Patient not taking: Reported on 10/05/2023), Disp:  21 capsule, Rfl: 0   chlorthalidone (HYGROTON) 25 MG tablet, Take 25 mg by mouth daily. (Patient not taking: Reported on 10/05/2023), Disp: , Rfl:    Cholecalciferol (VITAMIN D3 PO), Take 1 tablet by mouth daily., Disp: , Rfl:    citalopram (CELEXA) 20 MG tablet, Take 20 mg by mouth daily., Disp: , Rfl:    clobetasol ointment (TEMOVATE) 0.05 %, Apply 1 Application topically as directed.  APPLY FINGERTIP SIZE AMOUNT TO AFFECTED AREA 2-3 TIMES WEEKLY FOR MAINTENANCE, Disp: , Rfl:    gabapentin (NEURONTIN) 300 MG capsule, Take 1 capsule by mouth 2 (two) times daily., Disp: , Rfl:    hydrocortisone cream 1 %, Apply 1 application topically daily as needed for itching., Disp: , Rfl:    lisinopril (ZESTRIL) 20 MG tablet, Take 1 tablet (20 mg total) by mouth  daily., Disp: 90 tablet, Rfl: 3   metoprolol succinate (TOPROL-XL) 25 MG 24 hr tablet, Take 1 tablet (25 mg total) by mouth daily., Disp: 90 tablet, Rfl: 0   Multiple Vitamin (MULTIVITAMIN) tablet, Take 1 tablet by mouth daily., Disp: , Rfl:    promethazine-dextromethorphan (PROMETHAZINE-DM) 6.25-15 MG/5ML syrup, Take 5 mLs by mouth 4 (four) times daily as needed. (Patient not taking: Reported on 10/05/2023), Disp: 118 mL, Rfl: 0   simvastatin (ZOCOR) 20 MG tablet, Take 20 mg by mouth at bedtime. , Disp: , Rfl:    Spacer/Aero-Holding Chambers (AEROCHAMBER MV) inhaler, Use as instructed, Disp: 1 each, Rfl: 2    ALLERGIES   Pneumovax [pneumococcal polysaccharide vaccine] and Tizanidine     REVIEW OF SYSTEMS   BP 106/60 (BP Location: Left Arm, Patient Position: Sitting, Cuff Size: Normal)   Pulse 74   Temp 97.6 F (36.4 C) (Temporal)   Ht 5\' 2"  (1.575 m)   Wt 173 lb (78.5 kg)   SpO2 94%   BMI 31.64 kg/m    Review of Systems:  Gen:  Denies  fever, sweats, chills weigh loss  HEENT: Denies blurred vision, double vision, ear pain, eye pain, hearing loss, nose bleeds, sore throat Cardiac:  No dizziness, chest pain or heaviness, chest  tightness,edema Resp:   + cough +sputum porduction, +shortness of breath,+wheezing, hemoptysis,  Gi: Denies swallowing difficulty, stomach pain, nausea or vomiting, diarrhea, constipation, bowel incontinence Gu:  Denies bladder incontinence, burning urine Ext:   Denies Joint pain, stiffness or swelling Skin: +skin rash, easy bruising  Endoc:  Denies polyuria, polydipsia , polyphagia or weight change Psych:   Denies depression, insomnia or hallucinations   Other:  All other systems negative  BP 106/60 (BP Location: Left Arm, Patient Position: Sitting, Cuff Size: Normal)   Pulse 74   Temp 97.6 F (36.4 C) (Temporal)   Ht 5\' 2"  (1.575 m)   Wt 173 lb (78.5 kg)   SpO2 94%   BMI 31.64 kg/m     PHYSICAL EXAM  General Appearance: No distress  EYES PERRLA, EOM intact.   NECK Supple, No JVD Pulmonary: normal breath sounds, No wheezing.  CardiovascularNormal S1,S2.  No m/r/g.   Abdomen: Benign, Soft, non-tender. Skin:   warm, no rashes, no ecchymosis  Extremities: normal, no cyanosis, clubbing. Neuro:without focal findings,  speech normal  PSYCHIATRIC: Mood, affect within normal limits.   ALL OTHER ROS ARE NEGATIVE        ASSESSMENT/PLAN   83 year old pleasant white female seen today for ongoing shortness of breath and dyspnea on exertion likely related to several factors including post COVID infection with bronchitis and productive cough related to underlying deconditioned state with obesity with a history of restrictive lung disease intermittent reactive airways disease, in the setting of chronic hypoxic respiratory failure on oxygen therapy.  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 All questions answered  Follow up  4 weeks  I spent a total of 62 minutes reviewing chart data, face-to-face evaluation with the patient, counseling and coordination of care as detailed above.     Lucie Leather, M.D.  [facility]  Medical Director [facility] Medical Director [facility] Cardio-Pulmonary Department

## 2023-11-16 DIAGNOSIS — J432 Centrilobular emphysema: Secondary | ICD-10-CM | POA: Diagnosis not present

## 2023-11-27 ENCOUNTER — Other Ambulatory Visit: Payer: Self-pay | Admitting: Internal Medicine

## 2023-11-28 ENCOUNTER — Other Ambulatory Visit: Payer: Self-pay

## 2023-11-28 MED ORDER — TRELEGY ELLIPTA 200-62.5-25 MCG/ACT IN AEPB
1.0000 | INHALATION_SPRAY | Freq: Every day | RESPIRATORY_TRACT | 11 refills | Status: AC
  Filled 2023-11-28: qty 60, 30d supply, fill #0

## 2023-11-29 ENCOUNTER — Other Ambulatory Visit: Payer: Self-pay

## 2023-11-30 ENCOUNTER — Encounter: Payer: Self-pay | Admitting: Internal Medicine

## 2023-11-30 ENCOUNTER — Ambulatory Visit: Admitting: Internal Medicine

## 2023-11-30 VITALS — BP 118/62 | HR 78 | Ht 62.0 in | Wt 176.0 lb

## 2023-11-30 DIAGNOSIS — J9611 Chronic respiratory failure with hypoxia: Secondary | ICD-10-CM | POA: Diagnosis not present

## 2023-11-30 DIAGNOSIS — J411 Mucopurulent chronic bronchitis: Secondary | ICD-10-CM | POA: Diagnosis not present

## 2023-11-30 MED ORDER — TRELEGY ELLIPTA 200-62.5-25 MCG/ACT IN AEPB: 1.0000 | INHALATION_SPRAY | Freq: Every day | RESPIRATORY_TRACT | Status: DC

## 2023-11-30 NOTE — Patient Instructions (Addendum)
 Please continue Trelegy inhaler as prescribed Rinse mouth after every use Continue oxygen as prescribed  Please call us when you find that which inhalers are covered under your insurance  Avoid Allergens and Irritants Avoid secondhand smoke Avoid SICK contacts Recommend  Masking  when appropriate Recommend Keep up-to-date with vaccinations

## 2023-11-30 NOTE — Addendum Note (Signed)
 Addended by: Judd Gaudier on: 11/30/2023 04:13 PM   Modules accepted: Orders

## 2023-11-30 NOTE — Progress Notes (Signed)
 [facility] Pulmonary Medicine Consultation      Date: 11/30/2023,   MRN# [id number] Carol Riley Oct 04, 1940  Synopsis 83 year old pleasant female seen today for assessment for Shortness of breath Past medical history consists of of DVT, previously on chronic Coumadin therapy, senile purpura, depression/anxiety, peripheral neuropathy.  Unfortunately had a fall last  year with intracranial hemorrhage. She was taken off anticoagulation at that time. She has had follow-up with neurosurgery, and reports that the recommendation was to continue to stay off blood thinners.  Patient diagnosed with DVT 10 years ago Was placed on Coumadin for the past 10 years Was taken off Coumadin due to her recurrent falls and intracranial hemorrhage,Was advised not to restart her therapy   TESTS SPIROMETRY: FVC was 1.48 liters, 60% of predicted FEV1 was 1.12, 60% of predicted FEV1 ratio was 75.47 FEF 25-75% liters per second was 64% of predicted  LUNG VOLUMES: TLC was 66% of predicted RV was 79% of predicted  DIFFUSION CAPACITY: DLCO was 54% of predicted DLCO/VA was 73% of predicted  FLOW VOLUME LOOP: Look more restrictive   Findings reviewed with patient in detail Findings suggest more restriction than obstructive lung disease   CHIEF COMPLAINT:   Follow-up assessment for COPD-chronic bronchitis   HISTORY OF PRESENT ILLNESS   Chronic shortness of breath since November 2024 COVID infection Postviral bronchitis and cough Albuterol responsive Started on Trelegy inhaler last office visit Her symptoms have resolved with Trelegy inhaler However they are very possible I have advised her to follow-up with pharmacy which medication she can afford to call us so we can titrate accordingly  No exacerbation at this time No evidence of heart failure at this time No evidence or signs of infection at this time No respiratory distress No fevers, chills, nausea, vomiting, diarrhea No evidence of  lower extremity edema No evidence hemoptysis  Patient currently on oxygen therapy at night I recommended that she continue this on a nightly basis It does not seem she wears her oxygen every night    PAST MEDICAL HISTORY   Past Medical History:  Diagnosis Date   Arthritis    Cancer (HCC)    cervical cancer   Cataract cortical, senile    COPD (chronic obstructive pulmonary disease) (HCC)    Coronary artery disease    a. 06/2004 PCI: pLCX and RCA - Cypher DESs; b. 01/2012 Cath: patent stents->med rx; c. 04/2018 MV: mild ant ischemia   Depression    DVT (deep venous thrombosis) (HCC)    a. prev on warfarin.   Edema    FEET/LEGS   Hyperglycemia    Hypertension    Leukopenia    Moderate aortic stenosis    a. 04/2018 Echo: EF>55%, mild MR/TR/PR/AS; b. 07/2021 Echo: EF>55%, mod AS; c. 07/2022 Echo: EF>55%, mod AS, mild TR/PR; d. 03/2023 Echo: EF 60-65%, no rwma, mild LVH, GrI DD, nl RV fxn, mild MR, mod AS (mean/peak grad 21.7/37.7 mmHg).   Neuropathy    Peripheral neuropathy    PSVT (paroxysmal supraventricular tachycardia) (HCC)    a. 03/2023 Zio: 56 SVT runs, fastest 226 x 9 beats, longest 24.9 secs @ 116 bpm-->bb increased.   Senile purpura (HCC)    Sleep apnea    Thrombocytopenia (HCC)      SURGICAL HISTORY   Past Surgical History:  Procedure Laterality Date   BACK SURGERY     BREAST BIOPSY Left    benign needle   CHONDROPLASTY Left 05/13/2019   Procedure: CHONDROPLASTY;  Surgeon: Allena Katz,  Learta Codding, MD;  Location: [facility] ORS;  Service: Orthopedics;  Laterality: Left;   COLONOSCOPY     COLONOSCOPY WITH PROPOFOL N/A 02/24/2017   Procedure: COLONOSCOPY WITH PROPOFOL;  Surgeon: Christena Deem, MD;  Location[facility] ENDOSCOPY;  Service: Endoscopy;  Laterality: N/A;   CORONARY ANGIOPLASTY     STENT   EYE SURGERY     GAS INSERTION Right 05/06/2015   Procedure: INSERTION OF GAS;  Surgeon: Marcelene Butte, MD;  Location: [facility] ORS;  Service: Ophthalmology;  Laterality: Right;   KNEE  ARTHROSCOPY WITH MEDIAL MENISECTOMY Left 05/13/2019   Procedure: KNEE ARTHROSCOPY WITH PARTIAL  MEDIAL and lateral MENISECTOMY;  Surgeon: Signa Kell, MD;  Location: [facility] ORS;  Service: Orthopedics;  Laterality: Left;   MEMBRANE PEEL Right 05/06/2015   Procedure: MEMBRANE PEEL;  Surgeon: Marcelene Butte, MD;  Location: [facility] ORS;  Service: Ophthalmology;  Laterality: Right;   PARS PLANA VITRECTOMY Right 05/06/2015   Procedure: PARS PLANA VITRECTOMY WITH 23 GAUGE;  Surgeon: Marcelene Butte, MD;  Location: [facility] ORS;  Service: Ophthalmology;  Laterality: Right;     FAMILY HISTORY   Family History  Problem Relation Age of Onset   Breast cancer Sister 62   Cancer Mother      SOCIAL HISTORY   Social History   Tobacco Use   Smoking status: Former    Current packs/day: 0.00    Types: Cigarettes    Quit date: 08/29/1998    Years since quitting: 25.2   Smokeless tobacco: Never   Tobacco comments:    Started smoking at age 67, smoked 1 pack a day.  quit in 2000.   Vaping Use   Vaping status: Never Used  Substance Use Topics   Alcohol use: No   Drug use: No     MEDICATIONS    Home Medication:  Current Outpatient Rx   Order #: [id number] Class: Normal   Order #: [id number] Class: Historical Med   Order #: [id number] Class: Normal   Order #: [id number] Class: Historical Med   Order #: [id number] Class: Historical Med   Order #: [id number] Class: Historical Med   Order #: [id number] Class: Historical Med   Order #: [id number] Class: Normal   Order #: [id number] Class: Historical Med   Order #: [id number] Class: Historical Med   Order #: [id number] Class: Normal   Order #: [id number] Class: Normal   Order #: [id number] Class: Historical Med   Order #: [id number] Class: Historical Med   Order #: [id number] Class: Normal   Order #: [id number] Class: Historical Med   Order #: [id number] Class: Normal    Current Medication:  Current Outpatient Medications:    albuterol (VENTOLIN HFA) 108 (90 Base) MCG/ACT  inhaler, Inhale 2 puffs into the lungs every 4 (four) hours as needed., Disp: 18 g, Rfl: 0   aspirin EC 81 MG tablet, Take 81 mg by mouth daily. Swallow whole., Disp: , Rfl:    benzonatate (TESSALON) 100 MG capsule, Take 2 capsules (200 mg total) by mouth every 8 (eight) hours., Disp: 21 capsule, Rfl: 0   chlorthalidone (HYGROTON) 25 MG tablet, Take 25 mg by mouth daily., Disp: , Rfl:    Cholecalciferol (VITAMIN D3 PO), Take 1 tablet by mouth daily., Disp: , Rfl:    citalopram (CELEXA) 20 MG tablet, Take 20 mg by mouth daily., Disp: , Rfl:    clobetasol ointment (TEMOVATE) 0.05 %, Apply 1 Application topically as directed.  APPLY FINGERTIP SIZE AMOUNT TO AFFECTED AREA 2-3 TIMES WEEKLY FOR MAINTENANCE, Disp: , Rfl:    Fluticasone-Umeclidin-Vilant (TRELEGY ELLIPTA) 200-62.5-25 MCG/ACT AEPB, Inhale 1 puff  into the lungs daily in the afternoon., Disp: 60 each, Rfl: 11   gabapentin (NEURONTIN) 300 MG capsule, Take 1 capsule by mouth 2 (two) times daily., Disp: , Rfl:    hydrocortisone cream 1 %, Apply 1 application topically daily as needed for itching., Disp: , Rfl:    lisinopril (ZESTRIL) 20 MG tablet, Take 1 tablet (20 mg total) by mouth daily. (Patient not taking: Reported on 10/30/2023), Disp: 90 tablet, Rfl: 3   metoprolol succinate (TOPROL-XL) 25 MG 24 hr tablet, Take 1 tablet (25 mg total) by mouth daily., Disp: 90 tablet, Rfl: 0   Multiple Vitamin (MULTIVITAMIN) tablet, Take 1 tablet by mouth daily., Disp: , Rfl:    OXYGEN, Inhale 2 L into the lungs at bedtime., Disp: , Rfl:    promethazine-dextromethorphan (PROMETHAZINE-DM) 6.25-15 MG/5ML syrup, Take 5 mLs by mouth 4 (four) times daily as needed. (Patient not taking: Reported on 09/22/2023), Disp: 118 mL, Rfl: 0   simvastatin (ZOCOR) 20 MG tablet, Take 20 mg by mouth at bedtime. , Disp: , Rfl:    Spacer/Aero-Holding Chambers (AEROCHAMBER MV) inhaler, Use as instructed, Disp: 1 each, Rfl: 2    ALLERGIES   Pneumovax [pneumococcal polysaccharide  vaccine] and Tizanidine   BP 118/62   Pulse 78   Ht 5\' 2"  (1.575 m)   Wt 176 lb (79.8 kg)   SpO2 95% Comment: wears oxygen at night  BMI 32.19 kg/m       Review of Systems: Gen:  Denies  fever, sweats, chills weight loss  HEENT: Denies blurred vision, double vision, ear pain, eye pain, hearing loss, nose bleeds, sore throat Cardiac:  No dizziness, chest pain or heaviness, chest tightness,edema, No JVD Resp:   No cough, -sputum production, -shortness of breath,-wheezing, -hemoptysis,  Other:  All other systems negative   Physical Examination:   General Appearance: No distress  EYES PERRLA, EOM intact.   NECK Supple, No JVD Pulmonary: normal breath sounds, No wheezing.  CardiovascularNormal S1,S2.  No m/r/g.   Abdomen: Benign, Soft, non-tender. Neurology UE/LE 5/5 strength, no focal deficits Ext pulses intact, cap refill intact ALL OTHER ROS ARE NEGATIVE    ASSESSMENT/PLAN   83 year old pleasant white female seen today for follow-up assessment for shortness of breath dyspnea on exertion likely related to chronic bronchitis post COVID infection with underlying deconditioned state and obesity with a history of restrictive lung disease intermittent reactive airways disease in the setting of chronic hypoxic respiratory failure on oxygen therapy  Chronic bronchitis Continue Trelegy 200 1 puff once a day Please rinse mouth after every use use incentive spirometry 10-15 times per day This will assist with prevention of pneumonia and increased respiratory muscle strength Avoid Allergens and Irritants Avoid secondhand smoke Avoid SICK contacts Recommend  Masking  when appropriate Recommend Keep up-to-date with vaccinations  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 -Patient benefits from oxygen therapy 2L [place]  -recommend using oxygen as prescribed -patient needs this for survival   Follow-up cardiology follow-up neurology as scheduled     MEDICATION ADJUSTMENTS/LABS  AND TESTS ORDERED: Continue Trelegy inhaler 200 puff daily Sample provided Please inform us which inhalers are covered by insurance and how much you can afford Most continue to use albuterol as needed Recommend incentive spirometry which is her breathing to avoid 10-15 times per day-This helps to prevent pneumonia   CURRENT MEDICATIONS REVIEWED AT LENGTH WITH PATIENT TODAY   Patient  satisfied with Plan of action and management. All questions answered  Follow up  6 months  I spent a total of 42 minutes reviewing chart data, face-to-face evaluation with the patient, counseling and coordination of care as detailed above.     Lucie Leather, M.D.  [facility]  Medical Director [facility] Medical Director [facility] Cardio-Pulmonary Department

## 2023-12-01 DIAGNOSIS — L239 Allergic contact dermatitis, unspecified cause: Secondary | ICD-10-CM | POA: Diagnosis not present

## 2023-12-17 DIAGNOSIS — J432 Centrilobular emphysema: Secondary | ICD-10-CM | POA: Diagnosis not present

## 2024-01-05 ENCOUNTER — Emergency Department (HOSPITAL_COMMUNITY)
Admission: EM | Admit: 2024-01-05 | Discharge: 2024-01-05 | Disposition: A | Discharge status: Home / Self Care | Attending: Emergency Medicine | Admitting: Emergency Medicine

## 2024-01-05 ENCOUNTER — Telehealth

## 2024-01-05 ENCOUNTER — Encounter (HOSPITAL_COMMUNITY): Payer: Self-pay | Admitting: *Deleted

## 2024-01-05 ENCOUNTER — Emergency Department (HOSPITAL_COMMUNITY)

## 2024-01-05 ENCOUNTER — Other Ambulatory Visit: Payer: Self-pay

## 2024-01-05 DIAGNOSIS — R0602 Shortness of breath: Secondary | ICD-10-CM | POA: Diagnosis not present

## 2024-01-05 DIAGNOSIS — Z955 Presence of coronary angioplasty implant and graft: Secondary | ICD-10-CM | POA: Insufficient documentation

## 2024-01-05 DIAGNOSIS — Z79899 Other long term (current) drug therapy: Secondary | ICD-10-CM | POA: Insufficient documentation

## 2024-01-05 DIAGNOSIS — I10 Essential (primary) hypertension: Secondary | ICD-10-CM | POA: Insufficient documentation

## 2024-01-05 DIAGNOSIS — J449 Chronic obstructive pulmonary disease, unspecified: Secondary | ICD-10-CM | POA: Insufficient documentation

## 2024-01-05 DIAGNOSIS — I2510 Atherosclerotic heart disease of native coronary artery without angina pectoris: Secondary | ICD-10-CM | POA: Insufficient documentation

## 2024-01-05 DIAGNOSIS — R600 Localized edema: Secondary | ICD-10-CM | POA: Diagnosis not present

## 2024-01-05 DIAGNOSIS — R531 Weakness: Secondary | ICD-10-CM | POA: Diagnosis not present

## 2024-01-05 DIAGNOSIS — Z7982 Long term (current) use of aspirin: Secondary | ICD-10-CM | POA: Diagnosis not present

## 2024-01-05 DIAGNOSIS — I700 Atherosclerosis of aorta: Secondary | ICD-10-CM | POA: Diagnosis not present

## 2024-01-05 DIAGNOSIS — R7989 Other specified abnormal findings of blood chemistry: Secondary | ICD-10-CM | POA: Insufficient documentation

## 2024-01-05 DIAGNOSIS — R2243 Localized swelling, mass and lump, lower limb, bilateral: Secondary | ICD-10-CM | POA: Diagnosis present

## 2024-01-05 LAB — CBC
HCT: 44.8 % (ref 36.0–46.0)
Hemoglobin: 15.1 g/dL — ABNORMAL HIGH (ref 12.0–15.0)
MCH: 31.9 pg (ref 26.0–34.0)
MCHC: 33.7 g/dL (ref 30.0–36.0)
MCV: 94.7 fL (ref 80.0–100.0)
Platelets: 165 10*3/uL (ref 150–400)
RBC: 4.73 MIL/uL (ref 3.87–5.11)
RDW: 13.5 % (ref 11.5–15.5)
WBC: 5.8 10*3/uL (ref 4.0–10.5)
nRBC: 0 % (ref 0.0–0.2)

## 2024-01-05 LAB — BASIC METABOLIC PANEL WITH GFR
Anion gap: 10 (ref 5–15)
BUN: 19 mg/dL (ref 8–23)
CO2: 30 mmol/L (ref 22–32)
Calcium: 10.3 mg/dL (ref 8.9–10.3)
Chloride: 99 mmol/L (ref 98–111)
Creatinine, Ser: 1.12 mg/dL — ABNORMAL HIGH (ref 0.44–1.00)
GFR, Estimated: 48 mL/min — ABNORMAL LOW (ref >60)
Glucose, Bld: 104 mg/dL — ABNORMAL HIGH (ref 70–99)
Potassium: 3.8 mmol/L (ref 3.5–5.1)
Sodium: 139 mmol/L (ref 135–145)

## 2024-01-05 LAB — TROPONIN I (HIGH SENSITIVITY)
Troponin I (High Sensitivity): 12 ng/L (ref <18)
Troponin I (High Sensitivity): 11 ng/L (ref <18)

## 2024-01-05 LAB — BRAIN NATRIURETIC PEPTIDE: B Natriuretic Peptide: 181.4 pg/mL — ABNORMAL HIGH (ref 0.0–100.0)

## 2024-01-05 MED ORDER — ENOXAPARIN SODIUM 120 MG/0.8ML IJ SOSY
1.5000 mg/kg | PREFILLED_SYRINGE | Freq: Once | INTRAMUSCULAR | Status: AC
  Administered 2024-01-05: 120 mg via SUBCUTANEOUS
  Filled 2024-01-05: qty 0.8

## 2024-01-05 MED ORDER — ALBUTEROL SULFATE (2.5 MG/3ML) 0.083% IN NEBU
5.0000 mg | INHALATION_SOLUTION | Freq: Once | RESPIRATORY_TRACT | Status: AC
  Administered 2024-01-05: 5 mg via RESPIRATORY_TRACT
  Filled 2024-01-05: qty 6

## 2024-01-05 MED ORDER — FUROSEMIDE 20 MG PO TABS: 20.0000 mg | ORAL_TABLET | Freq: Every day | ORAL | 0 refills | Status: DC

## 2024-01-05 MED ORDER — IPRATROPIUM BROMIDE 0.02 % IN SOLN
0.5000 mg | Freq: Once | RESPIRATORY_TRACT | Status: AC
  Administered 2024-01-05: 0.5 mg via RESPIRATORY_TRACT
  Filled 2024-01-05: qty 2.5

## 2024-01-05 NOTE — ED Provider Notes (Incomplete)
 [place] EMERGENCY DEPARTMENT AT [facility] Provider Note   CSN: [id number] Arrival date & time: 01/05/24  1633     History {Add pertinent medical, surgical, social history, OB history to HPI:1} Chief Complaint  Patient presents with   Leg Swelling    Carol Riley is a 83 y.o. female.  Patient is an 83 year old female with a history of COPD on 2 L of oxygen chronically, CAD status post stents, hypertension, DVT who had been on Coumadin for years but due to a fall and intracranial hemorrhage last October she was discontinued on anticoagulation, aortic stenosis and paroxysmal SVT who is presenting today with worsening swelling in her lower extremities over the last 1 week and extreme fatigue.  She reports that the swelling seems to be the same all the time.  It is not better in the morning when she wakes up.  She is short of breath all the time but does not particularly feel like it is any worse.  She has not had to increase her oxygen and has continued to use her inhalers.  She denies any orthopnea but always sleeps upright in a recliner.  She reports her legs feel tight but denies significant pain.  She is concerned that she has recurrent blood clots because she has been off the Coumadin but denies any recent trauma or admissions or immobility.  She has not had fever, productive cough, abdominal distention.  She has been eating and drinking normally.  She denies any chest pain.  She is compliant with her medications and does not use diuretics.  Her last echo was in August 2024 which showed an EF of 60 to 65%.  The history is provided by the patient, medical records and a relative.       Home Medications Prior to Admission medications   Medication Sig Start Date End Date Taking? Authorizing Provider  albuterol  (VENTOLIN  HFA) 108 (90 Base) MCG/ACT inhaler Inhale 2 puffs into the lungs every 4 (four) hours as needed. 08/30/23   Kent Pear, NP  aspirin EC 81 MG tablet Take 81  mg by mouth daily. Swallow whole.    [provider]  benzonatate  (TESSALON ) 100 MG capsule Take 2 capsules (200 mg total) by mouth every 8 (eight) hours. 08/30/23   Kent Pear, NP  chlorthalidone (HYGROTON) 25 MG tablet Take 25 mg by mouth daily.    Melchor Spoon, MD  Cholecalciferol (VITAMIN D3 PO) Take 1 tablet by mouth daily.    [provider]  citalopram (CELEXA) 20 MG tablet Take 20 mg by mouth daily.    [provider]  clobetasol ointment (TEMOVATE) 0.05 % Apply 1 Application topically as directed.  APPLY FINGERTIP SIZE AMOUNT TO AFFECTED AREA 2-3 TIMES WEEKLY FOR MAINTENANCE    [provider]  Fluticasone -Umeclidin-Vilant (TRELEGY ELLIPTA ) 200-62.5-25 MCG/ACT AEPB Inhale 1 puff into the lungs daily in the afternoon. 11/28/23   Kasa, Kurian, MD  Fluticasone -Umeclidin-Vilant (TRELEGY ELLIPTA ) 200-62.5-25 MCG/ACT AEPB Inhale 1 puff into the lungs daily at 6 (six) AM. 11/30/23   Kasa, Kurian, MD  gabapentin (NEURONTIN) 300 MG capsule Take 1 capsule by mouth 2 (two) times daily. 07/12/23 07/11/24  [provider]  hydrocortisone  cream 1 % Apply 1 application topically daily as needed for itching.    [provider]  metoprolol  succinate (TOPROL -XL) 25 MG 24 hr tablet Take 1 tablet (25 mg total) by mouth daily. 04/27/23   Sanjuanita Cruz, NP  Multiple Vitamin (MULTIVITAMIN) tablet  Take 1 tablet by mouth daily.    [provider]  OXYGEN Inhale 2 L into the lungs at bedtime.    [provider]  simvastatin  (ZOCOR ) 20 MG tablet Take 20 mg by mouth at bedtime.     [provider]  Spacer/Aero-Holding Chambers (AEROCHAMBER MV) inhaler Use as instructed 08/30/23   Kent Pear, NP      Allergies    Pneumovax [pneumococcal polysaccharide vaccine] and Tizanidine    Review of Systems   Review of Systems  Physical Exam Updated Vital Signs BP (!) 166/71   Pulse 77   Temp 97.7 F (36.5 C)   Resp 18   Ht 5\' 2"   (1.575 m)   Wt 79.8 kg   SpO2 97%   BMI 32.18 kg/m  Physical Exam Vitals and nursing note reviewed.  Constitutional:      General: She is not in acute distress.    Appearance: She is well-developed.  HENT:     Head: Normocephalic and atraumatic.  Eyes:     Conjunctiva/sclera: Conjunctivae normal.     Pupils: Pupils are equal, round, and reactive to light.  Cardiovascular:     Rate and Rhythm: Normal rate and regular rhythm.     Heart sounds: No murmur heard. Pulmonary:     Effort: Pulmonary effort is normal. No respiratory distress.     Breath sounds: Wheezing and rales present.  Abdominal:     General: There is no distension.     Palpations: Abdomen is soft.     Tenderness: There is no abdominal tenderness. There is no guarding or rebound.  Musculoskeletal:        General: No tenderness. Normal range of motion.     Cervical back: Normal range of motion and neck supple.     Right lower leg: Edema present.     Left lower leg: Edema present.     Comments: 2+ pitting edema bilateral lower extremities below the knee  Skin:    General: Skin is warm and dry.     Findings: No erythema or rash.  Neurological:     Mental Status: She is alert and oriented to person, place, and time. Mental status is at baseline.  Psychiatric:        Behavior: Behavior normal.     ED Results / Procedures / Treatments   Labs (all labs ordered are listed, but only abnormal results are displayed) Labs Reviewed  BASIC METABOLIC PANEL WITH GFR - Abnormal; Notable for the following components:      Result Value   Glucose, Bld 104 (*)    Creatinine, Ser 1.12 (*)    GFR, Estimated 48 (*)    All other components within normal limits  CBC - Abnormal; Notable for the following components:   Hemoglobin 15.1 (*)    All other components within normal limits  BRAIN NATRIURETIC PEPTIDE  TROPONIN I (HIGH SENSITIVITY)  TROPONIN I (HIGH SENSITIVITY)    EKG EKG Interpretation Date/Time:  Friday Jan 05 2024 17:08:21 EDT Ventricular Rate:  75 PR Interval:  174 QRS Duration:  74 QT Interval:  430 QTC Calculation: 480 R Axis:   75  Text Interpretation: Normal sinus rhythm Normal ECG When compared with ECG of 22-Sep-2023 15:12, PREVIOUS ECG IS PRESENT Confirmed by [employer] ([id number]) on 01/05/2024 6:27:09 PM  Radiology DG Chest 2 View Result Date: 01/05/2024 CLINICAL DATA:  Shortness of breath and weakness EXAM: CHEST - 2 VIEW COMPARISON:  None Available. FINDINGS: Stable  cardiopericardial silhouette. Calcified aorta. No pneumothorax, effusion or consolidation. Prominence of central vasculature. Degenerative changes of the spine. IMPRESSION: Prominent central vasculature. Electronically Signed   By: Adrianna Horde M.D.   On: 01/05/2024 18:25    Procedures Procedures  {Document cardiac monitor, telemetry assessment procedure when appropriate:1}  Medications Ordered in ED Medications  albuterol  (PROVENTIL ) (2.5 MG/3ML) 0.083% nebulizer solution 5 mg (has no administration in time range)  ipratropium (ATROVENT) nebulizer solution 0.5 mg (has no administration in time range)    ED Course/ Medical Decision Making/ A&P   {   Click here for ABCD2, HEART and other calculatorsREFRESH Note before signing :1}                              Medical Decision Making Amount and/or Complexity of Data Reviewed Labs: ordered. Radiology: ordered.  Risk Prescription drug management.   Pt with multiple medical problems and comorbidities and presenting today with a complaint that caries a high risk for morbidity and mortality.  Here today with above complaints.  Concern for CHF, renal insufficiency, DVT.  Patient denies increased shortness of breath or chest complaints but is wheezing on exam.  Bilateral lower extremities are swollen.  Vital signs are reassuring.  I independently interpreted patient's labs and EKG.  BMP without acute findings with normal electrolytes and creatinine at baseline at 1.12,  CBC without acute findings, troponin is normal at 12 and BNP mildly elevated at 181.  I have independently visualized and interpreted pt's images today.  Chest x-ray without acute findings but radiology reports prominent central vasculature.  Unfortunately vascular had already gone home for the day when patient arrived.    {Document critical care time when appropriate:1} {Document review of labs and clinical decision tools ie heart score, Chads2Vasc2 etc:1}  {Document your independent review of radiology images, and any outside records:1} {Document your discussion with family members, caretakers, and with consultants:1} {Document social determinants of health affecting pt's care:1} {Document your decision making why or why not admission, treatments were needed:1} Final Clinical Impression(s) / ED Diagnoses Final diagnoses:  None    Rx / DC Orders ED Discharge Orders     None

## 2024-01-05 NOTE — ED Notes (Signed)
 The pt  has been notified of acuity 2

## 2024-01-05 NOTE — ED Triage Notes (Signed)
 The pt is c/o sob  and weakness

## 2024-01-05 NOTE — ED Notes (Signed)
Pt care taken, no complaints at this time. 

## 2024-01-05 NOTE — ED Triage Notes (Signed)
 The pt is c/o some bi-lateral legs swollen for 3-4 days  she has an appointment with her doctor soon  she also has a rash that she saw a dermatologist for the rash  no any better

## 2024-01-05 NOTE — Discharge Instructions (Addendum)
 If tomorrow is a Saturday, Sunday or holiday, please go to the [facility] Emergency Department Registration Desk at 11 am tomorrow morning and tell them you are there for a vascular study.   You received a blood thinner that will cover you for 24 hours until we can confirm there is no blood clot.  Also you were given a fluid pill to take for the next 5 days to see if that helps with the swelling.  If you develop chest pain or worsening shortness of breath over the weekend return to the emergency room.

## 2024-01-05 NOTE — ED Provider Triage Note (Signed)
 Emergency Medicine Provider Triage Evaluation Note  Carol Riley , a 83 y.o. female  was evaluated in triage.  Pt complains of bilateral lower extremity swelling worse on the left.  History of DVT.  Denies chest pain..  Review of Systems  Positive: As above Negative: As above  Physical Exam  BP (!) 170/75 (BP Location: Left Arm)   Pulse 84   Temp 97.7 F (36.5 C)   Resp 16   Ht 5\' 2"  (1.575 m)   Wt 79.8 kg   SpO2 94%   BMI 32.18 kg/m  Gen:   Awake, no distress   Resp:  Normal effort  MSK:   Moves extremities without difficulty  Other:    Medical Decision Making  Medically screening exam initiated at 5:38 PM.  Appropriate orders placed.  Carol Riley was informed that the remainder of the evaluation will be completed by another provider, this initial triage assessment does not replace that evaluation, and the importance of remaining in the ED until their evaluation is complete.     Lucina Sabal, PA-C 01/05/24 1739

## 2024-01-05 NOTE — Progress Notes (Signed)
 PHARMACY - ANTICOAGULATION CONSULT NOTE  Pharmacy Consult for enoxaparin   Indication: potential DVT   Allergies  Allergen Reactions   Pneumovax [Pneumococcal Polysaccharide Vaccine] Swelling    Redness   Tizanidine Other (See Comments)    Caused low blood pressure    Patient Measurements: Height: 5\' 2"  (157.5 cm) Weight: 79.8 kg (175 lb 14.8 oz) IBW/kg (Calculated) : 50.1 HEPARIN DW (KG): 67.8  Vital Signs: Temp: 97.7 F (36.5 C) (05/09 1649) BP: 166/71 (05/09 1835) Pulse Rate: 77 (05/09 1835)  Labs: Recent Labs    01/05/24 1741  HGB 15.1*  HCT 44.8  PLT 165  CREATININE 1.12*  TROPONINIHS 12    Estimated Creatinine Clearance: 36.6 mL/min (A) (by C-G formula based on SCr of 1.12 mg/dL (H)).   Medical History: Past Medical History:  Diagnosis Date   Arthritis    Cancer (HCC)    cervical cancer   Cataract cortical, senile    COPD (chronic obstructive pulmonary disease) (HCC)    Coronary artery disease    a. 06/2004 PCI: pLCX and RCA - Cypher DESs; b. 01/2012 Cath: patent stents->med rx; c. 04/2018 MV: mild ant ischemia   Depression    DVT (deep venous thrombosis) (HCC)    a. prev on warfarin.   Edema    FEET/LEGS   Hyperglycemia    Hypertension    Leukopenia    Moderate aortic stenosis    a. 04/2018 Echo: EF>55%, mild MR/TR/PR/AS; b. 07/2021 Echo: EF>55%, mod AS; c. 07/2022 Echo: EF>55%, mod AS, mild TR/PR; d. 03/2023 Echo: EF 60-65%, no rwma, mild LVH, GrI DD, nl RV fxn, mild MR, mod AS (mean/peak grad 21.7/37.7 mmHg).   Neuropathy    Peripheral neuropathy    PSVT (paroxysmal supraventricular tachycardia) (HCC)    a. 03/2023 Zio: 56 SVT runs, fastest 226 x 9 beats, longest 24.9 secs @ 116 bpm-->bb increased.   Senile purpura (HCC)    Sleep apnea    Thrombocytopenia (HCC)     Assessment: DVT rule out, but ultrasound not available. HgB 15.1 and PLTs 165. Renal fxn stable.   Goal of Therapy:  Monitor platelets by anticoagulation protocol: Yes   Plan:   Enoxaparin  1.5mg /kg (120mg ) x1. Will last for approximately 24 hours with renal fxn.  F/u ability to get DVT study to determine need for additional anticoagulation.   Mamie Searles, PharmD, BCCCP  01/05/2024,8:27 PM

## 2024-01-06 ENCOUNTER — Ambulatory Visit (HOSPITAL_COMMUNITY)
Admission: RE | Admit: 2024-01-06 | Discharge: 2024-01-06 | Disposition: A | Discharge status: Home / Self Care | Source: Ambulatory Visit | Attending: Emergency Medicine | Admitting: Emergency Medicine

## 2024-01-06 ENCOUNTER — Ambulatory Visit (HOSPITAL_COMMUNITY): Admission: RE | Admit: 2024-01-06 | Source: Ambulatory Visit

## 2024-01-06 DIAGNOSIS — R609 Edema, unspecified: Secondary | ICD-10-CM | POA: Insufficient documentation

## 2024-01-06 DIAGNOSIS — M7989 Other specified soft tissue disorders: Secondary | ICD-10-CM | POA: Diagnosis not present

## 2024-01-06 NOTE — Progress Notes (Signed)
 VASCULAR LAB    Bilateral lower extremity venous duplex has been performed.  See CV proc for preliminary results.   Daphane Odekirk, RVT 01/06/2024, 3:28 PM

## 2024-01-09 DIAGNOSIS — D696 Thrombocytopenia, unspecified: Secondary | ICD-10-CM | POA: Diagnosis not present

## 2024-01-09 DIAGNOSIS — R7303 Prediabetes: Secondary | ICD-10-CM | POA: Diagnosis not present

## 2024-01-09 DIAGNOSIS — J9611 Chronic respiratory failure with hypoxia: Secondary | ICD-10-CM | POA: Diagnosis not present

## 2024-01-09 DIAGNOSIS — D692 Other nonthrombocytopenic purpura: Secondary | ICD-10-CM | POA: Diagnosis not present

## 2024-01-09 DIAGNOSIS — I2510 Atherosclerotic heart disease of native coronary artery without angina pectoris: Secondary | ICD-10-CM | POA: Diagnosis not present

## 2024-01-09 DIAGNOSIS — F418 Other specified anxiety disorders: Secondary | ICD-10-CM | POA: Diagnosis not present

## 2024-01-09 DIAGNOSIS — I10 Essential (primary) hypertension: Secondary | ICD-10-CM | POA: Diagnosis not present

## 2024-01-16 DIAGNOSIS — J432 Centrilobular emphysema: Secondary | ICD-10-CM | POA: Diagnosis not present

## 2024-01-16 DIAGNOSIS — I25118 Atherosclerotic heart disease of native coronary artery with other forms of angina pectoris: Secondary | ICD-10-CM | POA: Diagnosis not present

## 2024-01-24 ENCOUNTER — Ambulatory Visit: Attending: Nurse Practitioner | Admitting: Nurse Practitioner

## 2024-01-24 ENCOUNTER — Encounter: Payer: Self-pay | Admitting: Nurse Practitioner

## 2024-01-24 VITALS — BP 110/60 | HR 73 | Ht 64.0 in | Wt 178.0 lb

## 2024-01-24 DIAGNOSIS — I5033 Acute on chronic diastolic (congestive) heart failure: Secondary | ICD-10-CM | POA: Diagnosis not present

## 2024-01-24 DIAGNOSIS — R072 Precordial pain: Secondary | ICD-10-CM

## 2024-01-24 DIAGNOSIS — I25119 Atherosclerotic heart disease of native coronary artery with unspecified angina pectoris: Secondary | ICD-10-CM | POA: Diagnosis not present

## 2024-01-24 DIAGNOSIS — I350 Nonrheumatic aortic (valve) stenosis: Secondary | ICD-10-CM | POA: Diagnosis not present

## 2024-01-24 DIAGNOSIS — E782 Mixed hyperlipidemia: Secondary | ICD-10-CM | POA: Diagnosis not present

## 2024-01-24 DIAGNOSIS — I615 Nontraumatic intracerebral hemorrhage, intraventricular: Secondary | ICD-10-CM | POA: Diagnosis not present

## 2024-01-24 DIAGNOSIS — J449 Chronic obstructive pulmonary disease, unspecified: Secondary | ICD-10-CM

## 2024-01-24 DIAGNOSIS — I10 Essential (primary) hypertension: Secondary | ICD-10-CM

## 2024-01-24 MED ORDER — FUROSEMIDE 20 MG PO TABS: 20.0000 mg | ORAL_TABLET | Freq: Every day | ORAL | 3 refills | Status: DC | PRN

## 2024-01-24 NOTE — Patient Instructions (Signed)
 Medication Instructions:  Your Physician recommend you continue on your current medication as directed.     Refill for Furosemide  has been sent to your pharmacy.  *If you need a refill on your cardiac medications before your next appointment, please call your pharmacy*  Lab Work: None ordered at this time  If you have labs (blood work) drawn today and your tests are completely normal, you will receive your results only by: MyChart Message (if you have MyChart) OR A paper copy in the mail If you have any lab test that is abnormal or we need to change your treatment, we will call you to review the results.  Testing/Procedures: Your provider has ordered a Lexiscan/ Exercise Myoview Stress test. This will take place at [facility]. Please report to the [facility] medical mall entrance. The volunteers at the first desk will direct you where to go.  [facility] MYOVIEW  Your provider has ordered a Stress Test with nuclear imaging. The purpose of this test is to evaluate the blood supply to your heart muscle. This procedure is referred to as a "Non-Invasive Stress Test." This is because other than having an IV started in your vein, nothing is inserted or "invades" your body. Cardiac stress tests are done to find areas of poor blood flow to the heart by determining the extent of coronary artery disease (CAD). Some patients exercise on a treadmill, which naturally increases the blood flow to your heart, while others who are unable to walk on a treadmill due to physical limitations will have a pharmacologic/chemical stress agent called Lexiscan . This medicine will mimic walking on a treadmill by temporarily increasing your coronary blood flow.   Please note: these test may take anywhere between 2-4 hours to complete  How to prepare for your Myoview test:  Nothing to eat for 6 hours prior to the test No caffeine for 24 hours prior to test No smoking 24 hours prior to test. Your medication may be taken with water.  If  your doctor stopped a medication because of this test, do not take that medication. Ladies, please do not wear dresses.  Skirts or pants are appropriate. Please wear a short sleeve shirt. No perfume, cologne or lotion. Wear comfortable walking shoes. No heels!   PLEASE NOTIFY THE OFFICE AT LEAST 24 HOURS IN ADVANCE IF YOU ARE UNABLE TO KEEP YOUR APPOINTMENT.  [phone] AND  PLEASE NOTIFY NUCLEAR MEDICINE AT [facility] AT LEAST 24 HOURS IN ADVANCE IF YOU ARE UNABLE TO KEEP YOUR APPOINTMENT. [phone]   Follow-Up: At [facility], you and your health needs are our priority.  As part of our continuing mission to provide you with exceptional heart care, our providers are all part of one team.  This team includes your primary Cardiologist (physician) and Advanced Practice Providers or APPs (Physician Assistants and Nurse Practitioners) who all work together to provide you with the care you need, when you need it.  Your next appointment:   1 month(s)  Provider:   Laneta Pintos, NP    We recommend signing up for the patient portal called "MyChart".  Sign up information is provided on this After Visit Summary.  MyChart is used to connect with patients for Virtual Visits (Telemedicine).  Patients are able to view lab/test results, encounter notes, upcoming appointments, etc.  Non-urgent messages can be sent to your provider as well.   To learn more about what you can do with MyChart, go to [email or web address].

## 2024-01-24 NOTE — Progress Notes (Signed)
 Office Visit    Patient Name: Carol Riley Date of Encounter: 01/24/2024  Primary Care Provider:  Melchor Spoon, MD Primary Cardiologist:  None - prev seen by Aleda Ammon, MD   Chief Complaint    83 y.o. female with a history of syncope, CAD, thrombocytopenia, hypertension, moderate aortic stenosis, PSVT, syncope/fall with intraventricular hemorrhage, peripheral vascular disease, prior DVT, COPD on PRN home O2, and peripheral neuropathy, who presents for follow-up related to dyspnea and chest pain.  Past Medical History   Subjective   Past Medical History:  Diagnosis Date   Arthritis    Cancer (HCC)    cervical cancer   Cataract cortical, senile    COPD (chronic obstructive pulmonary disease) (HCC)    Coronary artery disease    a. 06/2004 PCI: pLCX and RCA - Cypher DESs; b. 01/2012 Cath: patent stents->med rx; c. 04/2018 MV: mild ant ischemia   Depression    DVT (deep venous thrombosis) (HCC)    a. prev on warfarin.   Edema    FEET/LEGS   Hyperglycemia    Hypertension    Intraventricular hemorrhage (HCC)    a. 03/2023 - in setting of syncope - managed conservatively - oac d/c.   Leukopenia    Moderate aortic stenosis    a. 04/2018 Echo: EF>55%, mild MR/TR/PR/AS; b. 07/2021 Echo: EF>55%, mod AS; c. 07/2022 Echo: EF>55%, mod AS, mild TR/PR; d. 03/2023 Echo: EF 60-65%, no rwma, mild LVH, GrI DD, nl RV fxn, mild MR, mod AS (mean/peak grad 21.7/37.7 mmHg); e. 12/2023 Echo: EF >55%, GrI DD, Mod AS (peak vel 3.88m/s) mod PR, triv AI/MR/TR.   Neuropathy    Peripheral neuropathy    PSVT (paroxysmal supraventricular tachycardia) (HCC)    a. 03/2023 Zio: 56 SVT runs, fastest 226 x 9 beats, longest 24.9 secs @ 116 bpm-->bb increased.   Senile purpura (HCC)    Sleep apnea    Syncope    Thrombocytopenia (HCC)    Past Surgical History:  Procedure Laterality Date   BACK SURGERY     BREAST BIOPSY Left    benign needle   CHONDROPLASTY Left 05/13/2019   Procedure: CHONDROPLASTY;   Surgeon: Lorri Rota, MD;  Location: [facility] ORS;  Service: Orthopedics;  Laterality: Left;   COLONOSCOPY     COLONOSCOPY WITH PROPOFOL  N/A 02/24/2017   Procedure: COLONOSCOPY WITH PROPOFOL ;  Surgeon: Deveron Fly, MD;  Location: [facility] ENDOSCOPY;  Service: Endoscopy;  Laterality: N/A;   CORONARY ANGIOPLASTY     STENT   EYE SURGERY     GAS INSERTION Right 05/06/2015   Procedure: INSERTION OF GAS;  Surgeon: Stephenie Einstein, MD;  Location: [facility] ORS;  Service: Ophthalmology;  Laterality: Right;   KNEE ARTHROSCOPY WITH MEDIAL MENISECTOMY Left 05/13/2019   Procedure: KNEE ARTHROSCOPY WITH PARTIAL  MEDIAL and lateral MENISECTOMY;  Surgeon: Lorri Rota, MD;  Location: [facility] ORS;  Service: Orthopedics;  Laterality: Left;   MEMBRANE PEEL Right 05/06/2015   Procedure: MEMBRANE PEEL;  Surgeon: Stephenie Einstein, MD;  Location: [facility] ORS;  Service: Ophthalmology;  Laterality: Right;   PARS PLANA VITRECTOMY Right 05/06/2015   Procedure: PARS PLANA VITRECTOMY WITH 23 GAUGE;  Surgeon: Stephenie Einstein, MD;  Location: [facility] ORS;  Service: Ophthalmology;  Laterality: Right;    Allergies  Allergies  Allergen Reactions   Pneumovax [Pneumococcal Polysaccharide Vaccine] Swelling    Redness   Tizanidine Other (See Comments)    Caused low blood pressure       History of Present  Illness      83 y.o. y/o female  with a history of syncope, CAD, thrombocytopenia, hypertension, PSVT, moderate aortic stenosis, syncope/fall with intraventricular hemorrhage, peripheral vascular disease, prior DVT, COPD on PRN home O2, and peripheral neuropathy. She was previously followed by [facility] cardiology and required Cypher drug-eluting stent placement in the proximal left circumflex and RCA in November 2005.  Diagnostic catheterization in July 2013 revealed patent stents.  Stress testing in September 2019 showed mild anterior wall ischemia and she was medically managed.  In August 2024, she was admitted with syncope, left orbital  fracture, and intracranial hemorrhage requiring transfer to [facility].  She was managed conservatively.  During hospitalization, there was concern for atrial fibrillation though upon cardiology review, this was felt to represent sinus rhythm with PACs.  An echocardiogram during admission showed an EF of 60 to 65% with mild LVH, grade 1 diastolic dysfunction, normal RV function, and moderate AS.  She subsequently wore a ZIO monitor which showed predominantly sinus rhythm, 56 runs of SVT (longest 24.9 seconds at 116; fastest 226), frequent PACs and occasional PVCs.  Beta-blocker therapy was adjusted.  In light of absence of A-fib, remote history of DVT, and recent intraventricular hemorrhage, oral anticoagulation was not resumed.   [name] was last seen in cardiology clinic in February 2025 after having some issues with blood pressure and diarrhea at home.  She had stopped lisinopril  in the setting of soft blood pressures and subsequently developed hypertension and was placed on chlorthalidone, which she was tolerating.  [name] was recently seen in the emergency department on May 9 due to a several week history of increasing lower extremity swelling.  She was concerned that perhaps she was having a recurrence of DVT.  She says that for several weeks prior to ED visit, she noted increasing foot and ankle swelling in the context of chronic venous stasis.  She has also had dyspnea on exertion, now wearing oxygen around-the-clock.  She intermittently experiences exertional chest tightness, sometimes occurring if she is hurrying to the bathroom.  At her ED visit on May 9, lab work was notable for mildly elevated BNP 881.4.  Chest x-ray was unremarkable.  She was given a prescription for Lasix  20 mg which she has been using as needed.  She subsequently underwent vascular ultrasound which was negative for DVT.  She followed with her primary care provider and had an echocardiogram on May 20, which showed normal LV  function, grade 1 diastolic dysfunction, normal RV function, moderate AS and pulmonic regurgitation, and trivial AI/MR/TR.  Since her ED visit, she has used 4 doses of oral Lasix , last a few days ago, and notes that for the most part her swelling has been well-managed.  When discussing her diet, it is laden with processed foods as she notes she typically lives off of freezer based on microwave meals.  She also sleeps in a recliner and her legs are not fully elevated when that occurs.  She has compression socks but does not like wearing them.  She denies palpitations, PND, orthopnea, dizziness, syncope, or early satiety.   Objective   Home Medications    Current Outpatient Medications  Medication Sig Dispense Refill   albuterol  (VENTOLIN  HFA) 108 (90 Base) MCG/ACT inhaler Inhale 2 puffs into the lungs every 4 (four) hours as needed. 18 g 0   aspirin EC 81 MG tablet Take 81 mg by mouth daily. Swallow whole.     chlorthalidone (HYGROTON) 25 MG tablet Take  25 mg by mouth daily.     Cholecalciferol (VITAMIN D3 PO) Take 1 tablet by mouth daily.     citalopram (CELEXA) 20 MG tablet Take 20 mg by mouth daily.     clobetasol ointment (TEMOVATE) 0.05 % Apply 1 Application topically as directed.  APPLY FINGERTIP SIZE AMOUNT TO AFFECTED AREA 2-3 TIMES WEEKLY FOR MAINTENANCE     Fluticasone -Umeclidin-Vilant (TRELEGY ELLIPTA ) 200-62.5-25 MCG/ACT AEPB Inhale 1 puff into the lungs daily in the afternoon. 60 each 11   gabapentin (NEURONTIN) 300 MG capsule Take 1 capsule by mouth 2 (two) times daily.     hydrocortisone  cream 1 % Apply 1 application topically daily as needed for itching.     metoprolol  succinate (TOPROL -XL) 25 MG 24 hr tablet Take 1 tablet (25 mg total) by mouth daily. 90 tablet 0   Multiple Vitamin (MULTIVITAMIN) tablet Take 1 tablet by mouth daily.     OXYGEN Inhale 2 L into the lungs at bedtime.     simvastatin  (ZOCOR ) 20 MG tablet Take 20 mg by mouth at bedtime.      Spacer/Aero-Holding  Chambers (AEROCHAMBER MV) inhaler Use as instructed 1 each 2   triamcinolone cream (KENALOG) 0.5 % Apply topically.     benzonatate  (TESSALON ) 100 MG capsule Take 2 capsules (200 mg total) by mouth every 8 (eight) hours. (Patient not taking: Reported on 01/24/2024) 21 capsule 0   Fluticasone -Umeclidin-Vilant (TRELEGY ELLIPTA ) 200-62.5-25 MCG/ACT AEPB Inhale 1 puff into the lungs daily at 6 (six) AM. (Patient not taking: Reported on 01/24/2024)     furosemide  (LASIX ) 20 MG tablet Take 1 tablet (20 mg total) by mouth daily as needed. 30 tablet 3   No current facility-administered medications for this visit.     Physical Exam    VS:  BP 110/60 (BP Location: Left Arm, Patient Position: Sitting, Cuff Size: Normal)   Pulse 73   Ht 5\' 4"  (1.626 m)   Wt 178 lb (80.7 kg)   SpO2 94%   BMI 30.55 kg/m  , BMI Body mass index is 30.55 kg/m.       GEN: Well nourished, well developed, in no acute distress. HEENT: normal. Neck: Supple, no JVD, carotid bruits, or masses. Cardiac: RRR, 2/6 systolic murmur at the upper sternal borders, no rubs or gallops. No clubbing.  Bilateral lower extremity discoloration consistent with venous stasis with trace bilateral woody edema extending to the calves.  Radials 2+/PT 1+ and equal bilaterally.  Respiratory:  Respirations regular and unlabored, faint expiratory wheezing. GI: Soft, nontender, nondistended, BS + x 4. MS: no deformity or atrophy. Skin: warm and dry, no rash. Neuro:  Strength and sensation are intact. Psych: Normal affect.  Accessory Clinical Findings    ECG personally reviewed by me today - EKG Interpretation Date/Time:  Wednesday Jan 24 2024 14:09:33 EDT Ventricular Rate:  73 PR Interval:  170 QRS Duration:  82 QT Interval:  428 QTC Calculation: 471 R Axis:   53  Text Interpretation: Normal sinus rhythm with sinus arrhythmia Normal ECG Confirmed by Laneta Pintos [phone]) on 01/24/2024 2:14:41 PM  - no acute changes.  Lab Results   Component Value Date   WBC 5.8 01/05/2024   HGB 15.1 (H) 01/05/2024   HCT 44.8 01/05/2024   MCV 94.7 01/05/2024   PLT 165 01/05/2024   Lab Results  Component Value Date   CREATININE 1.12 (H) 01/05/2024   BUN 19 01/05/2024   NA 139 01/05/2024   K 3.8 01/05/2024   CL 99  01/05/2024   CO2 30 01/05/2024   Lab Results  Component Value Date   ALT 27 04/21/2014   AST 28 04/21/2014   ALKPHOS 63 04/21/2014   BILITOT 0.4 04/21/2014      Assessment & Plan    1.  Acute on chronic HFpEF: Patient with recent development of lower extremity swelling prompting her to be seen in the emergency department on May 9 with mild elevation of BNP at that time 181.  She was prescribed Lasix  20 mg as needed and has noted good response after 4 doses in the past 3 weeks.  She does have evidence of chronic venous stasis.  Recent echo showed normal LV function with grade 1 diastolic dysfunction and moderate AS, which is stable.  She has trace woody edema on examination.  She eats quite a bit of processed freezer based/microwave meals.  She also sleeps in a recliner without her legs elevated.  We discussed conservative management such as reducing salt, keeping legs elevated when sitting or sleeping, and using compression socks.  She has compression socks but has not used but is willing to consider.  I will refill Lasix  20 mg to be used as needed for weight gain of 3 pounds in 24 hours or 5 pounds over the course of the week.  We discussed this today.  In the setting of progressive dyspnea and also intermittent chest tightness (see below), I am also arranging for a Lexiscan Myoview.  2.  Coronary artery disease: Status post prior Cypher drug-eluting stent placement to the left circumflex and RCA in November 2005 with patent stents in 2013 and reported anterior ischemia on stress testing in 2019.  As noted above, she has had some progression of dyspnea on exertion in the setting of both HFpEF and also COPD requiring  oxygen, but also has had intermittent exertional chest tightness.  Arranging for Lexiscan Myoview.  Continue aspirin, beta-blocker, and statin therapy.  3.  Primary hypertension: Stable on beta-blocker and diuretic therapy.  In the setting of chronic chlorthalidone, she is aware that if Lasix  dosing increases, we will need to be notified so that we can follow-up lab work.  4.  Hyperlipidemia: LDL of 65 earlier this month.  She remains on simvastatin  therapy.  5.  Moderate aortic stenosis: Stable, moderate aortic stenosis by recent echo.  Plan to follow-up in 1 to 2 years.  6.  History of intraventricular hemorrhage: In the setting of fall in the summer 2024.  She was managed conservatively and has recovered.  7.  COPD: Not using oxygen around-the-clock.  Likely contributing to worsening dyspnea on exertion.  Faint wheezing on examination today.  She uses albuterol  as needed.  8.  Disposition: Follow-up Lexiscan Myoview.  Follow-up in clinic in 1 month or sooner if necessary.  Laneta Pintos, NP 01/24/2024, 5:08 PM

## 2024-01-31 DIAGNOSIS — R7303 Prediabetes: Secondary | ICD-10-CM | POA: Diagnosis not present

## 2024-01-31 DIAGNOSIS — G63 Polyneuropathy in diseases classified elsewhere: Secondary | ICD-10-CM | POA: Diagnosis not present

## 2024-01-31 DIAGNOSIS — B351 Tinea unguium: Secondary | ICD-10-CM | POA: Diagnosis not present

## 2024-01-31 DIAGNOSIS — L97521 Non-pressure chronic ulcer of other part of left foot limited to breakdown of skin: Secondary | ICD-10-CM | POA: Diagnosis not present

## 2024-02-07 DIAGNOSIS — H35372 Puckering of macula, left eye: Secondary | ICD-10-CM | POA: Diagnosis not present

## 2024-02-07 DIAGNOSIS — Z961 Presence of intraocular lens: Secondary | ICD-10-CM | POA: Diagnosis not present

## 2024-02-07 DIAGNOSIS — H10531 Contact blepharoconjunctivitis, right eye: Secondary | ICD-10-CM | POA: Diagnosis not present

## 2024-02-07 DIAGNOSIS — H35341 Macular cyst, hole, or pseudohole, right eye: Secondary | ICD-10-CM | POA: Diagnosis not present

## 2024-02-13 ENCOUNTER — Encounter
Admission: RE | Admit: 2024-02-13 | Discharge: 2024-02-13 | Disposition: A | Discharge status: Home / Self Care | Source: Ambulatory Visit | Attending: Nurse Practitioner | Admitting: Nurse Practitioner

## 2024-02-13 DIAGNOSIS — R072 Precordial pain: Secondary | ICD-10-CM

## 2024-02-13 MED ORDER — TECHNETIUM TC 99M TETROFOSMIN IV KIT
30.7200 | PACK | Freq: Once | INTRAVENOUS | Status: AC | PRN
  Administered 2024-02-13: 30.72 via INTRAVENOUS

## 2024-02-13 MED ORDER — REGADENOSON 0.4 MG/5ML IV SOLN
0.4000 mg | Freq: Once | INTRAVENOUS | Status: AC
  Administered 2024-02-13: 0.4 mg via INTRAVENOUS

## 2024-02-13 MED ORDER — TECHNETIUM TC 99M TETROFOSMIN IV KIT
11.0800 | PACK | Freq: Once | INTRAVENOUS | Status: AC | PRN
  Administered 2024-02-13: 11.08 via INTRAVENOUS

## 2024-02-14 ENCOUNTER — Ambulatory Visit: Payer: Self-pay | Admitting: Nurse Practitioner

## 2024-02-14 LAB — NM MYOCAR MULTI W/SPECT W/WALL MOTION / EF
LV dias vol: 64 mL (ref 46–106)
LV sys vol: 25 mL (ref 3.8–5.2)
Nuc Stress EF: 61 %
Peak HR: 76 {beats}/min
Percent HR: 55 %
Rest HR: 62 {beats}/min
Rest Nuclear Isotope Dose: 11.1 mCi
SDS: 3
SRS: 8
SSS: 9
ST Depression (mm): 0 mm
Stress Nuclear Isotope Dose: 30.7 mCi
TID: 1.03

## 2024-02-16 DIAGNOSIS — J432 Centrilobular emphysema: Secondary | ICD-10-CM | POA: Diagnosis not present

## 2024-02-21 ENCOUNTER — Ambulatory Visit: Attending: Nurse Practitioner | Admitting: Nurse Practitioner

## 2024-02-21 ENCOUNTER — Encounter: Payer: Self-pay | Admitting: Nurse Practitioner

## 2024-02-21 VITALS — BP 124/60 | HR 79 | Ht 63.0 in | Wt 174.5 lb

## 2024-02-21 DIAGNOSIS — I350 Nonrheumatic aortic (valve) stenosis: Secondary | ICD-10-CM | POA: Diagnosis not present

## 2024-02-21 DIAGNOSIS — I5032 Chronic diastolic (congestive) heart failure: Secondary | ICD-10-CM | POA: Diagnosis not present

## 2024-02-21 DIAGNOSIS — I615 Nontraumatic intracerebral hemorrhage, intraventricular: Secondary | ICD-10-CM | POA: Diagnosis not present

## 2024-02-21 DIAGNOSIS — E782 Mixed hyperlipidemia: Secondary | ICD-10-CM | POA: Diagnosis not present

## 2024-02-21 DIAGNOSIS — J449 Chronic obstructive pulmonary disease, unspecified: Secondary | ICD-10-CM | POA: Diagnosis not present

## 2024-02-21 DIAGNOSIS — I25119 Atherosclerotic heart disease of native coronary artery with unspecified angina pectoris: Secondary | ICD-10-CM | POA: Diagnosis not present

## 2024-02-21 DIAGNOSIS — I10 Essential (primary) hypertension: Secondary | ICD-10-CM

## 2024-02-21 NOTE — Progress Notes (Signed)
 Office Visit    Patient Name: Carol Riley Date of Encounter: 02/21/2024  Primary Care Provider:  [name], MD Primary Cardiologist:  None - prev seen by DOROTHA Lesches, MD  Chief Complaint    83 y.o. female with a history of syncope, CAD, thrombocytopenia, hypertension, moderate aortic stenosis, PSVT, syncope/fall with intraventricular hemorrhage, peripheral vascular disease, prior DVT, COPD on PRN home O2, and peripheral neuropathy, who presents for follow-up after recent stress testing.  Past Medical History   Subjective   Past Medical History:  Diagnosis Date   Arthritis    Cancer (HCC)    cervical cancer   Cataract cortical, senile    COPD (chronic obstructive pulmonary disease) (HCC)    Coronary artery disease    a. 06/2004 PCI: pLCX and RCA - Cypher DESs; b. 01/2012 Cath: patent stents->med rx; c. 04/2018 MV: mild ant ischemia; d. 01/2024 MV: EF 55-65%, no isch/scar.   Depression    DVT (deep venous thrombosis) (HCC)    a. prev on warfarin.   Edema    FEET/LEGS   Hyperglycemia    Hypertension    Intraventricular hemorrhage (HCC)    a. 03/2023 - in setting of syncope - managed conservatively - oac d/c.   Leukopenia    Moderate aortic stenosis    a. 04/2018 Echo: EF>55%, mild MR/TR/PR/AS; b. 07/2021 Echo: EF>55%, mod AS; c. 07/2022 Echo: EF>55%, mod AS, mild TR/PR; d. 03/2023 Echo: EF 60-65%, no rwma, mild LVH, GrI DD, nl RV fxn, mild MR, mod AS (mean/peak grad 21.7/37.7 mmHg); e. 12/2023 Echo: EF >55%, GrI DD, Mod AS (peak vel 3.83m/s) mod PR, triv AI/MR/TR.   Neuropathy    Peripheral neuropathy    PSVT (paroxysmal supraventricular tachycardia) (HCC)    a. 03/2023 Zio: 56 SVT runs, fastest 226 x 9 beats, longest 24.9 secs @ 116 bpm-->bb increased.   Senile purpura (HCC)    Sleep apnea    Syncope    Thrombocytopenia (HCC)    Past Surgical History:  Procedure Laterality Date   BACK SURGERY     BREAST BIOPSY Left    benign needle   CHONDROPLASTY Left 05/13/2019    Procedure: CHONDROPLASTY;  Surgeon: Tobie Priest, MD;  Location: [facility] ORS;  Service: Orthopedics;  Laterality: Left;   COLONOSCOPY     COLONOSCOPY WITH PROPOFOL  N/A 02/24/2017   Procedure: COLONOSCOPY WITH PROPOFOL ;  Surgeon: Gaylyn Gladis PENNER, MD;  Location: [facility] ENDOSCOPY;  Service: Endoscopy;  Laterality: N/A;   CORONARY ANGIOPLASTY     STENT   EYE SURGERY     GAS INSERTION Right 05/06/2015   Procedure: INSERTION OF GAS;  Surgeon: Harlene Scarce, MD;  Location: [facility] ORS;  Service: Ophthalmology;  Laterality: Right;   KNEE ARTHROSCOPY WITH MEDIAL MENISECTOMY Left 05/13/2019   Procedure: KNEE ARTHROSCOPY WITH PARTIAL  MEDIAL and lateral MENISECTOMY;  Surgeon: Tobie Priest, MD;  Location: [facility] ORS;  Service: Orthopedics;  Laterality: Left;   MEMBRANE PEEL Right 05/06/2015   Procedure: MEMBRANE PEEL;  Surgeon: Harlene Scarce, MD;  Location: [facility] ORS;  Service: Ophthalmology;  Laterality: Right;   PARS PLANA VITRECTOMY Right 05/06/2015   Procedure: PARS PLANA VITRECTOMY WITH 23 GAUGE;  Surgeon: Harlene Scarce, MD;  Location: [facility] ORS;  Service: Ophthalmology;  Laterality: Right;    Allergies  Allergies  Allergen Reactions   Pneumovax [Pneumococcal Polysaccharide Vaccine] Swelling    Redness   Tizanidine Other (See Comments)    Caused low blood pressure  History of Present Illness      83 y.o. y/o female with a history of syncope, CAD, thrombocytopenia, hypertension, PSVT, moderate aortic stenosis, syncope/fall with intraventricular hemorrhage, peripheral vascular disease, prior DVT, COPD on PRN home O2, and peripheral neuropathy. She was previously followed by [facility] cardiology and required Cypher drug-eluting stent placement in the proximal left circumflex and RCA in November 2005.  Diagnostic catheterization in July 2013 revealed patent stents.  Stress testing in September 2019 showed mild anterior wall ischemia and she was medically managed.  In August 2024, she was admitted  with syncope, left orbital fracture, and intracranial hemorrhage requiring transfer to [facility].  She was managed conservatively.  During hospitalization, there was concern for atrial fibrillation though upon cardiology review, this was felt to represent sinus rhythm with PACs.  An echocardiogram during admission showed an EF of 60 to 65% with mild LVH, grade 1 diastolic dysfunction, normal RV function, and moderate AS.  She subsequently wore a ZIO monitor which showed predominantly sinus rhythm, 56 runs of SVT (longest 24.9 seconds at 116; fastest 226), frequent PACs and occasional PVCs.  Beta-blocker therapy was adjusted.  In light of absence of A-fib, remote history of DVT, and recent intraventricular hemorrhage, oral anticoagulation was not resumed.   Carol Riley was seen in the emergency department in May 2025 due to dyspnea, chest tightness, and lower extremity swelling.  BNP was 881.4.  Chest x-ray was unremarkable.  Vasculature was negative for DVT.  She was discharged on Lasix .  Follow-up echo on Jan 16, 2024 showed normal LV function with grade 1 diastolic dysfunction, normal RV function, moderate aortic stenosis, moderate PR, trivial AI/MR/TR.  She subsequently underwent Lexiscan  Myoview , which was low risk without ischemia or infarct, with normal LV function.   Since her last visit, Carol Riley has been relatively stable.  She has chronic, stable dyspnea on exertion.  She denies chest pain.  She has been wheezing more frequently in the setting of warm weather and uses Trelegy and Ventolin .  Her lower extremity swelling has improved significantly.  She denies palpitations, PND, orthopnea, dizziness, syncope, or early satiety. Objective   Home Medications    Current Outpatient Medications  Medication Sig Dispense Refill   albuterol  (VENTOLIN  HFA) 108 (90 Base) MCG/ACT inhaler Inhale 2 puffs into the lungs every 4 (four) hours as needed. 18 g 0   aspirin EC 81 MG tablet Take 81 mg by mouth daily.  Swallow whole.     benzonatate  (TESSALON ) 100 MG capsule Take 2 capsules (200 mg total) by mouth every 8 (eight) hours. 21 capsule 0   chlorthalidone (HYGROTON) 25 MG tablet Take 25 mg by mouth daily.     Cholecalciferol (VITAMIN D3 PO) Take 1 tablet by mouth daily.     citalopram (CELEXA) 20 MG tablet Take 20 mg by mouth daily.     clobetasol ointment (TEMOVATE) 0.05 % Apply 1 Application topically as directed.  APPLY FINGERTIP SIZE AMOUNT TO AFFECTED AREA 2-3 TIMES WEEKLY FOR MAINTENANCE     Fluticasone -Umeclidin-Vilant (TRELEGY ELLIPTA ) 200-62.5-25 MCG/ACT AEPB Inhale 1 puff into the lungs daily in the afternoon. 60 each 11   furosemide  (LASIX ) 20 MG tablet Take 1 tablet (20 mg total) by mouth daily as needed. 30 tablet 3   gabapentin (NEURONTIN) 300 MG capsule Take 1 capsule by mouth 2 (two) times daily.     hydrocortisone  cream 1 % Apply 1 application topically daily as needed for itching.     metoprolol  succinate (TOPROL -XL)  25 MG 24 hr tablet Take 1 tablet (25 mg total) by mouth daily. 90 tablet 0   Multiple Vitamin (MULTIVITAMIN) tablet Take 1 tablet by mouth daily.     OXYGEN Inhale 2 L into the lungs at bedtime.     simvastatin  (ZOCOR ) 20 MG tablet Take 20 mg by mouth at bedtime.      Spacer/Aero-Holding Chambers (AEROCHAMBER MV) inhaler Use as instructed 1 each 2   triamcinolone cream (KENALOG) 0.5 % Apply topically.     Fluticasone -Umeclidin-Vilant (TRELEGY ELLIPTA ) 200-62.5-25 MCG/ACT AEPB Inhale 1 puff into the lungs daily at 6 (six) AM. (Patient not taking: Reported on 02/21/2024)     No current facility-administered medications for this visit.     Physical Exam    VS:  BP 124/60 (BP Location: Left Arm, Patient Position: Sitting, Cuff Size: Normal)   Pulse 79   Ht 5' 3 (1.6 m)   Wt 174 lb 8 oz (79.2 kg)   SpO2 95%   BMI 30.91 kg/m  , BMI Body mass index is 30.91 kg/m.          GEN: Well nourished, well developed, in no acute distress. HEENT: normal. Neck: Supple, no  JVD, carotid bruits, or masses. Cardiac: RRR, 3/6 systolic murmur heard throughout, loudest at the upper sternal borders. No clubbing, cyanosis, trace bilateral woody ankle edema.  Radials 2+/PT 1+ and equal bilaterally.  Respiratory:  Respirations regular and unlabored, diminished breath sounds with faint inspiratory and expiratory wheezing. GI: Soft, nontender, nondistended, BS + x 4. MS: no deformity or atrophy. Skin: warm and dry, no rash. Neuro:  Strength and sensation are intact. Psych: Normal affect.  Accessory Clinical Findings    Lab Results  Component Value Date   WBC 5.8 01/05/2024   HGB 15.1 (H) 01/05/2024   HCT 44.8 01/05/2024   MCV 94.7 01/05/2024   PLT 165 01/05/2024   Lab Results  Component Value Date   CREATININE 1.12 (H) 01/05/2024   BUN 19 01/05/2024   NA 139 01/05/2024   K 3.8 01/05/2024   CL 99 01/05/2024   CO2 30 01/05/2024   Lab Results  Component Value Date   ALT 27 04/21/2014   AST 28 04/21/2014   ALKPHOS 63 04/21/2014   BILITOT 0.4 04/21/2014       Assessment & Plan    1.  Chronic heart failure with preserved ejection fraction: Echo in May 2025 showed normal LV function with grade 1 diastolic dysfunction, and moderate, stable aortic stenosis.  Since her last visit, she notes stable dyspnea on exertion and improvement in lower extremity swelling by keeping her legs elevated and reducing salt.  She uses Lasix  as needed.  She has trace ankle edema on examination today and her weight is down 4 pounds since her last visit.  Heart rate and blood pressure are stable.  Continue current regimen.  We discussed the importance of daily weights, sodium restriction, medication compliance, and symptom reporting and she verbalizes understanding.   2.  Coronary artery disease: Status post prior Cypher drug-eluting stent placement to the left circumflex and RCA in November 2005 with patent stents 2013.  Stress testing in 2019 with anterior ischemia.  Following her  last visit, in light of progression of dyspnea on exertion and intermittent exertional chest tightness, Lexiscan  Myoview  was performed and was low risk without ischemia or infarct.  She has been doing well without chest pain.  Continue aspirin, beta-blocker, and statin therapy.  3.  Primary hypertension: Blood pressure  is stable on beta-blocker and diuretic therapy.  4.  Hyperlipidemia: LDL of 65 in May 2025.  He remains on simvastatin  therapy.  5.  Moderate aortic stenosis: Stable, moderate aortic stenosis by echo in May 2025.  Plan to follow-up echo in 1 year.  6.  History of intraventricular hemorrhage: In the setting of fall in summer 2024.  She was managed conservatively and has recovered.  7.  COPD: Not currently requiring oxygen around-the-clock.  Faint wheezing on examination today-feels that it is worse in the setting of hot weather.  She uses Trelegy and albuterol .  8.  Disposition: Follow-up in 4 months or sooner if necessary.  Lonni Meager, NP 02/21/2024, 2:18 PM

## 2024-02-21 NOTE — Patient Instructions (Addendum)
 Medication Instructions:  Your physician recommends that you continue on your current medications as directed. Please refer to the Current Medication list given to you today.   *If you need a refill on your cardiac medications before your next appointment, please call your pharmacy*  Lab Work: None ordered at this time   Follow-Up: At [facility], you and your health needs are our priority.  As part of our continuing mission to provide you with exceptional heart care, our providers are all part of one team.  This team includes your primary Cardiologist (physician) and Advanced Practice Providers or APPs (Physician Assistants and Nurse Practitioners) who all work together to provide you with the care you need, when you need it.  Your next appointment:   4 month(s)  Provider:   You may see Dr Redell Cave to establish continuity of provider care after Dr Court and Dr Fernande  We recommend signing up for the patient portal called MyChart.  Sign up information is provided on this After Visit Summary.  MyChart is used to connect with patients for Virtual Visits (Telemedicine).  Patients are able to view lab/test results, encounter notes, upcoming appointments, etc.  Non-urgent messages can be sent to your provider as well.   To learn more about what you can do with MyChart, go to [email or web address].

## 2024-05-01 ENCOUNTER — Other Ambulatory Visit: Payer: Self-pay | Admitting: Nurse Practitioner

## 2024-05-24 ENCOUNTER — Ambulatory Visit: Attending: Cardiology | Admitting: Cardiology

## 2024-05-24 ENCOUNTER — Encounter: Payer: Self-pay | Admitting: Cardiology

## 2024-05-24 VITALS — BP 145/74 | HR 75 | Ht 64.0 in | Wt 175.4 lb

## 2024-05-24 DIAGNOSIS — E782 Mixed hyperlipidemia: Secondary | ICD-10-CM | POA: Diagnosis not present

## 2024-05-24 DIAGNOSIS — I2510 Atherosclerotic heart disease of native coronary artery without angina pectoris: Secondary | ICD-10-CM

## 2024-05-24 DIAGNOSIS — I350 Nonrheumatic aortic (valve) stenosis: Secondary | ICD-10-CM | POA: Diagnosis not present

## 2024-05-24 DIAGNOSIS — I10 Essential (primary) hypertension: Secondary | ICD-10-CM | POA: Diagnosis not present

## 2024-05-24 DIAGNOSIS — R0602 Shortness of breath: Secondary | ICD-10-CM

## 2024-05-24 NOTE — Progress Notes (Signed)
 Cardiology Office Note:    Date:  05/24/2024   ID:  [name], DOB [birth year], MRN [id number]  PCP:  [name], MD   [facility] Providers Cardiologist:  Redell Cave, MD     Referring MD: [name], MD   Chief Complaint  Patient presents with   Follow-up    4 month follow up visit. Patient states that her breathing is pain and for the last two weeks the patient has been tired. Meds reviewed.     History of Present Illness:    Carol Riley is a 83 y.o. female with a hx of CAD s/p PCI/DES to LCx, RCA 2005, (LHC 2013 patent stents), hypertension, hyperlipidemia, HFpEF, moderate aortic valve stenosis, former smoker, COPD on 2 close oxygen presenting for follow-up.  Endorses shortness of breath at baseline which she attributes to COPD.  Switching care from [facility] to [facility], has upcoming appointment with pulmonary medicine.  States edema is adequately controlled, takes Lasix  only as needed.   Prior notes/testing Lexiscan  Myoview  6/25 low risk study, no significant ischemia. Echo 03/2023 EF 60 to 65%, impaired relaxation, aortic valve mean gradient 21.7 mmHg, V-max 3.1 m/s  Past Medical History:  Diagnosis Date   Arthritis    Cancer (HCC)    cervical cancer   Cataract cortical, senile    COPD (chronic obstructive pulmonary disease) (HCC)    Coronary artery disease    a. 06/2004 PCI: pLCX and RCA - Cypher DESs; b. 01/2012 Cath: patent stents->med rx; c. 04/2018 MV: mild ant ischemia; d. 01/2024 MV: EF 55-65%, no isch/scar.   Depression    DVT (deep venous thrombosis) (HCC)    a. prev on warfarin.   Edema    FEET/LEGS   Hyperglycemia    Hypertension    Intraventricular hemorrhage (HCC)    a. 03/2023 - in setting of syncope - managed conservatively - oac d/c.   Leukopenia    Moderate aortic stenosis    a. 04/2018 Echo: EF>55%, mild MR/TR/PR/AS; b. 07/2021 Echo: EF>55%, mod AS; c. 07/2022 Echo: EF>55%, mod AS, mild TR/PR; d. 03/2023 Echo: EF  60-65%, no rwma, mild LVH, GrI DD, nl RV fxn, mild MR, mod AS (mean/peak grad 21.7/37.7 mmHg); e. 12/2023 Echo: EF >55%, GrI DD, Mod AS (peak vel 3.13m/s) mod PR, triv AI/MR/TR.   Neuropathy    Peripheral neuropathy    PSVT (paroxysmal supraventricular tachycardia)    a. 03/2023 Zio: 56 SVT runs, fastest 226 x 9 beats, longest 24.9 secs @ 116 bpm-->bb increased.   Senile purpura    Sleep apnea    Syncope    Thrombocytopenia     Past Surgical History:  Procedure Laterality Date   BACK SURGERY     BREAST BIOPSY Left    benign needle   CHONDROPLASTY Left 05/13/2019   Procedure: CHONDROPLASTY;  Surgeon: Tobie Priest, MD;  Location: [facility] ORS;  Service: Orthopedics;  Laterality: Left;   COLONOSCOPY     COLONOSCOPY WITH PROPOFOL  N/A 02/24/2017   Procedure: COLONOSCOPY WITH PROPOFOL ;  Surgeon: Gaylyn Gladis PENNER, MD;  Location: [facility] ENDOSCOPY;  Service: Endoscopy;  Laterality: N/A;   CORONARY ANGIOPLASTY     STENT   EYE SURGERY     GAS INSERTION Right 05/06/2015   Procedure: INSERTION OF GAS;  Surgeon: Harlene Scarce, MD;  Location: [facility] ORS;  Service: Ophthalmology;  Laterality: Right;   KNEE ARTHROSCOPY WITH MEDIAL MENISECTOMY Left 05/13/2019   Procedure: KNEE ARTHROSCOPY WITH PARTIAL  MEDIAL and lateral MENISECTOMY;  Surgeon: Tobie Priest, MD;  Location: [facility] ORS;  Service: Orthopedics;  Laterality: Left;   MEMBRANE PEEL Right 05/06/2015   Procedure: MEMBRANE PEEL;  Surgeon: Harlene Scarce, MD;  Location: [facility] ORS;  Service: Ophthalmology;  Laterality: Right;   PARS PLANA VITRECTOMY Right 05/06/2015   Procedure: PARS PLANA VITRECTOMY WITH 23 GAUGE;  Surgeon: Harlene Scarce, MD;  Location: [facility] ORS;  Service: Ophthalmology;  Laterality: Right;    Current Medications: Current Meds  Medication Sig   albuterol  (VENTOLIN  HFA) 108 (90 Base) MCG/ACT inhaler Inhale 2 puffs into the lungs every 4 (four) hours as needed.   aspirin EC 81 MG tablet Take 81 mg by mouth daily. Swallow whole.    benzonatate  (TESSALON ) 100 MG capsule Take 2 capsules (200 mg total) by mouth every 8 (eight) hours.   chlorthalidone (HYGROTON) 25 MG tablet Take 25 mg by mouth daily.   Cholecalciferol (VITAMIN D3 PO) Take 1 tablet by mouth daily.   citalopram (CELEXA) 20 MG tablet Take 20 mg by mouth daily.   clobetasol ointment (TEMOVATE) 0.05 % Apply 1 Application topically as directed.  APPLY FINGERTIP SIZE AMOUNT TO AFFECTED AREA 2-3 TIMES WEEKLY FOR MAINTENANCE   Fluticasone -Umeclidin-Vilant (TRELEGY ELLIPTA ) 200-62.5-25 MCG/ACT AEPB Inhale 1 puff into the lungs daily in the afternoon.   furosemide  (LASIX ) 20 MG tablet TAKE 1 TABLET BY MOUTH EVERY DAY AS NEEDED   gabapentin (NEURONTIN) 300 MG capsule Take 1 capsule by mouth 2 (two) times daily.   hydrocortisone  cream 1 % Apply 1 application topically daily as needed for itching.   metoprolol  succinate (TOPROL -XL) 25 MG 24 hr tablet Take 1 tablet (25 mg total) by mouth daily.   Multiple Vitamin (MULTIVITAMIN) tablet Take 1 tablet by mouth daily.   OXYGEN Inhale 2 L into the lungs at bedtime.   simvastatin  (ZOCOR ) 20 MG tablet Take 20 mg by mouth at bedtime.    Spacer/Aero-Holding Chambers (AEROCHAMBER MV) inhaler Use as instructed   triamcinolone cream (KENALOG) 0.5 % Apply topically.     Allergies:   Pneumovax [pneumococcal polysaccharide vaccine] and Tizanidine   Social History   Socioeconomic History   Marital status: Divorced    Spouse name: Not on file   Number of children: Not on file   Years of education: Not on file   Highest education level: Not on file  Occupational History   Not on file  Tobacco Use   Smoking status: Former    Current packs/day: 0.00    Types: Cigarettes    Quit date: 08/29/1998    Years since quitting: 25.7   Smokeless tobacco: Never   Tobacco comments:    Started smoking at age 72, smoked 1 pack a day.  quit in 2000.   Vaping Use   Vaping status: Never Used  Substance and Sexual Activity   Alcohol use: No    Drug use: No   Sexual activity: Not on file  Other Topics Concern   Not on file  Social History Narrative   Not on file   Social Drivers of Health   Financial Resource Strain: Not on file  Food Insecurity: Food Insecurity Present (04/05/2023)   Hunger Vital Sign    Worried About Running Out of Food in the Last Year: Sometimes true    Ran Out of Food in the Last Year: Sometimes true  Transportation Needs: No Transportation Needs (04/05/2023)   PRAPARE - Administrator, Civil Service (Medical): No  Lack of Transportation (Non-Medical): No  Physical Activity: Not on file  Stress: Not on file  Social Connections: Not on file     Family History: The patient's family history includes Breast cancer (age of onset: 47) in her sister; Cancer in her mother.  ROS:   Please see the history of present illness.     All other systems reviewed and are negative.  EKGs/Labs/Other Studies Reviewed:    The following studies were reviewed today:       Recent Labs: 01/05/2024: B Natriuretic Peptide 181.4; BUN 19; Creatinine, Ser 1.12; Hemoglobin 15.1; Platelets 165; Potassium 3.8; Sodium 139  Recent Lipid Panel No results found for: CHOL, TRIG, HDL, CHOLHDL, VLDL, LDLCALC, LDLDIRECT  Lipid panel 12/2023 total cholesterol 163, triglycerides 173, LDL 65, HDL 64  Risk Assessment/Calculations:            Physical Exam:    VS:  BP (!) 145/74   Pulse 75   Ht 5' 4 (1.626 m)   Wt 175 lb 6.4 oz (79.6 kg)   SpO2 (!) 88%   BMI 30.11 kg/m     Wt Readings from Last 3 Encounters:  05/24/24 175 lb 6.4 oz (79.6 kg)  02/21/24 174 lb 8 oz (79.2 kg)  01/24/24 178 lb (80.7 kg)     GEN:  Well nourished, well developed in no acute distress HEENT: Normal NECK: No JVD; No carotid bruits LYMPHATICS: No lymphadenopathy CARDIAC: RRR, 2/6 systolic murmur RESPIRATORY: Bilateral wheezing, rhonchi noted ABDOMEN: Soft, non-tender, non-distended MUSCULOSKELETAL:  No edema; No  deformity  SKIN: Warm and dry NEUROLOGIC:  Alert and oriented x 3 PSYCHIATRIC:  Normal affect   ASSESSMENT:    1. Coronary artery disease involving native heart without angina pectoris, unspecified vessel or lesion type   2. Moderate aortic stenosis   3. Primary hypertension   4. Hyperlipidemia, mixed   5. Shortness of breath    PLAN:    In order of problems listed above:  CAD, PCI LCx, RCA 2005.  LHC 2013 with patent stents.  Denies chest pain.  Lexiscan  Myoview  6/25 showed no significant ischemia.  Continue aspirin 81 mg daily, simvastatin  20 mg daily. Moderate aortic stenosis, last echo 2024.  Repeat echocardiogram to evaluate any progression of aortic valve disease. Hypertension, BP controlled.  Continue Toprol -XL 25 mg daily, chlorthalidone 25 mg daily. Hyperlipidemia, cholesterol controlled, continue simvastatin  20 mg daily. Dyspnea, wheezing, trace edema.  Advised to take Lasix  over the next 3 days.  Removed earlier appointment with pulmonary medicine.  Lexiscan  Myoview  6/25 showed no significant ischemia.  Follow-up in 4 to 5 months after echo.     Medication Adjustments/Labs and Tests Ordered: Current medicines are reviewed at length with the patient today.  Concerns regarding medicines are outlined above.  Orders Placed This Encounter  Procedures   ECHOCARDIOGRAM COMPLETE   No orders of the defined types were placed in this encounter.   Patient Instructions  Medication Instructions:  Your physician recommends that you continue on your current medications as directed. Please refer to the Current Medication list given to you today.   *If you need a refill on your cardiac medications before your next appointment, please call your pharmacy*  Lab Work: No labs ordered today  If you have labs (blood work) drawn today and your tests are completely normal, you will receive your results only by: MyChart Message (if you have MyChart) OR A paper copy in the mail If you  have any lab test that is  abnormal or we need to change your treatment, we will call you to review the results.  Testing/Procedures: Your physician has requested that you have an echocardiogram. Echocardiography is a painless test that uses sound waves to create images of your heart. It provides your doctor with information about the size and shape of your heart and how well your heart's chambers and valves are working.   You may receive an ultrasound enhancing agent through an IV if needed to better visualize your heart during the echo. This procedure takes approximately one hour.  There are no restrictions for this procedure.  This will take place at [address] [facility] [address], [place] [address]  Please note: We ask at that you not bring children with you during ultrasound (echo/ vascular) testing. Due to room size and safety concerns, children are not allowed in the ultrasound rooms during exams. Our front office staff cannot provide observation of children in our lobby area while testing is being conducted. An adult accompanying a patient to their appointment will only be allowed in the ultrasound room at the discretion of the ultrasound technician under special circumstances. We apologize for any inconvenience.   Follow-Up: At [facility], you and your health needs are our priority.  As part of our continuing mission to provide you with exceptional heart care, our providers are all part of one team.  This team includes your primary Cardiologist (physician) and Advanced Practice Providers or APPs (Physician Assistants and Nurse Practitioners) who all work together to provide you with the care you need, when you need it.  Your next appointment:   5 month(s)  Provider:   You may see Dr. Darliss or one of the following Advanced Practice Providers on your designated Care Team:   Lonni Meager, NP Lesley Maffucci, PA-C Bernardino Bring, PA-C Cadence [place],  PA-C Tylene Lunch, NP Barnie Hila, NP    We recommend signing up for the patient portal called MyChart.  Sign up information is provided on this After Visit Summary.  MyChart is used to connect with patients for Virtual Visits (Telemedicine).  Patients are able to view lab/test results, encounter notes, upcoming appointments, etc.  Non-urgent messages can be sent to your provider as well.   To learn more about what you can do with MyChart, go to [email or web address].              Signed, Redell Darliss, MD  05/24/2024 3:56 PM    [facility]

## 2024-05-24 NOTE — Patient Instructions (Signed)
 Medication Instructions:  Your physician recommends that you continue on your current medications as directed. Please refer to the Current Medication list given to you today.   *If you need a refill on your cardiac medications before your next appointment, please call your pharmacy*  Lab Work: No labs ordered today  If you have labs (blood work) drawn today and your tests are completely normal, you will receive your results only by: MyChart Message (if you have MyChart) OR A paper copy in the mail If you have any lab test that is abnormal or we need to change your treatment, we will call you to review the results.  Testing/Procedures: Your physician has requested that you have an echocardiogram. Echocardiography is a painless test that uses sound waves to create images of your heart. It provides your doctor with information about the size and shape of your heart and how well your heart's chambers and valves are working.   You may receive an ultrasound enhancing agent through an IV if needed to better visualize your heart during the echo. This procedure takes approximately one hour.  There are no restrictions for this procedure.  This will take place at [address] [facility] [address], [place] [address]  Please note: We ask at that you not bring children with you during ultrasound (echo/ vascular) testing. Due to room size and safety concerns, children are not allowed in the ultrasound rooms during exams. Our front office staff cannot provide observation of children in our lobby area while testing is being conducted. An adult accompanying a patient to their appointment will only be allowed in the ultrasound room at the discretion of the ultrasound technician under special circumstances. We apologize for any inconvenience.   Follow-Up: At [facility], you and your health needs are our priority.  As part of our continuing mission to provide you with exceptional heart  care, our providers are all part of one team.  This team includes your primary Cardiologist (physician) and Advanced Practice Providers or APPs (Physician Assistants and Nurse Practitioners) who all work together to provide you with the care you need, when you need it.  Your next appointment:   5 month(s)  Provider:   You may see Dr. Darliss or one of the following Advanced Practice Providers on your designated Care Team:   Lonni Meager, NP Lesley Maffucci, PA-C Bernardino Bring, PA-C Cadence [place], PA-C Tylene Lunch, NP Barnie Hila, NP    We recommend signing up for the patient portal called MyChart.  Sign up information is provided on this After Visit Summary.  MyChart is used to connect with patients for Virtual Visits (Telemedicine).  Patients are able to view lab/test results, encounter notes, upcoming appointments, etc.  Non-urgent messages can be sent to your provider as well.   To learn more about what you can do with MyChart, go to [email or web address].

## 2024-06-06 ENCOUNTER — Ambulatory Visit: Admitting: Internal Medicine

## 2024-06-06 ENCOUNTER — Encounter: Payer: Self-pay | Admitting: Internal Medicine

## 2024-06-06 VITALS — BP 120/80 | HR 93 | Temp 98.5°F | Ht 62.0 in | Wt 175.0 lb

## 2024-06-06 DIAGNOSIS — Z9981 Dependence on supplemental oxygen: Secondary | ICD-10-CM

## 2024-06-06 DIAGNOSIS — J9611 Chronic respiratory failure with hypoxia: Secondary | ICD-10-CM | POA: Diagnosis not present

## 2024-06-06 DIAGNOSIS — D68311 Acquired hemophilia: Secondary | ICD-10-CM | POA: Insufficient documentation

## 2024-06-06 DIAGNOSIS — L97521 Non-pressure chronic ulcer of other part of left foot limited to breakdown of skin: Secondary | ICD-10-CM

## 2024-06-06 DIAGNOSIS — J441 Chronic obstructive pulmonary disease with (acute) exacerbation: Secondary | ICD-10-CM

## 2024-06-06 DIAGNOSIS — J440 Chronic obstructive pulmonary disease with acute lower respiratory infection: Secondary | ICD-10-CM

## 2024-06-06 DIAGNOSIS — U099 Post covid-19 condition, unspecified: Secondary | ICD-10-CM | POA: Diagnosis not present

## 2024-06-06 DIAGNOSIS — I480 Paroxysmal atrial fibrillation: Secondary | ICD-10-CM | POA: Insufficient documentation

## 2024-06-06 DIAGNOSIS — J411 Mucopurulent chronic bronchitis: Secondary | ICD-10-CM

## 2024-06-06 DIAGNOSIS — S069X9A Unspecified intracranial injury with loss of consciousness of unspecified duration, initial encounter: Secondary | ICD-10-CM

## 2024-06-06 MED ORDER — PREDNISONE 20 MG PO TABS: 20.0000 mg | ORAL_TABLET | Freq: Every day | ORAL | 1 refills | Status: DC

## 2024-06-06 MED ORDER — IPRATROPIUM-ALBUTEROL 0.5-2.5 (3) MG/3ML IN SOLN
3.0000 mL | Freq: Four times a day (QID) | RESPIRATORY_TRACT | Status: AC | PRN
  Administered 2024-06-06: 3 mL via RESPIRATORY_TRACT

## 2024-06-06 MED ORDER — ALBUTEROL SULFATE (2.5 MG/3ML) 0.083% IN NEBU: 2.5000 mg | INHALATION_SOLUTION | RESPIRATORY_TRACT | 2 refills | Status: AC | PRN

## 2024-06-06 MED ORDER — METHYLPREDNISOLONE ACETATE 80 MG/ML IJ SUSP
80.0000 mg | Freq: Once | INTRAMUSCULAR | Status: AC
  Administered 2024-06-06: 80 mg via INTRAMUSCULAR

## 2024-06-06 MED ORDER — AZITHROMYCIN 250 MG PO TABS: ORAL_TABLET | ORAL | 0 refills | Status: AC

## 2024-06-06 NOTE — Patient Instructions (Signed)
 We will plan for nebulizer treatment in office today We will give you a shot of steroids in your arm today  Start prednisone  20 mg daily for 10 days Start Z-Pak antibiotics  Continue oxygen as prescribed  Recommend using albuterol  nebulizers every 6 hours for the next 2 days  If symptoms do not improve you need to go to the emergency room  Avoid Allergens and Irritants Avoid secondhand smoke Avoid SICK contacts Recommend  Masking  when appropriate Recommend Keep up-to-date with vaccinations

## 2024-06-06 NOTE — Progress Notes (Signed)
 [facility] [place] Pulmonary Medicine Consultation      Date: 06/06/2024,   MRN# [id number] Carol Riley 12-10-1940  Synopsis 83 year old pleasant female seen today for assessment for Shortness of breath Past medical history consists of of DVT, previously on chronic Coumadin therapy, senile purpura, depression/anxiety, peripheral neuropathy.  Unfortunately had a fall last  year with intracranial hemorrhage. She was taken off anticoagulation at that time. She has had follow-up with neurosurgery, and reports that the recommendation was to continue to stay off blood thinners.  Patient diagnosed with DVT 10 years ago Was placed on Coumadin for the past 10 years Was taken off Coumadin due to her recurrent falls and intracranial hemorrhage,Was advised not to restart her therapy   TESTS SPIROMETRY: FVC was 1.48 liters, 60% of predicted FEV1 was 1.12, 60% of predicted FEV1 ratio was 75.47 FEF 25-75% liters per second was 64% of predicted  LUNG VOLUMES: TLC was 66% of predicted RV was 79% of predicted DIFFUSION CAPACITY: DLCO was 54% of predicted DLCO/VA was 73% of predicted FLOW VOLUME LOOP: Look more restrictive  Findings reviewed with patient in detail Findings suggest more restriction than obstructive lung disease   CHIEF COMPLAINT:   Follow up assessment of COPD, chronic bronchitis   HISTORY OF PRESENT ILLNESS   Chronic shortness of breath since November 2024 COVID infection Postviral bronchitis and cough Albuterol  responsive Her symptoms have resolving with Trelegy inhaler I have advised her to follow-up with pharmacy which medication she can afford to call us  so we can titrate accordingly  Severe exacerbation at this time Increased wheezing increased shortness of breath increased dyspnea on exertion Patient started feeling sick 2 weeks ago No fevers no chills No secondhand smoke exposure  Patient currently on oxygen therapy continuously  Plan for DuoNeb in the  office Plan for Depo-Medrol shot today Recommend antibiotics and prednisone  as outpatient Instructions to go to the ER if symptoms worsen    PAST MEDICAL HISTORY   Past Medical History:  Diagnosis Date   Arthritis    Cancer (HCC)    cervical cancer   Cataract cortical, senile    COPD (chronic obstructive pulmonary disease) (HCC)    Coronary artery disease    a. 06/2004 PCI: pLCX and RCA - Cypher DESs; b. 01/2012 Cath: patent stents->med rx; c. 04/2018 MV: mild ant ischemia; d. 01/2024 MV: EF 55-65%, no isch/scar.   Depression    DVT (deep venous thrombosis) (HCC)    a. prev on warfarin.   Edema    FEET/LEGS   Hyperglycemia    Hypertension    Intraventricular hemorrhage (HCC)    a. 03/2023 - in setting of syncope - managed conservatively - oac d/c.   Leukopenia    Moderate aortic stenosis    a. 04/2018 Echo: EF>55%, mild MR/TR/PR/AS; b. 07/2021 Echo: EF>55%, mod AS; c. 07/2022 Echo: EF>55%, mod AS, mild TR/PR; d. 03/2023 Echo: EF 60-65%, no rwma, mild LVH, GrI DD, nl RV fxn, mild MR, mod AS (mean/peak grad 21.7/37.7 mmHg); e. 12/2023 Echo: EF >55%, GrI DD, Mod AS (peak vel 3.4m/s) mod PR, triv AI/MR/TR.   Neuropathy    Peripheral neuropathy    PSVT (paroxysmal supraventricular tachycardia)    a. 03/2023 Zio: 56 SVT runs, fastest 226 x 9 beats, longest 24.9 secs @ 116 bpm-->bb increased.   Senile purpura    Sleep apnea    Syncope    Thrombocytopenia      SURGICAL HISTORY   Past Surgical History:  Procedure Laterality Date  BACK SURGERY     BREAST BIOPSY Left    benign needle   CHONDROPLASTY Left 05/13/2019   Procedure: CHONDROPLASTY;  Surgeon: Tobie Priest, MD;  Location: [facility] ORS;  Service: Orthopedics;  Laterality: Left;   COLONOSCOPY     COLONOSCOPY WITH PROPOFOL  N/A 02/24/2017   Procedure: COLONOSCOPY WITH PROPOFOL ;  Surgeon: Gaylyn Gladis PENNER, MD;  Location: [facility] ENDOSCOPY;  Service: Endoscopy;  Laterality: N/A;   CORONARY ANGIOPLASTY     STENT   EYE SURGERY     GAS  INSERTION Right 05/06/2015   Procedure: INSERTION OF GAS;  Surgeon: Harlene Scarce, MD;  Location: [facility] ORS;  Service: Ophthalmology;  Laterality: Right;   KNEE ARTHROSCOPY WITH MEDIAL MENISECTOMY Left 05/13/2019   Procedure: KNEE ARTHROSCOPY WITH PARTIAL  MEDIAL and lateral MENISECTOMY;  Surgeon: Tobie Priest, MD;  Location: [facility] ORS;  Service: Orthopedics;  Laterality: Left;   MEMBRANE PEEL Right 05/06/2015   Procedure: MEMBRANE PEEL;  Surgeon: Harlene Scarce, MD;  Location: [facility] ORS;  Service: Ophthalmology;  Laterality: Right;   PARS PLANA VITRECTOMY Right 05/06/2015   Procedure: PARS PLANA VITRECTOMY WITH 23 GAUGE;  Surgeon: Harlene Scarce, MD;  Location: [facility] ORS;  Service: Ophthalmology;  Laterality: Right;     FAMILY HISTORY   Family History  Problem Relation Age of Onset   Cancer Mother    Breast cancer Sister 46     SOCIAL HISTORY   Social History   Tobacco Use   Smoking status: Former    Current packs/day: 0.00    Types: Cigarettes    Quit date: 08/29/1998    Years since quitting: 25.7   Smokeless tobacco: Never   Tobacco comments:    Started smoking at age 68, smoked 1 pack a day.  quit in 2000.   Vaping Use   Vaping status: Never Used  Substance Use Topics   Alcohol use: No   Drug use: No     MEDICATIONS    Home Medication:  Current Outpatient Rx   Order #: [id number] Class: Normal   Order #: [id number] Class: Historical Med   Order #: [id number] Class: Normal   Order #: [id number] Class: Historical Med   Order #: [id number] Class: Historical Med   Order #: [id number] Class: Historical Med   Order #: [id number] Class: Historical Med   Order #: [id number] Class: Normal   Order #: [id number] Class: Sample   Order #: [id number] Class: Normal   Order #: [id number] Class: Historical Med   Order #: [id number] Class: Historical Med   Order #: [id number] Class: Historical Med   Order #: [id number] Class: Normal   Order #: [id number] Class: Historical Med   Order #: [id number] Class:  Historical Med   Order #: [id number] Class: Historical Med   Order #: [id number] Class: Normal   Order #: [id number] Class: Historical Med    Current Medication:  Current Outpatient Medications:    albuterol  (VENTOLIN  HFA) 108 (90 Base) MCG/ACT inhaler, Inhale 2 puffs into the lungs every 4 (four) hours as needed., Disp: 18 g, Rfl: 0   aspirin EC 81 MG tablet, Take 81 mg by mouth daily. Swallow whole., Disp: , Rfl:    benzonatate  (TESSALON ) 100 MG capsule, Take 2 capsules (200 mg total) by mouth every 8 (eight) hours., Disp: 21 capsule, Rfl: 0   chlorthalidone (HYGROTON) 25 MG tablet, Take 25 mg by mouth daily., Disp: , Rfl:    Cholecalciferol (VITAMIN D3 PO), Take 1 tablet by mouth daily., Disp: , Rfl:    citalopram (CELEXA) 20 MG tablet, Take 20 mg by mouth daily., Disp: , Rfl:  clobetasol ointment (TEMOVATE) 0.05 %, Apply 1 Application topically as directed.  APPLY FINGERTIP SIZE AMOUNT TO AFFECTED AREA 2-3 TIMES WEEKLY FOR MAINTENANCE, Disp: , Rfl:    Fluticasone -Umeclidin-Vilant (TRELEGY ELLIPTA ) 200-62.5-25 MCG/ACT AEPB, Inhale 1 puff into the lungs daily in the afternoon., Disp: 60 each, Rfl: 11   Fluticasone -Umeclidin-Vilant (TRELEGY ELLIPTA ) 200-62.5-25 MCG/ACT AEPB, Inhale 1 puff into the lungs daily at 6 (six) AM. (Patient not taking: Reported on 05/24/2024), Disp: , Rfl:    furosemide  (LASIX ) 20 MG tablet, TAKE 1 TABLET BY MOUTH EVERY DAY AS NEEDED, Disp: 90 tablet, Rfl: 1   gabapentin (NEURONTIN) 300 MG capsule, Take 1 capsule by mouth 2 (two) times daily., Disp: , Rfl:    hydrocortisone  cream 1 %, Apply 1 application topically daily as needed for itching., Disp: , Rfl:    lisinopril  (ZESTRIL ) 40 MG tablet, Take 40 mg by mouth daily., Disp: , Rfl:    metoprolol  succinate (TOPROL -XL) 25 MG 24 hr tablet, Take 1 tablet (25 mg total) by mouth daily., Disp: 90 tablet, Rfl: 0   Multiple Vitamin (MULTIVITAMIN) tablet, Take 1 tablet by mouth daily., Disp: , Rfl:    OXYGEN, Inhale 2 L into the  lungs at bedtime., Disp: , Rfl:    simvastatin  (ZOCOR ) 20 MG tablet, Take 20 mg by mouth at bedtime. , Disp: , Rfl:    Spacer/Aero-Holding Chambers (AEROCHAMBER MV) inhaler, Use as instructed, Disp: 1 each, Rfl: 2   triamcinolone cream (KENALOG) 0.5 %, Apply topically., Disp: , Rfl:     ALLERGIES   Pneumovax [pneumococcal polysaccharide vaccine] and Tizanidine  Pulse 93   Ht 5' 2 (1.575 m)   Wt 175 lb (79.4 kg)   SpO2 91% Comment: on 2L of O2  BMI 32.01 kg/m   Review of Systems: Gen:  Denies  fever, sweats, chills weight loss  HEENT: Denies blurred vision, double vision, ear pain, eye pain, hearing loss, nose bleeds, sore throat Cardiac:  No dizziness, chest pain or heaviness, chest tightness,edema, No JVD Resp:   +cough, +sputum production, +shortness of breath,+wheezing, -hemoptysis,  Other:  All other systems negative   Physical Examination:   General Appearance: +minimal  distress  EYES PERRLA, EOM intact.   NECK Supple, No JVD Pulmonary: normal breath sounds, + wheezing.  CardiovascularNormal S1,S2.  No m/r/g.   Abdomen: Benign, Soft, non-tender. Neurology UE/LE 5/5 strength, no focal deficits Ext pulses intact, cap refill intact ALL OTHER ROS ARE NEGATIVE    ASSESSMENT/PLAN   83 year old pleasant white female seen today for follow-up assessment for shortness of breath dyspnea on exertion likely related to chronic bronchitis post COVID infection with underlying deconditioned state and obesity with a history of restrictive lung disease intermittent reactive airways disease in the setting of chronic hypoxic respiratory failure on oxygen therapy, currently with acute COPD exacerbation  Acute COPD exacerbation DuoNeb in office Depo-Medrol shot Prednisone  20 mg daily for 10 days Z-Pak Continue Trelegy Use incentive spirometry 10-15 times per day Avoid Allergens and Irritants Avoid secondhand smoke Avoid SICK contacts Recommend  Masking  when  appropriate Recommend Keep up-to-date with vaccinations   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 -Patient benefits from oxygen therapy 2L [place]  -recommend using oxygen as prescribed -patient needs this for survival    Follow-up cardiology follow-up neurology as scheduled    MEDICATION ADJUSTMENTS/LABS AND TESTS ORDERED: Trelegy inhaler 200 puff daily albuterol  as needed Prednisone  20 mg daily for 10 days Z-Pak Recommend incentive spirometry which is her breathing to avoid  10-15 times per day-This helps to prevent pneumonia Avoid Allergens and Irritants Avoid secondhand smoke Avoid SICK contacts Recommend  Masking  when appropriate Recommend Keep up-to-date with vaccinations Recommend ER visitation if symptoms worsen  CURRENT MEDICATIONS REVIEWED AT LENGTH WITH PATIENT TODAY   Patient  satisfied with Plan of action and management. All questions answered   Follow up 4 weeks   I spent a total of 52 minutes dedicated to the care of this patient on the date of this encounter to include pre-visit review of records, face-to-face time with the patient discussing conditions above, post visit ordering of testing, clinical documentation with the electronic health record, making appropriate referrals as documented, and communicating necessary information to the patient's healthcare team.    The Patient requires high complexity decision making for assessment and support, frequent evaluation and titration of therapies, application of advanced monitoring technologies and extensive interpretation of multiple databases.  Patient satisfied with Plan of action and management. All questions answered    Nickolas Alm Cellar, M.D.  [facility] Pulmonary [facility] Critical Care Medicine  Medical Director [facility] [place]

## 2024-06-17 DIAGNOSIS — J432 Centrilobular emphysema: Secondary | ICD-10-CM | POA: Diagnosis not present

## 2024-07-08 ENCOUNTER — Ambulatory Visit: Admitting: Internal Medicine

## 2024-07-17 ENCOUNTER — Ambulatory Visit

## 2024-07-18 DIAGNOSIS — J432 Centrilobular emphysema: Secondary | ICD-10-CM | POA: Diagnosis not present

## 2024-08-08 ENCOUNTER — Ambulatory Visit

## 2024-08-08 VITALS — BP 138/80 | HR 78 | Ht 62.0 in | Wt 173.6 lb

## 2024-08-08 DIAGNOSIS — I350 Nonrheumatic aortic (valve) stenosis: Secondary | ICD-10-CM | POA: Diagnosis not present

## 2024-08-08 DIAGNOSIS — I739 Peripheral vascular disease, unspecified: Secondary | ICD-10-CM | POA: Diagnosis not present

## 2024-08-08 DIAGNOSIS — U099 Post covid-19 condition, unspecified: Secondary | ICD-10-CM | POA: Diagnosis not present

## 2024-08-08 DIAGNOSIS — J411 Mucopurulent chronic bronchitis: Secondary | ICD-10-CM | POA: Diagnosis not present

## 2024-08-08 DIAGNOSIS — I2510 Atherosclerotic heart disease of native coronary artery without angina pectoris: Secondary | ICD-10-CM | POA: Diagnosis not present

## 2024-08-08 DIAGNOSIS — Z9981 Dependence on supplemental oxygen: Secondary | ICD-10-CM | POA: Insufficient documentation

## 2024-08-08 DIAGNOSIS — J9611 Chronic respiratory failure with hypoxia: Secondary | ICD-10-CM

## 2024-08-08 DIAGNOSIS — J449 Chronic obstructive pulmonary disease, unspecified: Secondary | ICD-10-CM | POA: Insufficient documentation

## 2024-08-08 DIAGNOSIS — R430 Anosmia: Secondary | ICD-10-CM | POA: Insufficient documentation

## 2024-08-08 DIAGNOSIS — Z23 Encounter for immunization: Secondary | ICD-10-CM | POA: Diagnosis not present

## 2024-08-08 DIAGNOSIS — I509 Heart failure, unspecified: Secondary | ICD-10-CM | POA: Diagnosis not present

## 2024-08-08 DIAGNOSIS — R7309 Other abnormal glucose: Secondary | ICD-10-CM | POA: Diagnosis not present

## 2024-08-08 MED ORDER — ALBUTEROL SULFATE HFA 108 (90 BASE) MCG/ACT IN AERS: 2.0000 | INHALATION_SPRAY | RESPIRATORY_TRACT | 0 refills | Status: AC | PRN

## 2024-08-08 NOTE — Progress Notes (Signed)
 New Patient Visit   Physician: Tanisha Lutes A Jaelynn Currier, MD  Patient: Carol Riley   DOB: [birth year]   83 y.o. Female  MRN: [id number] Visit Date: 08/08/2024   Chief Complaint  Patient presents with   Establish Care   Subjective  Carol Riley is a 83 y.o. female who presents today as a new patient to establish care.   HPI  Discussed the use of AI scribe software for clinical note transcription with the patient, who gave verbal consent to proceed.  History of Present Illness   Carol Riley is an 83 year old female with COPD and coronary artery disease who presents for follow-up care.  Dyspnea and chronic obstructive pulmonary disease (copd) symptoms - Shortness of breath, particularly with exertion such as walking to the mailbox and back - COPD attributed to a prior 25-year smoking history; quit 20 years ago - Uses nebulizer and Trelegy for management - Home oxygen available, but used less frequently now - No hospitalizations for COPD exacerbations, but has had episodes requiring outpatient care  - Follows with pulmonology  Cardiovascular disease and heart failure - History of coronary artery disease with two stents placed over 10 years ago - s/p PCI/DES to LCx, RCA 2005, (LHC 2013 patent stents),  - History of heart failure and aortic stenosis - Lexiscan  Myoview  6/25 low risk study, no significant ischemia. Echo 03/2023 EF 60 to 65%, impaired relaxation, aortic valve mean gradient 21.7 mmHg, V-max 3.1 m/s - No recent lower extremity edema - Manages diet with reduced salt intake - On aspirin, simvastatin , and Toprol  XL  Neurological sequelae post-intracranial hemorrhage - History of intracranial hemorrhage following a fall in October of previous year, requiring ICU admission - No residual weakness or difficulty with ambulation - Lives independently and utilizes a lifeline alert system  Peripheral vascular disease - Leg numbness and itching - Manages symptoms with  vitamin B complex and moisturizers  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   Chronic respiratory failure with hypoxia, on home oxygen therapy (HCC) Yes   Chronic obstructive pulmonary disease, unspecified COPD type (HCC)    Mucopurulent chronic bronchitis (HCC)    Chronic congestive heart failure, unspecified heart failure type (HCC)    Coronary artery disease involving native heart without angina pectoris, unspecified vessel or lesion type    Flu vaccine need     Orders Placed This Encounter  Procedures   Flu vaccine HIGH DOSE PF(Fluzone Trivalent)   Brain natriuretic peptide   CBC with Differential/Platelet   Comprehensive metabolic panel with GFR   Hemoglobin A1c   Lipid panel   Urinalysis, Routine w reflex microscopic    Assessment and Plan    Chronic obstructive pulmonary disease with chronic respiratory failure and hypoxia COPD with chronic respiratory failure and hypoxia, stable with nebulizer and Trelegy. Oxygen available but infrequently used. - Ensure availability of albuterol  inhaler for rescue use. - Refilled albuterol  prescription.  She has adequate  nebulizer soln - Continue current nebulizer and Trelegy regimen.  - F/u immediately if URTI  Coronary artery disease with prior stent placement Coronary artery disease with two stents, managed with aspirin and simvastatin . Echocardiogram scheduled.  Ongoing  cardiology follow up - Continue aspirin and simvastatin . - Will repeat echocardiogram as scheduled.  Aortic valve stenosis No recent symptoms.  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 Vitamin B complex helps itching. Dry skin likely contributing. - Continue vitamin B complex. - Moisturize skin regularly.  Urinary incontinence Exacerbated by COPD-related coughing. Managed with pads.  General health maintenance Due for flu shot and blood work. No recent flu vaccination. Blood work overdue. - Administered flu shot today. - Ordered blood work.      SH negative - Has two sons both involved in her care.    Objective  BP 138/80   Pulse 78   Ht 5' 2 (1.575 m)   Wt 173 lb 9.6 oz (78.7 kg)   SpO2 98%   BMI 31.75 kg/m      Review of Systems  Constitutional:  Negative for chills, fever and weight loss.  Eyes:  Negative for blurred vision. h Respiratory:  Negative for cough and shortness of breath.   Cardiovascular:  Negative for chest pain and palpitations.  Skin:  Negative for rash.  Psychiatric/Behavioral:  Negative for depression. The patient is not nervous/anxious.      Physical Exam Physical Exam Vitals reviewed.  Constitutional:      Appearance: Normal appearance. Well-developed with normal weight.  HENT:     Head: Normocephalic and atraumatic.  Normal mucous membranes, no oral lesions Eyes:     Pupils: Pupils are equal, round, and reactive to light.  Neck:     Thyroid: No thyroid mass or thyromegaly.  Cardiovascular:     Rate and Rhythm: Normal rate and regular rhythm. Grade IV systolic murmur RUSB/LUSB.  Reduced peripheral pulses, venous insufficiency Pulmonary:     Normal breath sounds with normal effort Abdominal:   Abdomen is soft, without tenderness or noted hepatosplenomegaly Musculoskeletal:        General: No swelling or edema  Lymphadenopathy:      Cervical: No cervical adenopathy.  Skin:    General: Skin is warm and dry without noticeable rash. Neurological:     General: No focal deficit present.  Psychiatric:        Mood and Affect: Mood, behavior and cognition normal   Past Medical History:  Diagnosis Date   Arthritis    Cancer (HCC)    cervical cancer   Cataract cortical, senile    COPD (chronic obstructive pulmonary disease) (HCC)    Coronary artery disease    a. 06/2004 PCI: pLCX and RCA - Cypher DESs; b. 01/2012 Cath: patent stents->med rx; c. 04/2018 MV: mild ant ischemia; d. 01/2024 MV: EF 55-65%, no isch/scar.   Depression    DVT (deep venous thrombosis) (HCC)    a. prev on warfarin.   Edema    FEET/LEGS   Hyperglycemia    Hypertension    Intraventricular hemorrhage (HCC)    a. 03/2023 - in setting of syncope - managed conservatively - oac d/c.   Leukopenia    Moderate aortic stenosis    a. 04/2018 Echo: EF>55%, mild MR/TR/PR/AS; b. 07/2021 Echo: EF>55%, mod AS; c. 07/2022 Echo: EF>55%, mod AS, mild TR/PR; d. 03/2023 Echo: EF 60-65%, no rwma, mild LVH, GrI DD, nl RV fxn, mild  MR, mod AS (mean/peak grad 21.7/37.7 mmHg); e. 12/2023 Echo: EF >55%, GrI DD, Mod AS (peak vel 3.61m/s) mod PR, triv AI/MR/TR.   Neuropathy    Peripheral neuropathy    PSVT (paroxysmal supraventricular tachycardia)    a. 03/2023 Zio: 56 SVT runs, fastest 226 x 9 beats, longest 24.9 secs @ 116 bpm-->bb increased.   Senile purpura    Sleep apnea    Syncope    Thrombocytopenia    Past Surgical History:  Procedure Laterality Date   BACK SURGERY     BREAST BIOPSY Left    benign needle   CHONDROPLASTY Left 05/13/2019   Procedure: CHONDROPLASTY;  Surgeon: Tobie Priest, MD;  Location: [facility] ORS;  Service: Orthopedics;  Laterality: Left;   COLONOSCOPY     COLONOSCOPY WITH PROPOFOL  N/A 02/24/2017   Procedure: COLONOSCOPY WITH PROPOFOL ;  Surgeon: Gaylyn Gladis PENNER, MD;  Location: [facility] ENDOSCOPY;  Service: Endoscopy;  Laterality: N/A;   CORONARY  ANGIOPLASTY     STENT   EYE SURGERY     GAS INSERTION Right 05/06/2015   Procedure: INSERTION OF GAS;  Surgeon: Harlene Scarce, MD;  Location: [facility] ORS;  Service: Ophthalmology;  Laterality: Right;   KNEE ARTHROSCOPY WITH MEDIAL MENISECTOMY Left 05/13/2019   Procedure: KNEE ARTHROSCOPY WITH PARTIAL  MEDIAL and lateral MENISECTOMY;  Surgeon: Tobie Priest, MD;  Location: [facility] ORS;  Service: Orthopedics;  Laterality: Left;   MEMBRANE PEEL Right 05/06/2015   Procedure: MEMBRANE PEEL;  Surgeon: Harlene Scarce, MD;  Location: [facility] ORS;  Service: Ophthalmology;  Laterality: Right;   PARS PLANA VITRECTOMY Right 05/06/2015   Procedure: PARS PLANA VITRECTOMY WITH 23 GAUGE;  Surgeon: Harlene Scarce, MD;  Location: [facility] ORS;  Service: Ophthalmology;  Laterality: Right;   Family Status  Relation Name Status   Mother  Deceased   Sister  Deceased  No partnership data on file   Family History  Problem Relation Age of Onset   Cancer Mother    Breast cancer Sister 60   Social History   Socioeconomic History   Marital status: Divorced    Spouse name: Not on file   Number of children: Not on file   Years of education: Not on file   Highest education level: Not on file  Occupational History   Not on file  Tobacco Use   Smoking status: Former    Current packs/day: 0.00    Types: Cigarettes    Quit date: 08/29/1998    Years since quitting: 25.9   Smokeless tobacco: Never   Tobacco comments:    Started smoking at age 57, smoked 1 pack a day.  quit in 2000.   Vaping Use   Vaping status: Never Used  Substance and Sexual Activity   Alcohol use: No   Drug use: No   Sexual activity: Not on file  Other Topics Concern   Not on file  Social History Narrative   Not on file   Social Drivers of Health   Tobacco Use: Medium Risk (06/06/2024)   Patient History    Smoking Tobacco Use: Former    Smokeless Tobacco Use: Never    Passive Exposure: Not on file  Financial Resource Strain: Not on file   Food Insecurity: Food Insecurity Present (04/05/2023)   Hunger Vital Sign    Worried About Running Out of Food in the Last Year: Sometimes true    Ran Out of Food in the Last Year: Sometimes true  Transportation Needs: No Transportation Needs (04/05/2023)   PRAPARE -  Administrator, Civil Service (Medical): No    Lack of Transportation (Non-Medical): No  Physical Activity: Not on file  Stress: Not on file  Social Connections: Not on file  Depression (EYV7-0): Not on file  Alcohol Screen: Not on file  Housing: Unknown (01/02/2024)   Received [facility]   Epic    Unable to Pay for Housing in the Last Year: Not on file    Number of Times Moved in the Last Year: Not on file    At any time in the past 12 months, were you homeless or living in a shelter (including now)?: No  Utilities: Not At Risk (04/05/2023)   [facility] Utilities    Threatened with loss of utilities: No  Health Literacy: Not on file   Show/hide medication list[1] Allergies[2]  Immunization History  Administered Date(s) Administered   Fluzone Influenza virus vaccine,trivalent (IIV3), split virus 06/02/2014   INFLUENZA, HIGH DOSE SEASONAL PF 05/10/2018, 08/08/2024   Influenza Inj Mdck Quad Pf 09/09/2021, 07/05/2022   Influenza, Mdck, Trivalent,PF 6+ MOS(egg free) 07/12/2023   Influenza-Unspecified 07/09/2013, 06/15/2015, 06/20/2016, 04/29/2020   PFIZER Comirnaty(Gray Top)Covid-19 Tri-Sucrose Vaccine 10/16/2019, 11/06/2019, 08/28/2020   Pneumococcal Conjugate-13 11/10/2015   Pneumococcal Polysaccharide-23 12/23/2008   Td 07/29/2019   Tdap 12/23/2008   Zoster, Live 05/03/2011    Health Maintenance  Topic Date Due   Medicare Annual Wellness (AWV)  Never done   Zoster Vaccines- Shingrix (1 of 2) 04/26/1958   Bone Density Scan  Never done   COVID-19 Vaccine (4 - 2025-26 season) 04/29/2024   DTaP/Tdap/Td (3 - Td or Tdap) 07/28/2029   Pneumococcal Vaccine: 50+ Years  Completed   Influenza  Vaccine  Completed   Meningococcal B Vaccine  Aged Out    Patient Care Team: Everlene Parris LABOR, MD as PCP - General (Family Medicine) Darliss Rogue, MD as PCP - Cardiology (Cardiology)  Depression Screen     No data to display           Parris LABOR Everlene, MD  [facility] [phone] (phone) [phone] (fax)  [facility]    [1]  Outpatient Medications Prior to Visit  Medication Sig   aspirin EC 81 MG tablet Take 81 mg by mouth daily. Swallow whole.   benzonatate  (TESSALON ) 100 MG capsule Take 2 capsules (200 mg total) by mouth every 8 (eight) hours.   chlorthalidone (HYGROTON) 25 MG tablet Take 25 mg by mouth daily.   Cholecalciferol (VITAMIN D3 PO) Take 1 tablet by mouth daily.   Fluticasone -Umeclidin-Vilant (TRELEGY ELLIPTA ) 200-62.5-25 MCG/ACT AEPB Inhale 1 puff into the lungs daily in the afternoon.   gabapentin (NEURONTIN) 300 MG capsule Take 300 mg by mouth 2 (two) times daily.   hydrocortisone  cream 1 % Apply 1 application topically daily as needed for itching.   metoprolol  succinate (TOPROL -XL) 25 MG 24 hr tablet Take 1 tablet (25 mg total) by mouth daily.   Multiple Vitamin (MULTIVITAMIN) tablet Take 1 tablet by mouth daily.   OXYGEN Inhale 2 L into the lungs at bedtime.   simvastatin  (ZOCOR ) 20 MG tablet Take 20 mg by mouth at bedtime.    simvastatin  (ZOCOR ) 20 MG tablet Take 20 mg by mouth at bedtime.   Spacer/Aero-Holding Chambers (AEROCHAMBER MV) inhaler Use as instructed   [DISCONTINUED] albuterol  (VENTOLIN  HFA) 108 (90 Base) MCG/ACT inhaler Inhale 2 puffs into the lungs every 4 (four) hours as needed.   albuterol  (PROVENTIL ) (2.5 MG/3ML) 0.083%  nebulizer solution Take 3 mLs (2.5 mg total) by nebulization every 4 (four) hours as needed for wheezing or shortness of breath. (Patient not taking: Reported on 08/08/2024)   citalopram (CELEXA) 20 MG tablet Take 20 mg by mouth daily. (Patient not taking: Reported on  08/08/2024)   clobetasol ointment (TEMOVATE) 0.05 % Apply 1 Application topically as directed.  APPLY FINGERTIP SIZE AMOUNT TO AFFECTED AREA 2-3 TIMES WEEKLY FOR MAINTENANCE (Patient not taking: Reported on 08/08/2024)   Fluticasone -Umeclidin-Vilant (TRELEGY ELLIPTA ) 200-62.5-25 MCG/ACT AEPB Inhale 1 puff into the lungs daily at 6 (six) AM. (Patient not taking: Reported on 06/06/2024)   furosemide  (LASIX ) 20 MG tablet TAKE 1 TABLET BY MOUTH EVERY DAY AS NEEDED (Patient not taking: Reported on 08/08/2024)   predniSONE  (DELTASONE ) 20 MG tablet Take 1 tablet (20 mg total) by mouth daily with breakfast. 10 days (Patient not taking: Reported on 08/08/2024)   triamcinolone cream (KENALOG) 0.5 % Apply topically. (Patient not taking: Reported on 08/08/2024)   [DISCONTINUED] lisinopril  (ZESTRIL ) 40 MG tablet Take 40 mg by mouth daily. (Patient not taking: Reported on 08/08/2024)   Facility-Administered Medications Prior to Visit  Medication Dose Route Frequency Provider   ipratropium-albuterol  (DUONEB) 0.5-2.5 (3) MG/3ML nebulizer solution 3 mL  3 mL Nebulization Q6H PRN Kasa, Kurian, MD  [2]  Allergies Allergen Reactions   Pneumovax [Pneumococcal Polysaccharide Vaccine] Swelling    Redness   Tizanidine Other (See Comments)    Caused low blood pressure

## 2024-08-09 LAB — COMPREHENSIVE METABOLIC PANEL WITH GFR
AG Ratio: 1.6 (calc) (ref 1.0–2.5)
ALT: 17 U/L (ref 6–29)
AST: 21 U/L (ref 10–35)
Albumin: 4.5 g/dL (ref 3.6–5.1)
Alkaline phosphatase (APISO): 53 U/L (ref 37–153)
BUN/Creatinine Ratio: 22 (calc) (ref 6–22)
BUN: 22 mg/dL (ref 7–25)
CO2: 34 mmol/L — ABNORMAL HIGH (ref 20–32)
Calcium: 10.5 mg/dL — ABNORMAL HIGH (ref 8.6–10.4)
Chloride: 93 mmol/L — ABNORMAL LOW (ref 98–110)
Creat: 0.99 mg/dL — ABNORMAL HIGH (ref 0.60–0.95)
Globulin: 2.9 g/dL (ref 1.9–3.7)
Glucose, Bld: 89 mg/dL (ref 65–99)
Potassium: 4 mmol/L (ref 3.5–5.3)
Sodium: 139 mmol/L (ref 135–146)
Total Bilirubin: 0.5 mg/dL (ref 0.2–1.2)
Total Protein: 7.4 g/dL (ref 6.1–8.1)
eGFR: 56 mL/min/1.73m2 — ABNORMAL LOW (ref >=60)

## 2024-08-09 LAB — CBC WITH DIFFERENTIAL/PLATELET
Absolute Lymphocytes: 1911 {cells}/uL (ref 850–3900)
Absolute Monocytes: 663 {cells}/uL (ref 200–950)
Basophils Absolute: 33 {cells}/uL (ref 0–200)
Basophils Relative: 0.5 %
Eosinophils Absolute: 221 {cells}/uL (ref 15–500)
Eosinophils Relative: 3.4 %
HCT: 48.5 % — ABNORMAL HIGH (ref 35.9–46.0)
Hemoglobin: 16.5 g/dL — ABNORMAL HIGH (ref 11.7–15.5)
MCH: 32.4 pg (ref 27.0–33.0)
MCHC: 34 g/dL (ref 31.6–35.4)
MCV: 95.3 fL (ref 81.4–101.7)
MPV: 10.7 fL (ref 7.5–12.5)
Monocytes Relative: 10.2 %
Neutro Abs: 3673 {cells}/uL (ref 1500–7800)
Neutrophils Relative %: 56.5 %
Platelets: 164 Thousand/uL (ref 140–400)
RBC: 5.09 Million/uL (ref 3.80–5.10)
RDW: 12.6 % (ref 11.0–15.0)
Total Lymphocyte: 29.4 %
WBC: 6.5 Thousand/uL (ref 3.8–10.8)

## 2024-08-09 LAB — URINALYSIS, ROUTINE W REFLEX MICROSCOPIC
Bilirubin Urine: NEGATIVE
Glucose, UA: NEGATIVE
Hgb urine dipstick: NEGATIVE
Ketones, ur: NEGATIVE
Leukocytes,Ua: NEGATIVE
Nitrite: NEGATIVE
Protein, ur: NEGATIVE
Specific Gravity, Urine: 1.012 (ref 1.001–1.035)
pH: 8 (ref 5.0–8.0)

## 2024-08-09 LAB — LIPID PANEL
Cholesterol: 178 mg/dL (ref <200)
HDL: 71 mg/dL (ref >=50)
LDL Cholesterol (Calc): 77 mg/dL
Non-HDL Cholesterol (Calc): 107 mg/dL (ref <130)
Total CHOL/HDL Ratio: 2.5 (calc) (ref <5.0)
Triglycerides: 198 mg/dL — ABNORMAL HIGH (ref <150)

## 2024-08-09 LAB — HEMOGLOBIN A1C
Hgb A1c MFr Bld: 6 % — ABNORMAL HIGH (ref <5.7)
Mean Plasma Glucose: 126 mg/dL
eAG (mmol/L): 7 mmol/L

## 2024-08-17 DIAGNOSIS — J432 Centrilobular emphysema: Secondary | ICD-10-CM | POA: Diagnosis not present

## 2024-08-23 ENCOUNTER — Ambulatory Visit: Attending: Cardiology

## 2024-08-23 DIAGNOSIS — I2510 Atherosclerotic heart disease of native coronary artery without angina pectoris: Secondary | ICD-10-CM | POA: Diagnosis not present

## 2024-08-23 DIAGNOSIS — I350 Nonrheumatic aortic (valve) stenosis: Secondary | ICD-10-CM

## 2024-08-23 LAB — ECHOCARDIOGRAM COMPLETE
AR max vel: 0.71 cm2
AV Area VTI: 0.8 cm2
AV Area mean vel: 0.74 cm2
AV Mean grad: 35 mmHg
AV Peak grad: 60.5 mmHg
Ao pk vel: 3.89 m/s
Area-P 1/2: 2.79 cm2
S' Lateral: 2.6 cm

## 2024-08-25 ENCOUNTER — Ambulatory Visit: Payer: Self-pay | Admitting: Cardiology

## 2024-09-06 ENCOUNTER — Ambulatory Visit

## 2024-09-06 VITALS — BP 128/74 | HR 78 | Ht 62.0 in | Wt 173.8 lb

## 2024-09-06 DIAGNOSIS — I872 Venous insufficiency (chronic) (peripheral): Secondary | ICD-10-CM | POA: Diagnosis not present

## 2024-09-06 DIAGNOSIS — F32A Depression, unspecified: Secondary | ICD-10-CM | POA: Insufficient documentation

## 2024-09-06 DIAGNOSIS — J449 Chronic obstructive pulmonary disease, unspecified: Secondary | ICD-10-CM

## 2024-09-06 DIAGNOSIS — J441 Chronic obstructive pulmonary disease with (acute) exacerbation: Secondary | ICD-10-CM | POA: Diagnosis not present

## 2024-09-06 MED ORDER — PREDNISONE 20 MG PO TABS: 40.0000 mg | ORAL_TABLET | Freq: Every day | ORAL | 0 refills | Status: AC

## 2024-09-06 NOTE — Progress Notes (Signed)
 "             Progress Note  Physician: Parris DELENA Juneau, MD   HPI: Carol Riley is a 84 y.o. female presenting on 09/06/2024 for Follow-up .  Discussed the use of AI scribe software for clinical note transcription with the patient, who gave verbal consent to proceed.  History of Present Illness   Carol Riley is an 84 year old female with COPD who presents with increased fatigue and cough.  Respiratory symptoms - Increased fatigue over the past couple of weeks, attributed to age and COPD - Breathing remains stable without worsening - Productive cough with white sputum for the last couple of weeks - No fever or chills - Received influenza vaccination at last visit - Uses rescue inhaler and nebulizer, but not as frequently as recommended - Nebulizer provides symptomatic relief  Psychosocial factors - Lives alone and relies on her son, who is her power of attorney, for heavy work at home - Finds reliance on her son stressful - Supportive church community - Engages in activities such as crossword puzzles and Sudoku - Has pets, including an elderly dog and cat  Medication use - Takes citalopram, which is effective but has external stressors that make her feel depressed at times.      Medical history:  Relevant past medical, surgical, family and social history reviewed and updated as indicated. Interim medical history since our last visit reviewed.  Allergies and medications reviewed and updated.   ROS: Negative unless specifically indicated above in HPI.   Current Medications[1]      + Objective:     BP 128/74 (BP Location: Left Arm, Patient Position: Sitting, Cuff Size: Normal)   Pulse 78   Ht 5' 2 (1.575 m)   Wt 173 lb 12.8 oz (78.8 kg)   SpO2 92%   BMI 31.79 kg/m   Wt Readings from Last 3 Encounters:  09/06/24 173 lb 12.8 oz (78.8 kg)  08/08/24 173 lb 9.6 oz (78.7 kg)  06/06/24 175 lb (79.4 kg)    Physical Exam  Physical Exam Vitals  reviewed.  Constitutional:      Appearance: Normal appearance. Well-developed with normal weight.  Cardiovascular:     Rate and Rhythm: Normal rate and regular rhythm. Normal heart sounds. Normal peripheral pulses Pulmonary:     Bilateral few rhonchi in lung fields.  SaO2 97% Skin:    General: Skin is warm and dry without noticeable rash.  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  Psychiatric:        Mood and Affect: Mood, behavior and cognition normal      Assessment & Plan:   Encounter Diagnoses  Name Primary?   Chronic obstructive pulmonary disease, unspecified COPD type (HCC) Yes   Depression, unspecified depression type    Venous insufficiency     No orders of the defined types were placed in this encounter.    Assessment and Plan  #1 COPD with acute exacerbation.  She does have productive white sputum but no signs of secondary infection at this point some mild increased work of breathing noted today we will prescribe prednisone  40 mg for 5 days.  Increase nebulizer use to 3 times a day she should continue Trelegy otherwise monitor closely for worsening shortness of breath, purulent sputum consider antibiotics if necessary.  Follow-up appointment for next week  2.  Depression.  Exacerbated by external stressors including family  issues and age related loneliness.  Continue citalopram.  She is involved in her church.  3.  Chronic venous insufficiency.  Difficulty applying compression stockings elevate legs when sitting if possible.  Labs reviewed with patient overall renal and liver function normal.  We will continue to monitor                 +++    [1]  Current Outpatient Medications:    albuterol  (PROVENTIL ) (2.5 MG/3ML) 0.083% nebulizer solution, Take 3 mLs (2.5 mg total) by nebulization every 4 (four) hours as needed for wheezing or shortness of breath., Disp: 75 mL, Rfl: 2   albuterol   (VENTOLIN  HFA) 108 (90 Base) MCG/ACT inhaler, Inhale 2 puffs into the lungs every 4 (four) hours as needed., Disp: 18 g, Rfl: 0   aspirin EC 81 MG tablet, Take 81 mg by mouth daily. Swallow whole., Disp: , Rfl:    chlorthalidone (HYGROTON) 25 MG tablet, Take 25 mg by mouth daily., Disp: , Rfl:    Cholecalciferol (VITAMIN D3 PO), Take 1 tablet by mouth daily., Disp: , Rfl:    citalopram (CELEXA) 20 MG tablet, Take 20 mg by mouth daily., Disp: , Rfl:    Fluticasone -Umeclidin-Vilant (TRELEGY ELLIPTA ) 200-62.5-25 MCG/ACT AEPB, Inhale 1 puff into the lungs daily in the afternoon., Disp: 60 each, Rfl: 11   gabapentin (NEURONTIN) 300 MG capsule, Take 300 mg by mouth 2 (two) times daily., Disp: , Rfl:    metoprolol  succinate (TOPROL -XL) 25 MG 24 hr tablet, Take 1 tablet (25 mg total) by mouth daily., Disp: 90 tablet, Rfl: 0   Multiple Vitamin (MULTIVITAMIN) tablet, Take 1 tablet by mouth daily., Disp: , Rfl:    OXYGEN, Inhale 2 L into the lungs at bedtime., Disp: , Rfl:    predniSONE  (DELTASONE ) 20 MG tablet, Take 2 tablets (40 mg total) by mouth daily for 5 days., Disp: 10 tablet, Rfl: 0   simvastatin  (ZOCOR ) 20 MG tablet, Take 20 mg by mouth at bedtime., Disp: , Rfl:    Spacer/Aero-Holding Chambers (AEROCHAMBER MV) inhaler, Use as instructed, Disp: 1 each, Rfl: 2  Current Facility-Administered Medications:    ipratropium-albuterol  (DUONEB) 0.5-2.5 (3) MG/3ML nebulizer solution 3 mL, 3 mL, Nebulization, Q6H PRN, Kasa, Kurian, MD, 3 mL at 06/06/24 1338  "

## 2024-09-11 ENCOUNTER — Ambulatory Visit: Admitting: Emergency Medicine

## 2024-09-11 VITALS — Ht 63.0 in | Wt 175.0 lb

## 2024-09-11 DIAGNOSIS — Z0000 Encounter for general adult medical examination without abnormal findings: Secondary | ICD-10-CM | POA: Diagnosis not present

## 2024-09-11 NOTE — Progress Notes (Signed)
 "  Chief Complaint  Patient presents with   Medicare Wellness     Subjective:   Carol Riley is a 84 y.o. female who presents for a Medicare Annual Wellness Visit.  Visit info / Clinical Intake: Medicare Wellness Visit Type:: Subsequent Annual Wellness Visit Persons participating in visit and providing information:: patient Medicare Wellness Visit Mode:: Telephone If telephone:: video declined Since this visit was completed virtually, some vitals may be partially provided or unavailable. Missing vitals are due to the limitations of the virtual format.: Documented vitals are patient reported If Telephone or Video please confirm:: I connected with patient using audio/video enable telemedicine. I verified patient identity with two identifiers, discussed telehealth limitations, and patient agreed to proceed. Patient Location:: home Provider Location:: clinic office Interpreter Needed?: No Pre-visit prep was completed: yes AWV questionnaire completed by patient prior to visit?: no Living arrangements:: (!) lives alone Patient's Overall Health Status Rating: (!) fair Typical amount of pain: some Does pain affect daily life?: (!) yes Are you currently prescribed opioids?: no  Dietary Habits and Nutritional Risks How many meals a day?: 3 Eats fruit and vegetables daily?: (!) no Most meals are obtained by: preparing own meals; having others provide food (gets meals on wheels for lunch) In the last 2 weeks, have you had any of the following?: none Diabetic:: no  Functional Status Activities of Daily Living (to include ambulation/medication): Independent Ambulation: Independent with device- listed below Home Assistive Devices/Equipment: Cane; Eyeglasses; Oxygen (glasses for reading) Medication Administration: Independent Home Management (perform basic housework or laundry): Independent Manage your own finances?: yes Primary transportation is: facility / other; family / friends  MEDICAL ILLUSTRATOR) Concerns about vision?: no *vision screening is required for WTM* Concerns about hearing?: (!) yes (decreased hearing) Uses hearing aids?: no  Fall Screening Falls in the past year?: 0 Number of falls in past year: 0 Was there an injury with Fall?: 0 Fall Risk Category Calculator: 0 Patient Fall Risk Level: Low Fall Risk  Fall Risk Patient at Risk for Falls Due to: Impaired balance/gait; Impaired mobility Fall risk Follow up: Falls evaluation completed; Education provided; Falls prevention discussed  Home and Transportation Safety: All rugs have non-skid backing?: yes All stairs or steps have railings?: yes Grab bars in the bathtub or shower?: yes Have non-skid surface in bathtub or shower?: yes Good home lighting?: yes Regular seat belt use?: yes Hospital stays in the last year:: no  Cognitive Assessment Difficulty concentrating, remembering, or making decisions? : no Will 6CIT or Mini Cog be Completed: yes What year is it?: 0 points What month is it?: 0 points Give patient an address phrase to remember (5 components): [address] [place] About what time is it?: 0 points Count backwards from 20 to 1: 0 points Say the months of the year in reverse: 0 points Repeat the address phrase from earlier: 0 points 6 CIT Score: 0 points  Advance Directives (For Healthcare) Does Patient Have a Medical Advance Directive?: Yes Does patient want to make changes to medical advance directive?: No - Patient declined Type of Advance Directive: Out of facility DNR (pink MOST or yellow form) Out of facility DNR (pink MOST or yellow form) in Chart? (Ambulatory ONLY): Yes - validated most recent copy scanned in chart  Reviewed/Updated  Reviewed/Updated: Reviewed All (Medical, Surgical, Family, Medications, Allergies, Care Teams, Patient Goals)    Allergies (verified) Pneumovax [pneumococcal polysaccharide vaccine] and Tizanidine   Current Medications (verified) Outpatient  Encounter Medications as of 09/11/2024  Medication Sig   albuterol  (PROVENTIL ) (2.5 MG/3ML) 0.083% nebulizer solution Take 3 mLs (2.5 mg total) by nebulization every 4 (four) hours as needed for wheezing or shortness of breath.   albuterol  (VENTOLIN  HFA) 108 (90 Base) MCG/ACT inhaler Inhale 2 puffs into the lungs every 4 (four) hours as needed.   aspirin EC 81 MG tablet Take 81 mg by mouth daily. Swallow whole.   chlorthalidone (HYGROTON) 25 MG tablet Take 25 mg by mouth daily.   Cholecalciferol (VITAMIN D3 PO) Take 1 tablet by mouth daily.   citalopram (CELEXA) 20 MG tablet Take 20 mg by mouth daily.   Fluticasone -Umeclidin-Vilant (TRELEGY ELLIPTA ) 200-62.5-25 MCG/ACT AEPB Inhale 1 puff into the lungs daily in the afternoon. (Patient taking differently: Inhale 1 puff into the lungs daily in the afternoon. PRN)   gabapentin (NEURONTIN) 300 MG capsule Take 300 mg by mouth 2 (two) times daily.   metoprolol  succinate (TOPROL -XL) 25 MG 24 hr tablet Take 1 tablet (25 mg total) by mouth daily.   Multiple Vitamin (MULTIVITAMIN) tablet Take 1 tablet by mouth daily.   OXYGEN Inhale 2 L into the lungs at bedtime.   predniSONE  (DELTASONE ) 20 MG tablet Take 2 tablets (40 mg total) by mouth daily for 5 days.   simvastatin  (ZOCOR ) 20 MG tablet Take 20 mg by mouth at bedtime.   Spacer/Aero-Holding Chambers (AEROCHAMBER MV) inhaler Use as instructed   Facility-Administered Encounter Medications as of 09/11/2024  Medication   ipratropium-albuterol  (DUONEB) 0.5-2.5 (3) MG/3ML nebulizer solution 3 mL    History: Past Medical History:  Diagnosis Date   Arthritis    Cancer (HCC)    cervical cancer   Cataract cortical, senile    COPD (chronic obstructive pulmonary disease) (HCC)    Coronary artery disease    a. 06/2004 PCI: pLCX and RCA - Cypher DESs; b. 01/2012 Cath: patent stents->med rx; c. 04/2018 MV: mild ant ischemia; d. 01/2024 MV: EF 55-65%, no isch/scar.   Depression    DVT (deep venous thrombosis)  (HCC)    a. prev on warfarin.   Edema    FEET/LEGS   Hyperglycemia    Hypertension    Intraventricular hemorrhage (HCC)    a. 03/2023 - in setting of syncope - managed conservatively - oac d/c.   Leukopenia    Loss of consciousness associated with intracranial injury (HCC) 04/02/2023   Moderate aortic stenosis    a. 04/2018 Echo: EF>55%, mild MR/TR/PR/AS; b. 07/2021 Echo: EF>55%, mod AS; c. 07/2022 Echo: EF>55%, mod AS, mild TR/PR; d. 03/2023 Echo: EF 60-65%, no rwma, mild LVH, GrI DD, nl RV fxn, mild MR, mod AS (mean/peak grad 21.7/37.7 mmHg); e. 12/2023 Echo: EF >55%, GrI DD, Mod AS (peak vel 3.51m/s) mod PR, triv AI/MR/TR.   Neuropathy    Peripheral neuropathy    PSVT (paroxysmal supraventricular tachycardia)    a. 03/2023 Zio: 56 SVT runs, fastest 226 x 9 beats, longest 24.9 secs @ 116 bpm-->bb increased.   Senile purpura    Sleep apnea    Syncope    Thrombocytopenia    Past Surgical History:  Procedure Laterality Date   BACK SURGERY     BREAST BIOPSY Left    benign needle   CHONDROPLASTY Left 05/13/2019   Procedure: CHONDROPLASTY;  Surgeon: Tobie Priest, MD;  Location: [facility] ORS;  Service: Orthopedics;  Laterality: Left;   COLONOSCOPY     COLONOSCOPY WITH PROPOFOL  N/A 02/24/2017   Procedure: COLONOSCOPY WITH PROPOFOL ;  Surgeon: Gaylyn Gladis PENNER, MD;  Location: [facility]  ENDOSCOPY;  Service: Endoscopy;  Laterality: N/A;   CORONARY ANGIOPLASTY     STENT   EYE SURGERY     GAS INSERTION Right 05/06/2015   Procedure: INSERTION OF GAS;  Surgeon: Harlene Scarce, MD;  Location: [facility] ORS;  Service: Ophthalmology;  Laterality: Right;   KNEE ARTHROSCOPY WITH MEDIAL MENISECTOMY Left 05/13/2019   Procedure: KNEE ARTHROSCOPY WITH PARTIAL  MEDIAL and lateral MENISECTOMY;  Surgeon: Tobie Priest, MD;  Location: [facility];  Service: Orthopedics;  Laterality: Left;   MEMBRANE PEEL Right 05/06/2015   Procedure: MEMBRANE PEEL;  Surgeon: Harlene Scarce, MD;  Location: [facility];  Service: Ophthalmology;   Laterality: Right;   PARS PLANA VITRECTOMY Right 05/06/2015   Procedure: PARS PLANA VITRECTOMY WITH 23 GAUGE;  Surgeon: Harlene Scarce, MD;  Location: [facility] OR[facility];  Service: Ophthalmology;  Laterality: Right;   Family History  Problem Relation Age of Onset   Cancer Mother    Breast cancer Sister 49   Social History   Occupational History   Occupation: retired  Tobacco Use   Smoking status: Former    Current packs/day: 0.00    Types: Cigarettes    Quit date: 08/29/1998    Years since quitting: 26.0   Smokeless tobacco: Never   Tobacco comments:    Started smoking at age 78, smoked 1 pack a day.  quit in 2000.   Vaping Use   Vaping status: Never Used  Substance and Sexual Activity   Alcohol use: No   Drug use: No   Sexual activity: Not on file   Tobacco Counseling Counseling given: Not Answered Tobacco comments: Started smoking at age 29, smoked 1 pack a day.  quit in 2000.   SDOH Screenings   Food Insecurity: No Food Insecurity (09/11/2024)  Housing: Low Risk (09/11/2024)  Transportation Needs: No Transportation Needs (09/11/2024)  Utilities: Not At Risk (09/11/2024)  Depression (PHQ2-9): Low Risk (09/11/2024)  Recent Concern: Depression (PHQ2-9) - Medium Risk (08/08/2024)  Physical Activity: Inactive (09/11/2024)  Social Connections: Moderately Integrated (09/11/2024)  Stress: Stress Concern Present (09/11/2024)  Tobacco Use: Medium Risk (09/11/2024)  Health Literacy: Adequate Health Literacy (09/11/2024)   See flowsheets for full screening details  Depression Screen PHQ 2 & 9 Depression Scale- Over the past 2 weeks, how often have you been bothered by any of the following problems? Little interest or pleasure in doing things: 0 Feeling down, depressed, or hopeless (PHQ Adolescent also includes...irritable): 1 PHQ-2 Total Score: 1 Trouble falling or staying asleep, or sleeping too much: 0 Feeling tired or having little energy: 1 Poor appetite or overeating (PHQ Adolescent  also includes...weight loss): 0 Feeling bad about yourself - or that you are a failure or have let yourself or your family down: 0 Trouble concentrating on things, such as reading the newspaper or watching television (PHQ Adolescent also includes...like school work): 0 Moving or speaking so slowly that other people could have noticed. Or the opposite - being so fidgety or restless that you have been moving around a lot more than usual: 0 Thoughts that you would be better off dead, or of hurting yourself in some way: 0 PHQ-9 Total Score: 2 If you checked off any problems, how difficult have these problems made it for you to do your work, take care of things at home, or get along with other people?: Not difficult at all  Depression Treatment Depression Interventions/Treatment : [id number] Score <4 Follow-up Not Indicated     Goals Addressed  This Visit's Progress     Increase physical activity (pt-stated)               Objective:    Today's Vitals   09/11/24 1302  Weight: 175 lb (79.4 kg)  Height: 5' 3 (1.6 m)   Body mass index is 31 kg/m.  Hearing/Vision screen Hearing Screening - Comments:: Some hearing loss Vision Screening - Comments:: UTD @ [facility]  Immunizations and Health Maintenance Health Maintenance  Topic Date Due   Zoster Vaccines- Shingrix (1 of 2) 04/26/1960   Bone Density Scan  Never done   COVID-19 Vaccine (7 - 2025-26 season) 04/29/2024   Medicare Annual Wellness (AWV)  09/11/2025   DTaP/Tdap/Td (3 - Td or Tdap) 07/28/2029   Pneumococcal Vaccine: 50+ Years  Completed   Influenza Vaccine  Completed   Meningococcal B Vaccine  Aged Out        Assessment/Plan:  This is a routine wellness examination for Carol Riley.  Patient Care Team: Everlene Parris LABOR, MD as PCP - General (Family Medicine) Darliss Rogue, MD as Consulting Physician (Cardiology) Isaiah Scrivener, MD as Consulting Physician (Pulmonary Disease) Pa,   [facility] (Optometry) Neill Boas, DPM (Podiatry)  I have personally reviewed and noted the following in the patients chart:   Medical and social history Use of alcohol, tobacco or illicit drugs  Current medications and supplements including opioid prescriptions. Functional ability and status Nutritional status Physical activity Advanced directives List of other physicians Hospitalizations, surgeries, and ER visits in previous 12 months Vitals Screenings to include cognitive, depression, and falls Referrals and appointments  No orders of the defined types were placed in this encounter.  In addition, I have reviewed and discussed with patient certain preventive protocols, quality metrics, and best practice recommendations. A written personalized care plan for preventive services as well as general preventive health recommendations were provided to patient.   Carol Riley, CMA   09/11/2024   Return in 53 weeks (on 09/17/2025) for Medicare Annual Wellness Visit.  After Visit Summary: (MyChart) Due to this being a telephonic visit, the after visit summary with patients personalized plan was offered to patient via MyChart   Nurse Notes:  Needs shingles and covid vaccines (pharmacy) Screening colonoscopy, MMG and DEXA no longer recommended due to age. "

## 2024-09-11 NOTE — Patient Instructions (Signed)
 Ms. Rooke,  Thank you for taking the time for your Medicare Wellness Visit. I appreciate your continued commitment to your health goals. Please review the care plan we discussed, and feel free to reach out if I can assist you further.  Please note that Annual Wellness Visits do not include a physical exam. Some assessments may be limited, especially if the visit was conducted virtually. If needed, we may recommend an in-person follow-up with your provider.  Ongoing Care Seeing your primary care provider every 3 to 6 months helps us  monitor your health and provide consistent, personalized care.   Referrals If a referral was made during today's visit and you haven't received any updates within two weeks, please contact the referred provider directly to check on the status.  Recommended Screenings:  You may get the shingles and covid vaccines at your local pharmacy at your convenience.    Health Maintenance  Topic Date Due   Medicare Annual Wellness Visit  Never done   Zoster (Shingles) Vaccine (1 of 2) 04/26/1960   Osteoporosis screening with Bone Density Scan  Never done   COVID-19 Vaccine (7 - 2025-26 season) 04/29/2024   DTaP/Tdap/Td vaccine (3 - Td or Tdap) 07/28/2029   Pneumococcal Vaccine for age over 16  Completed   Flu Shot  Completed   Meningitis B Vaccine  Aged Out       09/11/2024    1:07 PM  Advanced Directives  Does Patient Have a Medical Advance Directive? Yes  Type of Advance Directive Out of facility DNR (pink MOST or yellow form)  Does patient want to make changes to medical advance directive? No - Patient declined    Vision: Annual vision screenings are recommended for early detection of glaucoma, cataracts, and diabetic retinopathy. These exams can also reveal signs of chronic conditions such as diabetes and high blood pressure.  Dental: Annual dental screenings help detect early signs of oral cancer, gum disease, and other conditions linked to overall health,  including heart disease and diabetes.  Please see the attached documents for additional preventive care recommendations.

## 2024-09-18 ENCOUNTER — Encounter: Payer: Self-pay | Admitting: Internal Medicine

## 2024-09-18 ENCOUNTER — Ambulatory Visit: Admitting: Internal Medicine

## 2024-09-18 VITALS — BP 90/60 | HR 78 | Temp 97.8°F | Ht 62.0 in | Wt 172.6 lb

## 2024-09-18 DIAGNOSIS — J449 Chronic obstructive pulmonary disease, unspecified: Secondary | ICD-10-CM

## 2024-09-18 DIAGNOSIS — J441 Chronic obstructive pulmonary disease with (acute) exacerbation: Secondary | ICD-10-CM | POA: Diagnosis not present

## 2024-09-18 DIAGNOSIS — J9611 Chronic respiratory failure with hypoxia: Secondary | ICD-10-CM

## 2024-09-18 DIAGNOSIS — J411 Mucopurulent chronic bronchitis: Secondary | ICD-10-CM

## 2024-09-18 DIAGNOSIS — Z87891 Personal history of nicotine dependence: Secondary | ICD-10-CM

## 2024-09-18 MED ORDER — IPRATROPIUM-ALBUTEROL 0.5-2.5 (3) MG/3ML IN SOLN
3.0000 mL | Freq: Once | RESPIRATORY_TRACT | Status: AC
  Administered 2024-09-18: 3 mL via RESPIRATORY_TRACT

## 2024-09-18 MED ORDER — IPRATROPIUM-ALBUTEROL 0.5-2.5 (3) MG/3ML IN SOLN: 3.0000 mL | RESPIRATORY_TRACT | 12 refills | Status: AC | PRN

## 2024-09-18 NOTE — Patient Instructions (Signed)
 Plan to give her a DuoNeb treatment in the office today Plan  albuterol /ipratropium  every 4 hours scheduled for the next 2 to 4 days I have advised bedrest for now If symptoms get worse, recommend going to the ER No additional prednisone  at this time

## 2024-09-18 NOTE — Progress Notes (Unsigned)
 " [facility] Consultation      Date: 09/18/2024,   MRN# [id number] Carol Riley Jul 29, 1941  Synopsis 84 year old pleasant female Past medical history consists of of DVT, previously on chronic Coumadin therapy, senile purpura, depression/anxiety, peripheral neuropathy.  Unfortunately had a fall last  year with intracranial hemorrhage. She was taken off anticoagulation at that time. She has had follow-up with neurosurgery, and reports that the recommendation was to continue to stay off blood thinners.  Was taken off Coumadin due to her recurrent falls and intracranial hemorrhage,Was advised not to restart her therapy   TESTS SPIROMETRY: FVC was 1.48 liters, 60% of predicted FEV1 was 1.12, 60% of predicted FEV1 ratio was 75.47 FEF 25-75% liters per second was 64% of predicted  LUNG VOLUMES: TLC was 66% of predicted RV was 79% of predicted DIFFUSION CAPACITY: DLCO was 54% of predicted DLCO/VA was 73% of predicted FLOW VOLUME LOOP: Look more restrictive  Findings reviewed with patient in detail Findings suggest more restriction than obstructive lung disease   CHIEF COMPLAINT:   Follow-up assessment for COPD and chronic bronchitis     HISTORY OF PRESENT ILLNESS  Chronic shortness of breath and dyspnea on exertion since COVID November 2024 Patient has albuterol  responsive Patient had started on Trelegy inhaler therapy  At last office visit several months ago patient had COPD exacerbation Patient was given DuoNeb and Depo-Medrol  shot in the office Patient was prescribed Z-Pak and prednisone  at last office visit Patient was also prescribed incentive spirometry  Currently, patient has persistent wheezing will plan for DuoNeb treatment in the office today Patient had several rounds of prednisone  which have helped but still with persistent wheezing Plan to use albuterol  nebulizers every 4 hours for the next several days on a scheduled regimen I have asked  patient to rest is much as she can for now   PAST MEDICAL HISTORY   Past Medical History:  Diagnosis Date   Arthritis    Cancer (HCC)    cervical cancer   Cataract cortical, senile    COPD (chronic obstructive pulmonary disease) (HCC)    Coronary artery disease    a. 06/2004 PCI: pLCX and RCA - Cypher DESs; b. 01/2012 Cath: patent stents->med rx; c. 04/2018 MV: mild ant ischemia; d. 01/2024 MV: EF 55-65%, no isch/scar.   Depression    DVT (deep venous thrombosis) (HCC)    a. prev on warfarin.   Edema    FEET/LEGS   Hyperglycemia    Hypertension    Intraventricular hemorrhage (HCC)    a. 03/2023 - in setting of syncope - managed conservatively - oac d/c.   Leukopenia    Loss of consciousness associated with intracranial injury (HCC) 04/02/2023   Moderate aortic stenosis    a. 04/2018 Echo: EF>55%, mild MR/TR/PR/AS; b. 07/2021 Echo: EF>55%, mod AS; c. 07/2022 Echo: EF>55%, mod AS, mild TR/PR; d. 03/2023 Echo: EF 60-65%, no rwma, mild LVH, GrI DD, nl RV fxn, mild MR, mod AS (mean/peak grad 21.7/37.7 mmHg); e. 12/2023 Echo: EF >55%, GrI DD, Mod AS (peak vel 3.27m/s) mod PR, triv AI/MR/TR.   Neuropathy    Peripheral neuropathy    PSVT (paroxysmal supraventricular tachycardia)    a. 03/2023 Zio: 56 SVT runs, fastest 226 x 9 beats, longest 24.9 secs @ 116 bpm-->bb increased.   Senile purpura    Sleep apnea    Syncope    Thrombocytopenia      SURGICAL HISTORY   Past Surgical History:  Procedure Laterality Date  BACK SURGERY     BREAST BIOPSY Left    benign needle   CHONDROPLASTY Left 05/13/2019   Procedure: CHONDROPLASTY;  Surgeon: Tobie Priest, MD;  Location: [facility] ORS;  Service: Orthopedics;  Laterality: Left;   COLONOSCOPY     COLONOSCOPY WITH PROPOFOL  N/A 02/24/2017   Procedure: COLONOSCOPY WITH PROPOFOL ;  Surgeon: Gaylyn Gladis PENNER, MD;  Location: [facility] ENDOSCOPY;  Service: Endoscopy;  Laterality: N/A;   CORONARY ANGIOPLASTY     STENT   EYE SURGERY     GAS INSERTION Right  05/06/2015   Procedure: INSERTION OF GAS;  Surgeon: Harlene Scarce, MD;  Location: [facility] OR[facility];  Service: Ophthalmology;  Laterality: Right;   KNEE ARTHROSCOPY WITH MEDIAL MENISECTOMY Left 05/13/2019   Procedure: KNEE ARTHROSCOPY WITH PARTIAL  MEDIAL and lateral MENISECTOMY;  Surgeon: Tobie Priest, MD;  Location: [facility] OR[facility];  Service: Orthopedics;  Laterality: Left;   MEMBRANE PEEL Right 05/06/2015   Procedure: MEMBRANE PEEL;  Surgeon: Harlene Scarce, MD;  Location: [facility];  Service: Ophthalmology;  Laterality: Right;   PARS PLANA VITRECTOMY Right 05/06/2015   Procedure: PARS PLANA VITRECTOMY WITH 23 GAUGE;  Surgeon: Harlene Scarce, MD;  Location: [facility] OR[facility];  Service: Ophthalmology;  Laterality: Right;     FAMILY HISTORY   Family History  Problem Relation Age of Onset   Cancer Mother    Breast cancer Sister 63     SOCIAL HISTORY   Social History   Tobacco Use   Smoking status: Former    Current packs/day: 0.00    Types: Cigarettes    Quit date: 08/29/1998    Years since quitting: 26.0   Smokeless tobacco: Never   Tobacco comments:    Started smoking at age 62, smoked 1 pack a day.  quit in 2000.   Vaping Use   Vaping status: Never Used  Substance Use Topics   Alcohol  use: No   Drug use: No     MEDICATIONS    Home Medication:  Current Outpatient Rx   Order #: [id number] Class: Normal   Order #: [id number] Class: Normal   Order #: [id number] Class: Historical Med   Order #: [id number] Class: Historical Med   Order #: [id number] Class: Historical Med   Order #: [id number] Class: Historical Med   Order #: [id number] Class: Normal   Order #: [id number] Class: Historical Med   Order #: [id number] Class: Normal   Order #: [id number] Class: Historical Med   Order #: [id number] Class: Historical Med   Order #: [id number] Class: Historical Med   Order #: [id number] Class: Normal    Current Medication:  Current Outpatient Medications:    albuterol  (PROVENTIL ) (2.5 MG/3ML) 0.083% nebulizer  solution, Take 3 mLs (2.5 mg total) by nebulization every 4 (four) hours as needed for wheezing or shortness of breath., Disp: 75 mL, Rfl: 2   albuterol  (VENTOLIN  HFA) 108 (90 Base) MCG/ACT inhaler, Inhale 2 puffs into the lungs every 4 (four) hours as needed., Disp: 18 g, Rfl: 0   aspirin EC 81 MG tablet, Take 81 mg by mouth daily. Swallow whole., Disp: , Rfl:    chlorthalidone (HYGROTON) 25 MG tablet, Take 25 mg by mouth daily., Disp: , Rfl:    Cholecalciferol (VITAMIN D3 PO), Take 1 tablet by mouth daily., Disp: , Rfl:    citalopram (CELEXA) 20 MG tablet, Take 20 mg by mouth daily., Disp: , Rfl:    Fluticasone -Umeclidin-Vilant (TRELEGY ELLIPTA ) 200-62.5-25 MCG/ACT AEPB, Inhale 1 puff into the lungs daily in the afternoon. (Patient taking differently: Inhale 1 puff into the lungs daily in the afternoon.  PRN), Disp: 60 each, Rfl: 11   gabapentin (NEURONTIN) 300 MG capsule, Take 300 mg by mouth 2 (two) times daily., Disp: , Rfl:    metoprolol  succinate (TOPROL -XL) 25 MG 24 hr tablet, Take 1 tablet (25 mg total) by mouth daily., Disp: 90 tablet, Rfl: 0   Multiple Vitamin (MULTIVITAMIN) tablet, Take 1 tablet by mouth daily., Disp: , Rfl:    OXYGEN , Inhale 2 L into the lungs at bedtime., Disp: , Rfl:    simvastatin  (ZOCOR ) 20 MG tablet, Take 20 mg by mouth at bedtime., Disp: , Rfl:    Spacer/Aero-Holding Chambers (AEROCHAMBER MV) inhaler, Use as instructed, Disp: 1 each, Rfl: 2  Current Facility-Administered Medications:    ipratropium-albuterol  (DUONEB) 0.5-2.5 (3) MG/3ML nebulizer solution 3 mL, 3 mL, Nebulization, Q6H PRN, Renny Remer, MD, 3 mL at 06/06/24 1338    ALLERGIES   Pneumovax [pneumococcal polysaccharide vaccine] and Tizanidine  BP 90/60   Pulse 78   Temp 97.8 F (36.6 C)   Ht 5' 2 (1.575 m)   Wt 172 lb 9.6 oz (78.3 kg)   SpO2 98%   BMI 31.57 kg/m      Physical Examination:  General Appearance: minimal  distress  EYES EOM intact.   NECK Supple, No JVD Pulmonary:  normal breath sounds, + wheezing.  CardiovascularNormal S1,S2.  No m/r/g.   Ext pulses intact, cap refill intact  ALL OTHER ROS ARE NEGATIVE     ASSESSMENT/PLAN   84 year old pleasant white female seen today for follow-up assessment for acute on chronic bronchitis with COPD exacerbation at last office visit in the setting of deconditioned state obesity with history of restrictive lung disease intermittent reactive airways disease in the setting of chronic hypoxic respiratory failure on oxygen  therapy  Assessment of COPD Ongoing exacerbation with some wheezing however does not require emergency visit Patient feels better with 2 rounds of prednisone , patient feels better with nebulizer treatments but has not taken it in the last 2 days Will plan to give her a DuoNeb treatment in the office today and assess her breathing Plan  albuterol /ipratropium  every 4 hours scheduled for the next 2 to 4 days I have advised bedrest for now Patient does not want any additional prednisone  at this time   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 -Patient benefits from oxygen  therapy 2L [place]  -recommend using oxygen  as prescribed -patient needs this for survival Recommend using oxygen  during her COPD exacerbation  Follow-up cardiology follow-up neurology as scheduled    MEDICATION ADJUSTMENTS/LABS AND TESTS ORDERED: Will give DuoNeb in office today DuoNebs every 4 hours scheduled for the next several days Continue Trelegy inhaler Recommend incentive spirometry which is her breathing to avoid 10-15 times per day-This helps to prevent pneumonia     CURRENT MEDICATIONS REVIEWED AT LENGTH WITH PATIENT TODAY   Patient  satisfied with Plan of action and management. All questions answered   Follow up 1 week   I spent a total of 41 minutes dedicated to the care of this patient on the date of this encounter to include pre-visit review of records, face-to-face time with the patient discussing  conditions above, post visit ordering of testing, clinical documentation with the electronic health record, making appropriate referrals as documented, and communicating necessary information to the patient's healthcare team.    The Patient requires high complexity decision making for assessment and support, frequent evaluation and titration of therapies, application of advanced monitoring technologies and extensive interpretation of multiple databases.  Patient satisfied  with Plan of action and management. All questions answered    Nickolas Alm Cellar, M.D.  [facility]  Medical Director [facility]          "

## 2024-09-19 ENCOUNTER — Ambulatory Visit

## 2024-09-19 VITALS — BP 132/60 | HR 85 | Ht 62.0 in | Wt 173.0 lb

## 2024-09-19 DIAGNOSIS — J449 Chronic obstructive pulmonary disease, unspecified: Secondary | ICD-10-CM | POA: Diagnosis not present

## 2024-09-19 DIAGNOSIS — I10 Essential (primary) hypertension: Secondary | ICD-10-CM | POA: Diagnosis not present

## 2024-09-19 MED ORDER — CIPROFLOXACIN HCL 500 MG PO TABS: 500.0000 mg | ORAL_TABLET | Freq: Two times a day (BID) | ORAL | 0 refills | Status: AC

## 2024-09-19 MED ORDER — PREDNISONE 20 MG PO TABS: 40.0000 mg | ORAL_TABLET | Freq: Every day | ORAL | 0 refills | Status: AC

## 2024-09-19 NOTE — Addendum Note (Signed)
 Addended byBETHA CELLAR, DAVID on: 09/19/2024 12:29 PM   Modules accepted: Level of Service
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
## 2024-09-25 ENCOUNTER — Ambulatory Visit: Admitting: Internal Medicine

## 2024-10-01 ENCOUNTER — Ambulatory Visit: Admitting: Internal Medicine

## 2024-10-01 ENCOUNTER — Telehealth: Payer: Self-pay

## 2024-10-22 ENCOUNTER — Ambulatory Visit: Admitting: Cardiology

## 2024-11-08 ENCOUNTER — Encounter: Admitting: Internal Medicine

## 2024-12-10 ENCOUNTER — Ambulatory Visit: Admitting: Internal Medicine

## 2025-02-13 ENCOUNTER — Encounter: Admitting: Internal Medicine

## 2025-09-17 ENCOUNTER — Ambulatory Visit
